# Patient Record
Sex: Male | Born: 1955 | Race: Black or African American | Hispanic: No | Marital: Married | Smoking: Never smoker
Health system: Southern US, Community
[De-identification: ages and names within clinical notes are randomized; demographics above are authoritative.]

## PROBLEM LIST (undated history)

## (undated) DIAGNOSIS — E538 Deficiency of other specified B group vitamins: Secondary | ICD-10-CM

## (undated) DIAGNOSIS — L80 Vitiligo: Secondary | ICD-10-CM

## (undated) DIAGNOSIS — I429 Cardiomyopathy, unspecified: Secondary | ICD-10-CM

## (undated) DIAGNOSIS — S4492XA Injury of unspecified nerve at shoulder and upper arm level, left arm, initial encounter: Secondary | ICD-10-CM

## (undated) DIAGNOSIS — J111 Influenza due to unidentified influenza virus with other respiratory manifestations: Secondary | ICD-10-CM

## (undated) DIAGNOSIS — E349 Endocrine disorder, unspecified: Secondary | ICD-10-CM

## (undated) DIAGNOSIS — I5020 Unspecified systolic (congestive) heart failure: Secondary | ICD-10-CM

## (undated) DIAGNOSIS — R3129 Other microscopic hematuria: Secondary | ICD-10-CM

## (undated) DIAGNOSIS — K4090 Unilateral inguinal hernia, without obstruction or gangrene, not specified as recurrent: Secondary | ICD-10-CM

## (undated) DIAGNOSIS — I639 Cerebral infarction, unspecified: Secondary | ICD-10-CM

## (undated) DIAGNOSIS — I10 Essential (primary) hypertension: Secondary | ICD-10-CM

## (undated) DIAGNOSIS — I5022 Chronic systolic (congestive) heart failure: Secondary | ICD-10-CM

## (undated) DIAGNOSIS — R319 Hematuria, unspecified: Secondary | ICD-10-CM

## (undated) DIAGNOSIS — I2510 Atherosclerotic heart disease of native coronary artery without angina pectoris: Secondary | ICD-10-CM

## (undated) DIAGNOSIS — I8390 Asymptomatic varicose veins of unspecified lower extremity: Secondary | ICD-10-CM

## (undated) DIAGNOSIS — Z7982 Long term (current) use of aspirin: Secondary | ICD-10-CM

## (undated) DIAGNOSIS — R001 Bradycardia, unspecified: Secondary | ICD-10-CM

## (undated) DIAGNOSIS — E785 Hyperlipidemia, unspecified: Secondary | ICD-10-CM

## (undated) DIAGNOSIS — K640 First degree hemorrhoids: Secondary | ICD-10-CM

## (undated) DIAGNOSIS — N529 Male erectile dysfunction, unspecified: Secondary | ICD-10-CM

## (undated) DIAGNOSIS — N200 Calculus of kidney: Secondary | ICD-10-CM

## (undated) DIAGNOSIS — Z87442 Personal history of urinary calculi: Secondary | ICD-10-CM

## (undated) DIAGNOSIS — I83892 Varicose veins of left lower extremities with other complications: Secondary | ICD-10-CM

## (undated) DIAGNOSIS — D72819 Decreased white blood cell count, unspecified: Secondary | ICD-10-CM

## (undated) DIAGNOSIS — I2089 Other forms of angina pectoris: Secondary | ICD-10-CM

## (undated) DIAGNOSIS — G4700 Insomnia, unspecified: Secondary | ICD-10-CM

## (undated) HISTORY — PX: COLONOSCOPY: SHX174

## (undated) HISTORY — PX: INGUINAL HERNIA REPAIR: SUR1180

## (undated) HISTORY — PX: HERNIA REPAIR: SHX51

---

## 2007-05-17 ENCOUNTER — Emergency Department: Payer: Self-pay | Admitting: Internal Medicine

## 2013-11-03 ENCOUNTER — Ambulatory Visit: Payer: Self-pay | Admitting: Surgery

## 2015-02-19 NOTE — Op Note (Signed)
PATIENT NAME:  LAWSON, MAHONE MR#:  [id number] DATE OF BIRTH:  06-06-1956  DATE OF PROCEDURE:  11/03/2013  PREOPERATIVE DIAGNOSIS: Left inguinal hernia.   POSTOPERATIVE DIAGNOSIS: Left inguinal hernia.   PROCEDURE: Left inguinal hernia repair.   SURGEON: Rochel Brome, M.D.   ANESTHESIA: General.   INDICATIONS: This is a 59 year old male who has recently had bulging in the left groin. A left inguinal hernia was demonstrated on physical exam.   The patient was placed on the operating table in the supine position under general anesthesia. The left lower quadrant of the abdomen was clipped and was prepared with ChloraPrep and draped in a sterile manner.   A transversely oriented left lower quadrant suprapubic incision was made, carried down through subcutaneous tissues. One traversing vein was clamped and ligated with 4-0 chromic. Next, further dissection was carried out with electrocautery. Several small bleeding points were cauterized. The Scarpa's fascia was incised. The external ring was identified. The external oblique aponeurosis was incised along the course of its fibers to open the external ring and expose the inguinal cord structures. The ilioinguinal nerve was seen coursing medially and was not in the way of the repair. Cord structures were mobilized. Cremaster fibers were spread to expose an indirect hernia sac which was approximately 7 cm in length and was dissected free from surrounding structures up into the internal ring. The sac was opened. Its continuity with the peritoneal cavity was demonstrated. A high ligation of the sac was done with a 0 Surgilon suture ligature. The sac was excised. The stump was allowed to retract. Next, repair was carried out with a row of 0 Surgilon sutures beginning at the pubic tubercle and carrying this up to the internal ring suturing the conjoined tendon to the shelving edge of the inguinal ligament incorporating transversalis fascia into the repair. The last  stitch led to satisfactory narrowing of the internal ring. Next, an onlay Bard soft mesh was cut to create an oval shape of some 2.5 x 3.5 cm with a notch cut out to straddle the internal ring. This was placed over the repair and sutured on both sides of the internal ring, sutured to the repair and also sutured medially to the fascia. The repair looked good. Hemostasis was intact. The ilioinguinal nerve was again recognized coursing medially. Next, the cut edges of the external oblique aponeurosis were closed with a running 4-0 Vicryl re-creating the external ring. The deep fascia superior and lateral to the repair site was infiltrated with 0.5% Sensorcaine with epinephrine. Subcutaneous tissues were infiltrated as well. There was at this point some bleeding from the vein, which was ligated at the beginning of the case and this was further suture ligated with 4-0 chromic and bleeding resolved. The subcutaneous tissues were also infiltrated with 0.5% Sensorcaine with epinephrine using a total of 15 mL the skin was closed with a running 4-0 Monocryl subcuticular suture and Dermabond. The patient tolerated surgery satisfactorily and was then prepared for transfer to the recovery room.     ____________________________ Lenna Sciara. Rochel Brome, MD [email or web address] D: 11/03/2013 09:00:26 ET T: 11/03/2013 09:28:55 ET JOB#: [id number]  cc: Loreli Dollar, MD, <Dictator> Loreli Dollar MD ELECTRONICALLY SIGNED 11/05/2013 19:51

## 2015-05-24 ENCOUNTER — Encounter: Payer: Self-pay | Admitting: *Deleted

## 2015-05-24 ENCOUNTER — Emergency Department: Payer: BLUE CROSS/BLUE SHIELD

## 2015-05-24 ENCOUNTER — Emergency Department
Admission: EM | Admit: 2015-05-24 | Discharge: 2015-05-24 | Disposition: A | Discharge status: Home / Self Care | Payer: BLUE CROSS/BLUE SHIELD | Attending: Emergency Medicine | Admitting: Emergency Medicine

## 2015-05-24 DIAGNOSIS — Y9389 Activity, other specified: Secondary | ICD-10-CM | POA: Insufficient documentation

## 2015-05-24 DIAGNOSIS — S0990XA Unspecified injury of head, initial encounter: Secondary | ICD-10-CM | POA: Insufficient documentation

## 2015-05-24 DIAGNOSIS — Y998 Other external cause status: Secondary | ICD-10-CM | POA: Diagnosis not present

## 2015-05-24 DIAGNOSIS — Y92410 Unspecified street and highway as the place of occurrence of the external cause: Secondary | ICD-10-CM | POA: Diagnosis not present

## 2015-05-24 DIAGNOSIS — S0093XA Contusion of unspecified part of head, initial encounter: Secondary | ICD-10-CM

## 2015-05-24 HISTORY — DX: Calculus of kidney: N20.0
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
## 2015-05-24 MED ORDER — IBUPROFEN 800 MG PO TABS: 800.0000 mg | ORAL_TABLET | Freq: Three times a day (TID) | ORAL | Status: DC | PRN

## 2015-05-24 MED ORDER — CYCLOBENZAPRINE HCL 10 MG PO TABS: 10.0000 mg | ORAL_TABLET | Freq: Three times a day (TID) | ORAL | Status: DC | PRN
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
## 2015-05-24 NOTE — ED Notes (Signed)
Patient with no complaints at this time. Respirations even and unlabored. Skin warm/dry. Discharge instructions reviewed with patient at this time. Patient given opportunity to voice concerns/ask questions. Patient discharged at this time and left Emergency Department with steady gait.   
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
## 2015-08-24 ENCOUNTER — Other Ambulatory Visit: Payer: Self-pay | Admitting: Internal Medicine

## 2015-08-24 DIAGNOSIS — R1031 Right lower quadrant pain: Secondary | ICD-10-CM

## 2015-08-30 ENCOUNTER — Ambulatory Visit
Admission: RE | Admit: 2015-08-30 | Discharge: 2015-08-30 | Disposition: A | Discharge status: Home / Self Care | Payer: BLUE CROSS/BLUE SHIELD | Source: Ambulatory Visit | Attending: Internal Medicine | Admitting: Internal Medicine

## 2015-08-30 DIAGNOSIS — R1031 Right lower quadrant pain: Secondary | ICD-10-CM

## 2015-08-30 DIAGNOSIS — K4090 Unilateral inguinal hernia, without obstruction or gangrene, not specified as recurrent: Secondary | ICD-10-CM | POA: Diagnosis not present

## 2015-08-30 IMAGING — CT CT ABD-PELV W/ CM
2 of 5 series · 16 of 46 positions shown, 18 images · IV contrast (omnipaque)
Comparison: None.

CLINICAL DATA: Right lower quadrant soreness x6 weeks, abdominal
bulge to right of umbilicus (worse with Valsalva), history of left
inguinal hernia repair

EXAM:
CT ABDOMEN AND PELVIS WITH CONTRAST
TECHNIQUE: Multidetector CT imaging of the abdomen and pelvis was performed
using the standard protocol following bolus administration of
intravenous contrast.
CONTRAST:  100mL OMNIPAQUE IOHEXOL 300 MG/ML  SOLN

[Series 2: routine with · axial · 0.75mm/px · z∈[-873,-478]mm · 13 of 89 slices shown, 15 images]
[im 5/89  soft-tissue]
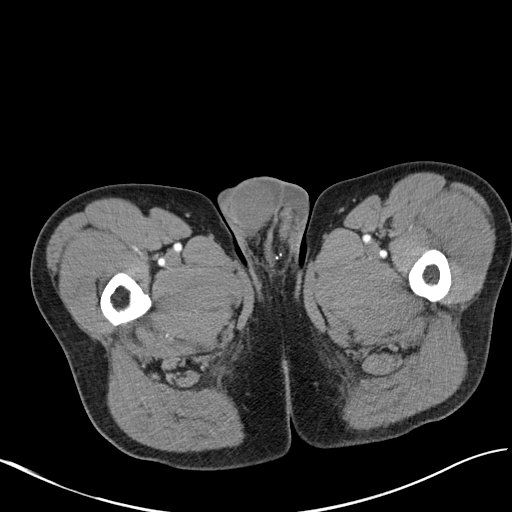
[im 5/89  bone]
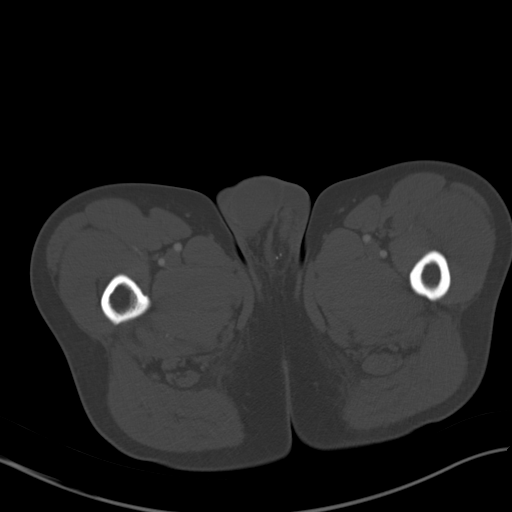
[im 14/89  soft-tissue]
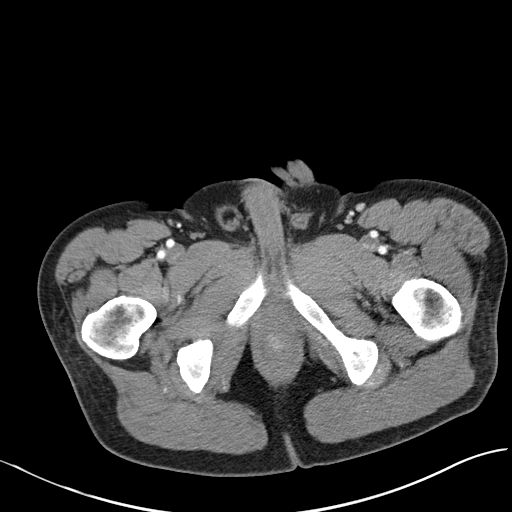
[im 19/89  soft-tissue]
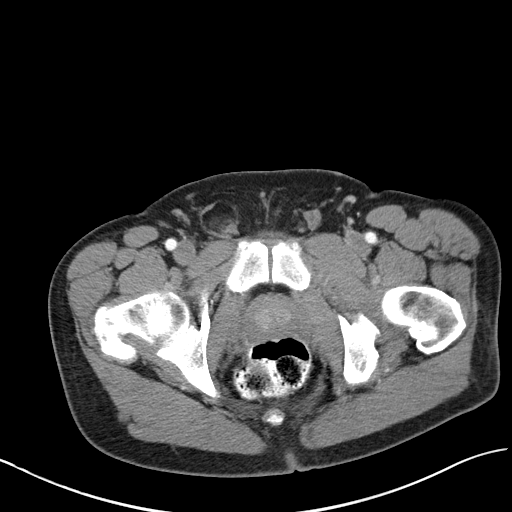
[im 24/89  soft-tissue]
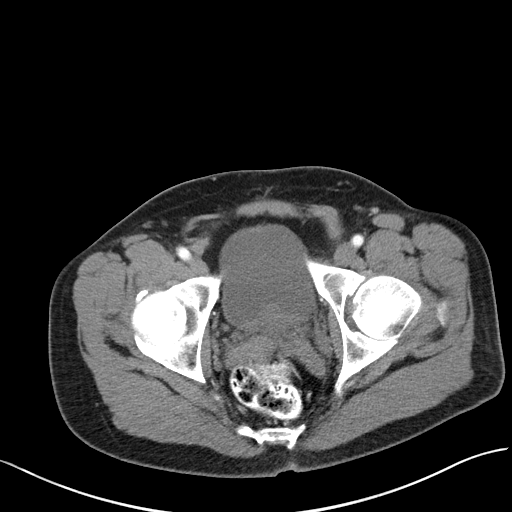
[im 33/89  soft-tissue]
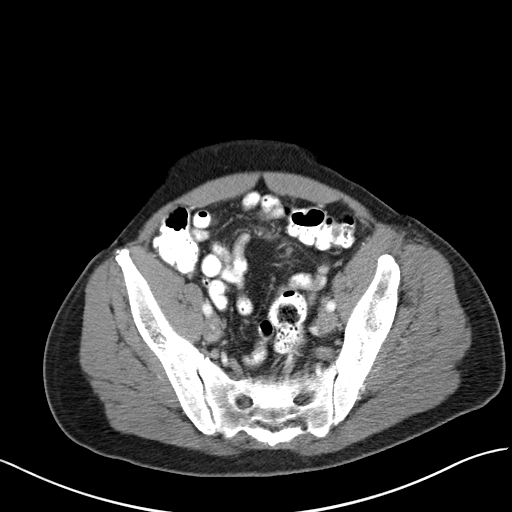
[im 38/89  soft-tissue]
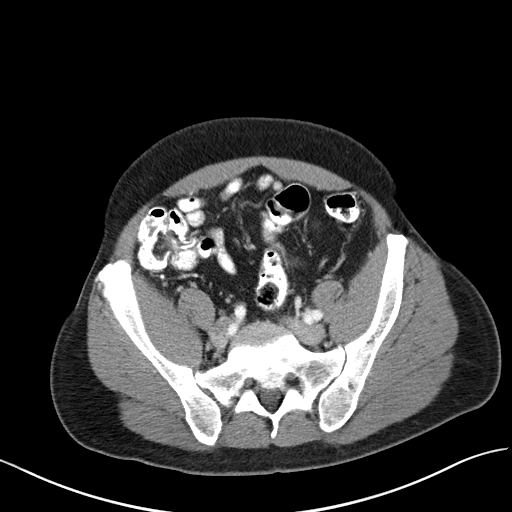
[im 47/89  soft-tissue]
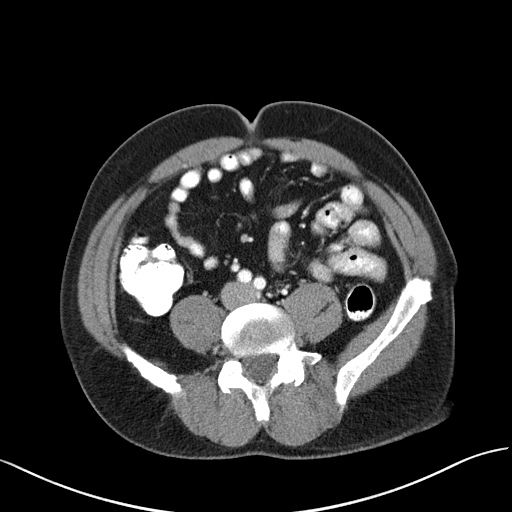
[im 51/89  soft-tissue]
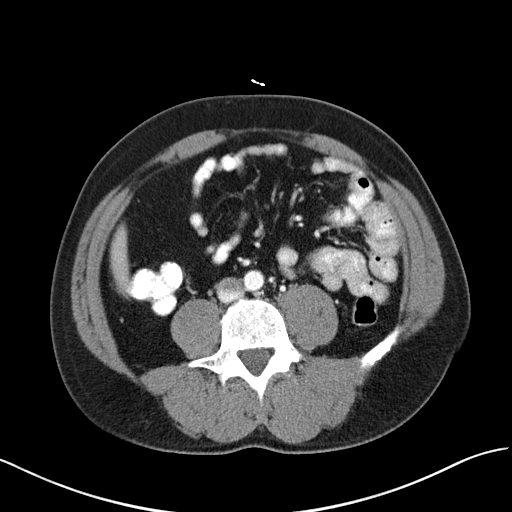
[im 56/89  soft-tissue]
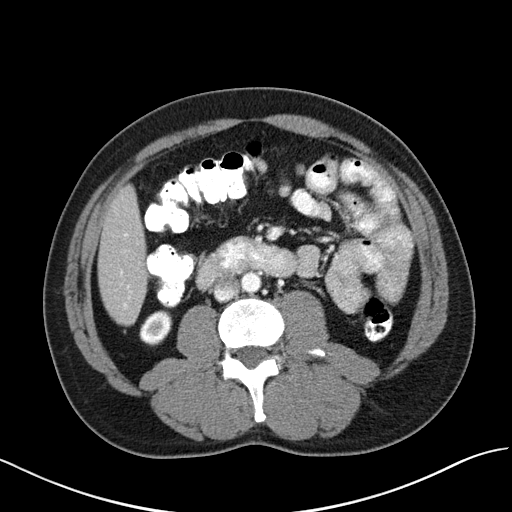
[im 56/89  bone]
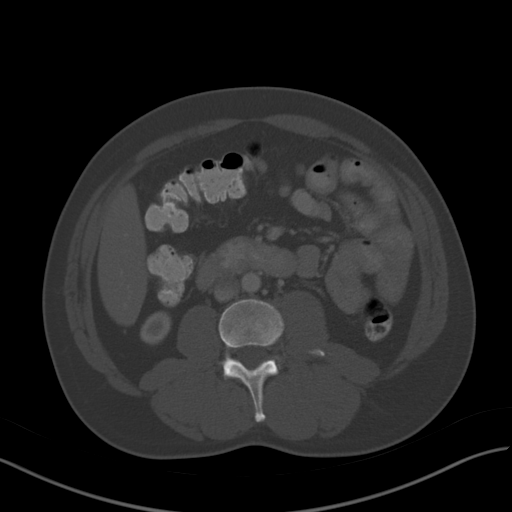
[im 65/89  soft-tissue]
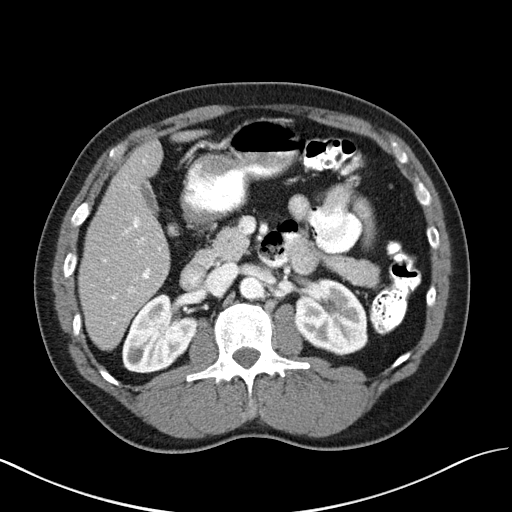
[im 70/89  soft-tissue]
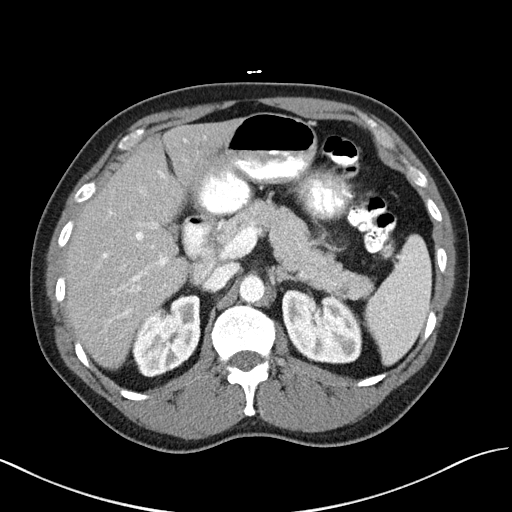
[im 75/89  soft-tissue]
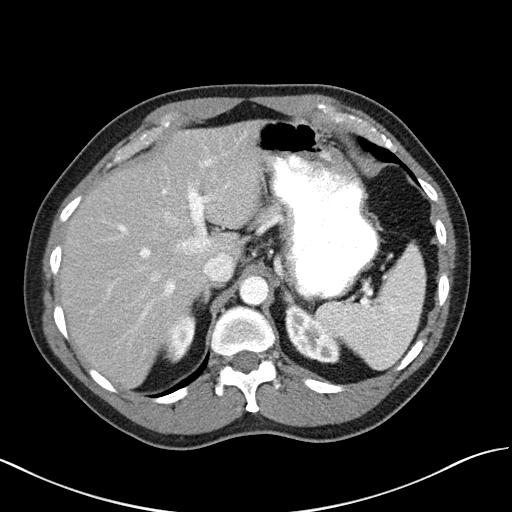
[im 84/89  soft-tissue]
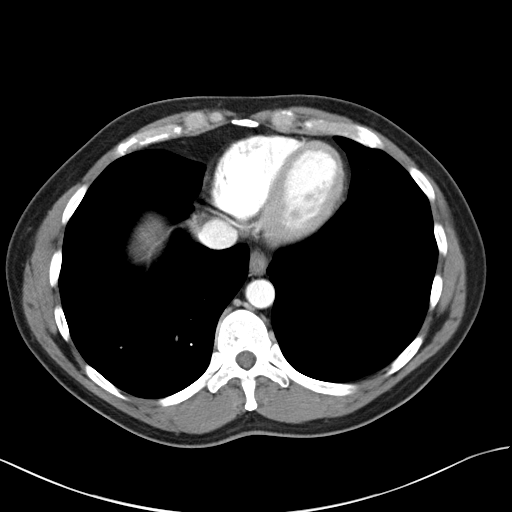

[Series 6: cor routine with · coronal · 0.76mm/px · 3 of 158 slices shown]
[im 53/158  soft-tissue]
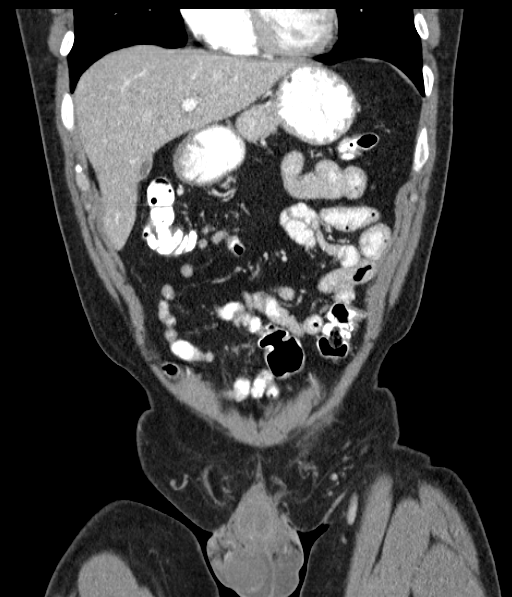
[im 70/158  soft-tissue]
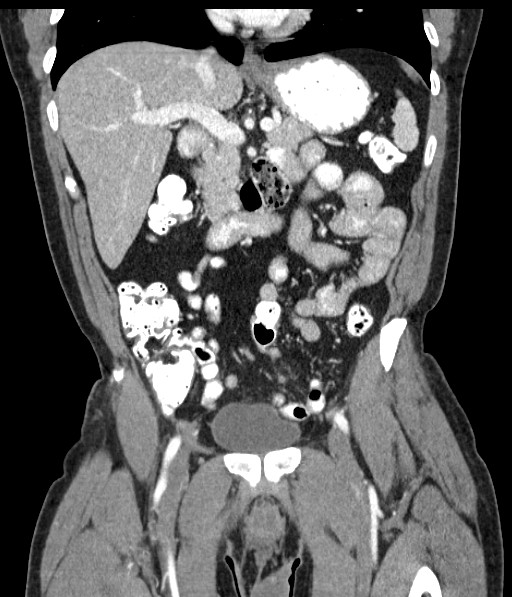
[im 88/158  soft-tissue]
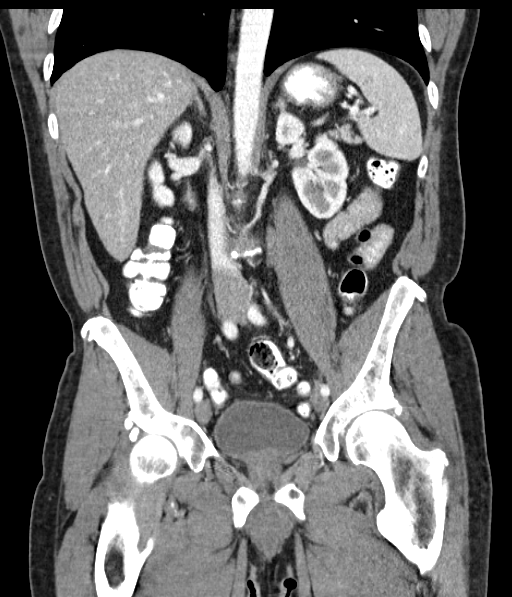

[16 of 46 positions shown; findings below may reference images not displayed]

FINDINGS: Lower chest:  Lung bases are clear.

Hepatobiliary: Liver is within normal limits.

Gallbladder is underdistended. No intrahepatic or extrahepatic
ductal dilatation.

Pancreas: Within normal limits.

Spleen: Within normal limits.

Adrenals/Urinary Tract: Adrenal glands are within normal limits.

12 mm cyst in the posterior interpolar right kidney (series 4/ image
18). Kidneys are otherwise within normal limits. No hydronephrosis.

Bladder is within normal limits.

Stomach/Bowel: Stomach is within normal limits.

No evidence of bowel obstruction.

Normal appendix.

Vascular/Lymphatic: No evidence of abdominal aortic aneurysm.

No suspicious abdominopelvic lymphadenopathy.

Reproductive: Prostate is unremarkable.

Other: No abdominopelvic ascites.

Small fat containing right inguinal hernia (series 2/ image 32).

Postsurgical changes related to prior left inguinal hernia repair.

Musculoskeletal: Visualized osseous structures are within normal
limits.
IMPRESSION: No evidence of bowel obstruction.  Normal appendix.

Small fat containing right inguinal hernia.

## 2015-08-30 MED ORDER — IOHEXOL 300 MG/ML  SOLN
100.0000 mL | Freq: Once | INTRAMUSCULAR | Status: AC | PRN
  Administered 2015-08-30: 100 mL via INTRAVENOUS

## 2015-09-02 DIAGNOSIS — K4090 Unilateral inguinal hernia, without obstruction or gangrene, not specified as recurrent: Secondary | ICD-10-CM

## 2015-09-02 HISTORY — DX: Unilateral inguinal hernia, without obstruction or gangrene, not specified as recurrent: K40.90

## 2015-09-29 DIAGNOSIS — J111 Influenza due to unidentified influenza virus with other respiratory manifestations: Secondary | ICD-10-CM

## 2015-09-29 HISTORY — DX: Influenza due to unidentified influenza virus with other respiratory manifestations: J11.1

## 2015-10-18 ENCOUNTER — Inpatient Hospital Stay: Admission: RE | Admit: 2015-10-18 | Payer: BLUE CROSS/BLUE SHIELD | Source: Ambulatory Visit

## 2015-10-18 NOTE — Patient Instructions (Signed)
  Your procedure is scheduled on: Friday Dec. 30, 2016. Report to Same Day Surgery. To find out your arrival time please call [phone] between 1PM - 3PM on Thursday Dec. 29, 2016 .  Remember: Instructions that are not followed completely may result in serious medical risk, up to and including death, or upon the discretion of your surgeon and anesthesiologist your surgery may need to be rescheduled.    _x___ 1. Do not eat food or drink liquids after midnight. No gum chewing or hard candies.     ____ 2. No Alcohol for 24 hours before or after surgery.   ____ 3. Bring all medications with you on the day of surgery if instructed.    __x__ 4. Notify your doctor if there is any change in your medical condition     (cold, fever, infections).     Do not wear jewelry, make-up, hairpins, clips or nail polish.  Do not wear lotions, powders, or perfumes. You may wear deodorant.  Do not shave 48 hours prior to surgery. Men may shave face and neck.  Do not bring valuables to the hospital.    [facility] is not responsible for any belongings or valuables.               Contacts, dentures or bridgework may not be worn into surgery.  Leave your suitcase in the car. After surgery it may be brought to your room.  For patients admitted to the hospital, discharge time is determined by your treatment team.   Patients discharged the day of surgery will not be allowed to drive home.    Please read over the following fact sheets that you were given:   [facility] [place] Preparing for Surgery  ____ Take these medicines the morning of surgery with A SIP OF WATER: NONE     ____ Fleet Enema (as directed)   ____ Use CHG Soap as directed  ____ Use inhalers on the day of surgery  ____ Stop metformin 2 days prior to surgery    ____ Take 1/2 of usual insulin dose the night before surgery and none on the morning of surgery.   ____ Stop Coumadin/Plavix/aspirin on does not apply.  _x___ Stop  Anti-inflammatories now until after surgery.  Tylenol is OK to take for pain.   ____ Stop supplements until after surgery.    ____ Bring C-Pap to the hospital.

## 2015-10-28 ENCOUNTER — Encounter: Admission: RE | Payer: Self-pay | Source: Ambulatory Visit

## 2015-10-28 ENCOUNTER — Ambulatory Visit: Admission: RE | Admit: 2015-10-28 | Payer: BLUE CROSS/BLUE SHIELD | Source: Ambulatory Visit | Admitting: Surgery

## 2015-10-28 HISTORY — DX: Unilateral inguinal hernia, without obstruction or gangrene, not specified as recurrent: K40.90

## 2015-10-28 HISTORY — DX: Hyperlipidemia, unspecified: E78.5

## 2015-10-28 SURGERY — REPAIR, HERNIA, INGUINAL, ADULT
Anesthesia: Choice | Laterality: Right

## 2015-11-04 ENCOUNTER — Other Ambulatory Visit: Payer: BLUE CROSS/BLUE SHIELD

## 2015-11-16 ENCOUNTER — Encounter: Payer: Self-pay | Admitting: *Deleted

## 2015-11-16 ENCOUNTER — Inpatient Hospital Stay: Admission: RE | Admit: 2015-11-16 | Payer: BLUE CROSS/BLUE SHIELD | Source: Ambulatory Visit

## 2015-12-23 ENCOUNTER — Ambulatory Visit
Admission: RE | Admit: 2015-12-23 | Discharge: 2015-12-23 | Disposition: A | Discharge status: Home / Self Care | Payer: BLUE CROSS/BLUE SHIELD | Source: Ambulatory Visit | Attending: Surgery | Admitting: Surgery

## 2015-12-23 ENCOUNTER — Encounter: Payer: Self-pay | Admitting: *Deleted

## 2015-12-23 ENCOUNTER — Encounter
Admission: RE | Disposition: A | Discharge status: Home / Self Care | Payer: Self-pay | Source: Ambulatory Visit | Attending: Surgery

## 2015-12-23 ENCOUNTER — Ambulatory Visit: Payer: BLUE CROSS/BLUE SHIELD | Admitting: Anesthesiology

## 2015-12-23 DIAGNOSIS — D176 Benign lipomatous neoplasm of spermatic cord: Secondary | ICD-10-CM | POA: Diagnosis not present

## 2015-12-23 DIAGNOSIS — K4090 Unilateral inguinal hernia, without obstruction or gangrene, not specified as recurrent: Secondary | ICD-10-CM | POA: Insufficient documentation

## 2015-12-23 DIAGNOSIS — E785 Hyperlipidemia, unspecified: Secondary | ICD-10-CM | POA: Diagnosis not present

## 2015-12-23 HISTORY — DX: Influenza due to unidentified influenza virus with other respiratory manifestations: J11.1

## 2015-12-23 HISTORY — PX: INGUINAL HERNIA REPAIR: SHX194

## 2015-12-23 SURGERY — REPAIR, HERNIA, INGUINAL, ADULT
Anesthesia: General | Site: Groin | Laterality: Right | Wound class: Clean

## 2015-12-23 MED ORDER — FAMOTIDINE 20 MG PO TABS
20.0000 mg | ORAL_TABLET | Freq: Once | ORAL | Status: AC
  Administered 2015-12-23: 20 mg via ORAL

## 2015-12-23 MED ORDER — BUPIVACAINE-EPINEPHRINE (PF) 0.5% -1:200000 IJ SOLN
INTRAMUSCULAR | Status: DC | PRN
Start: 2015-12-23 — End: 2015-12-23
  Administered 2015-12-23: 15 mL

## 2015-12-23 MED ORDER — HYDROCODONE-ACETAMINOPHEN 5-325 MG PO TABS: 1.0000 | ORAL_TABLET | ORAL | Status: DC | PRN

## 2015-12-23 MED ORDER — ONDANSETRON HCL 4 MG/2ML IJ SOLN
INTRAMUSCULAR | Status: DC | PRN
  Administered 2015-12-23: 4 mg via INTRAVENOUS

## 2015-12-23 MED ORDER — LIDOCAINE HCL (CARDIAC) 20 MG/ML IV SOLN
INTRAVENOUS | Status: DC | PRN
  Administered 2015-12-23: 30 mg via INTRAVENOUS

## 2015-12-23 MED ORDER — PROPOFOL 10 MG/ML IV BOLUS
INTRAVENOUS | Status: DC | PRN
  Administered 2015-12-23: 200 mg via INTRAVENOUS

## 2015-12-23 MED ORDER — ROCURONIUM BROMIDE 100 MG/10ML IV SOLN
INTRAVENOUS | Status: DC | PRN
  Administered 2015-12-23: 40 mg via INTRAVENOUS

## 2015-12-23 MED ORDER — FENTANYL CITRATE (PF) 100 MCG/2ML IJ SOLN
INTRAMUSCULAR | Status: DC | PRN
  Administered 2015-12-23 (×2): 100 ug via INTRAVENOUS

## 2015-12-23 MED ORDER — SUGAMMADEX SODIUM 200 MG/2ML IV SOLN
INTRAVENOUS | Status: DC | PRN
  Administered 2015-12-23: 158.8 mg via INTRAVENOUS

## 2015-12-23 MED ORDER — MIDAZOLAM HCL 2 MG/2ML IJ SOLN
INTRAMUSCULAR | Status: DC | PRN
  Administered 2015-12-23: 2 mg via INTRAVENOUS

## 2015-12-23 MED ORDER — CEFAZOLIN SODIUM-DEXTROSE 2-3 GM-% IV SOLR
2.0000 g | Freq: Once | INTRAVENOUS | Status: AC
  Administered 2015-12-23: 2 g via INTRAVENOUS

## 2015-12-23 MED ORDER — ONDANSETRON HCL 4 MG/2ML IJ SOLN: 4.0000 mg | Freq: Once | INTRAMUSCULAR | Status: DC | PRN

## 2015-12-23 MED ORDER — DEXAMETHASONE SODIUM PHOSPHATE 10 MG/ML IJ SOLN
INTRAMUSCULAR | Status: DC | PRN
  Administered 2015-12-23: 4 mg via INTRAVENOUS

## 2015-12-23 MED ORDER — LACTATED RINGERS IV SOLN
INTRAVENOUS | Status: DC
  Administered 2015-12-23 (×3): via INTRAVENOUS

## 2015-12-23 MED ORDER — FENTANYL CITRATE (PF) 100 MCG/2ML IJ SOLN
25.0000 ug | INTRAMUSCULAR | Status: DC | PRN
  Administered 2015-12-23 (×3): 25 ug via INTRAVENOUS

## 2015-12-23 MED ORDER — FENTANYL CITRATE (PF) 100 MCG/2ML IJ SOLN
INTRAMUSCULAR | Status: AC
  Filled 2015-12-23: qty 2

## 2015-12-23 SURGICAL SUPPLY — 25 items
BLADE SURG 15 STRL LF DISP TIS (BLADE) ×1 IMPLANT
BLADE SURG 15 STRL SS (BLADE) ×2
CANISTER SUCT 1200ML W/VALVE (MISCELLANEOUS) ×3 IMPLANT
CHLORAPREP W/TINT 26ML (MISCELLANEOUS) ×3 IMPLANT
DRAIN PENROSE 5/8X18 LTX STRL (WOUND CARE) ×3 IMPLANT
DRAPE LAPAROTOMY 77X122 PED (DRAPES) ×3 IMPLANT
ELECT REM PT RETURN 9FT ADLT (ELECTROSURGICAL) ×3
ELECTRODE REM PT RTRN 9FT ADLT (ELECTROSURGICAL) ×1 IMPLANT
GLOVE BIO SURGEON STRL SZ7.5 (GLOVE) ×9 IMPLANT
GOWN STRL REUS W/ TWL LRG LVL3 (GOWN DISPOSABLE) ×3 IMPLANT
GOWN STRL REUS W/TWL LRG LVL3 (GOWN DISPOSABLE) ×6
KIT RM TURNOVER STRD PROC AR (KITS) ×3 IMPLANT
LABEL OR SOLS (LABEL) IMPLANT
LIQUID BAND (GAUZE/BANDAGES/DRESSINGS) ×3 IMPLANT
MESH SYNTHETIC 4X6 SOFT BARD (Mesh General) ×1 IMPLANT
MESH SYNTHETIC SOFT BARD 4X6 (Mesh General) ×2 IMPLANT
NEEDLE HYPO 25X1 1.5 SAFETY (NEEDLE) ×3 IMPLANT
NS IRRIG 500ML POUR BTL (IV SOLUTION) ×3 IMPLANT
PACK BASIN MINOR ARMC (MISCELLANEOUS) ×3 IMPLANT
SUT CHROMIC 4 0 RB 1X27 (SUTURE) ×3 IMPLANT
SUT MNCRL AB 4-0 PS2 18 (SUTURE) IMPLANT
SUT SURGILON 0 30 BLK (SUTURE) ×6 IMPLANT
SUT VIC AB 4-0 SH 27 (SUTURE) ×4
SUT VIC AB 4-0 SH 27XANBCTRL (SUTURE) ×2 IMPLANT
SYRINGE 10CC LL (SYRINGE) ×3 IMPLANT

## 2015-12-23 NOTE — Transfer of Care (Signed)
Immediate Anesthesia Transfer of Care Note  Patient: Jimmy Montoya  Procedure(s) Performed: Procedure(s): HERNIA REPAIR INGUINAL ADULT (Right)  Patient Location: PACU  Anesthesia Type:General  Level of Consciousness: sedated  Airway & Oxygen Therapy: Patient Spontanous Breathing and Patient connected to face mask oxygen  Post-op Assessment: Report given to RN and Post -op Vital signs reviewed and stable  Post vital signs: Reviewed and stable  Last Vitals:  Filed Vitals:   12/23/15 1300  BP: 133/90  Pulse: 79  Temp: 36.7 C  Resp: 16    Complications: No apparent anesthesia complications

## 2015-12-23 NOTE — H&P (Signed)
Jimmy Montoya is an 60 y.o. male.   Chief Complaint: Bulging in the right groin HPI: He reports several months history of discomfort in the right groin with bulging. CT scan demonstrated fat-containing right inguinal hernia.  Past Medical History  Diagnosis Date  . Hernia, inguinal, right 09/02/2015  . Hyperlipemia   . Kidney stone   . Flu DEC 2016    Past Surgical History  Procedure Laterality Date  . Hernia repair      History reviewed. No pertinent family history. Social History:  reports that he has never smoked. He does not have any smokeless tobacco history on file. He reports that he does not drink alcohol or use illicit drugs.  Allergies: No Known Allergies  Medications Prior to Admission  Medication Sig Dispense Refill  . DM-Doxylamine-Acetaminophen (NYQUIL COLD & FLU PO) Take by mouth as needed.      No results found for this or any previous visit (from the past 48 hour(s)). No results found.  ROS he had recent flu and cold and his surgery was delayed. He reports his symptoms have resolved.  Blood pressure 133/90, pulse 79, temperature 98.1 F (36.7 C), temperature source Oral, resp. rate 16, height 5\' 7"  (1.702 m), weight 79.379 kg (175 lb), SpO2 99 %. Physical Exam  GENERAL:  Awake alert and oriented and in no acute distress.  HEENT:  Head is normocephalic.  Pupils are equal reactive to light.  Extraocular movements are intact. Sclera is clear.  Pharynx is clear.  LUNGS:  Clear without rales rhonchi or wheezes.  HEART:  Regular rhythm S1-S2, without murmur.  ABDOMEN: The site of the right inguinal hernia was demonstrated in the right side was marked YES   NEUROLOGICAL: Awake alert and oriented and moving all extremities  EXTREMITIES: No dependent edema.  Assessment/Plan Right inguinal hernia  I discussed the plan for right inguinal hernia repair  Rochel Brome, MD 12/23/2015, 2:05 PM   2

## 2015-12-23 NOTE — Op Note (Signed)
OPERATIVE REPORT  PREOPERATIVE DIAGNOSIS: right inguinal hernia  POSTOPERATIVE DIAGNOSIS:right  inguinal hernia  PROCEDURE:  right inguinal hernia repair  ANESTHESIA:  General  SURGEON:  Rochel Brome M.D.  INDICATIONS: He has a history of bulging and moderate discomfort in the right groin. A right inguinal hernia was demonstrated on CT and also demonstrated on physical exam and repair was recommended for definitive treatment.  With the patient on the operating table in the supine position the right lower quadrant was prepared with clippers and with ChloraPrep and draped in a sterile manner. A transversely oriented suprapubic incision was made and carried down through subcutaneous tissues. Electrocautery was used for hemostasis. The Scarpa's fascia was incised. The external oblique aponeurosis was incised along the course of its fibers to open the external ring and expose the inguinal cord structures. The cord structures were mobilized. A Penrose drain was passed around the cord structures for traction. Cremaster fibers were separated to expose an indirect hernia sac which was some 4 cm in length and was dissected free from surrounding structures up to the internal ring. A high ligation of the sac was done with 4-0 Vicryl suture ligature the sac was amputated and the stump allowed to retract. A cord lipoma was also dissected free from surrounding structures up into the internal ring where it was ligated doubly with 4-0 Vicryl and amputated. Tissues were not submitted for pathology.  Bard soft mesh was cut to create an oval shape and was placed over the repair. This was sutured to the repair with interrupted 0 Surgilon sutures and also sutured medially to the deep fascia and on both sides of the internal ring. Next after seeing hemostasis was intact the cord structures were replaced along the floor of the inguinal canal. The cut edges of the external oblique aponeurosis were closed with a running 4-0  Vicryl suture to re-create the external ring. The deep fascia superior and lateral to the repair site was infiltrated with half percent Sensorcaine with epinephrine. Subcutaneous tissues were also infiltrated. The Scarpa's fascia was closed with interrupted 4-0 Vicryl sutures. The skin was closed with running 4-0 Monocryl subcuticular suture and LiquiBand. The testicle remained in the scrotum  The patient appeared to be in satisfactory condition and was prepared for transfer to the recovery room.  Rochel Brome M.D.

## 2015-12-23 NOTE — Progress Notes (Signed)
Pt informed of loss of incisor during intubation.  Informed that tooth was saved and placed into milk solution.  Pt understands and is not surprised.  Tolerated information well

## 2015-12-23 NOTE — Discharge Instructions (Addendum)
Take Tylenol or Norco if needed for pain.  May shower.  Avoid straining and heavy lifting.  General Anesthesia, Adult General anesthesia is a sleep-like state of non-feeling produced by medicines (anesthetics). General anesthesia prevents you from being alert and feeling pain during a medical procedure. Your caregiver may recommend general anesthesia if your procedure:  Is long.  Is painful or uncomfortable.  Would be frightening to see or hear.  Requires you to be still.  Affects your breathing.  Causes significant blood loss. LET YOUR CAREGIVER KNOW ABOUT:  Allergies to food or medicine.  Medicines taken, including vitamins, herbs, eyedrops, over-the-counter medicines, and creams.  Use of steroids (by mouth or creams).  Previous problems with anesthetics or numbing medicines, including problems experienced by relatives.  History of bleeding problems or blood clots.  Previous surgeries and types of anesthetics received.  Possibility of pregnancy, if this applies.  Use of cigarettes, alcohol, or illegal drugs.  Any health condition(s), especially diabetes, sleep apnea, and high blood pressure. RISKS AND COMPLICATIONS General anesthesia rarely causes complications. However, if complications do occur, they can be life threatening. Complications include:  A lung infection.  A stroke.  A heart attack.  Waking up during the procedure. When this occurs, the patient may be unable to move and communicate that he or she is awake. The patient may feel severe pain. Older adults and adults with serious medical problems are more likely to have complications than adults who are young and healthy. Some complications can be prevented by answering all of your caregiver's questions thoroughly and by following all pre-procedure instructions. It is important to tell your caregiver if any of the pre-procedure instructions, especially those related to diet, were not followed. Any food or  liquid in the stomach can cause problems when you are under general anesthesia. BEFORE THE PROCEDURE  Ask your caregiver if you will have to spend the night at the hospital. If you will not have to spend the night, arrange to have an adult drive you and stay with you for 24 hours.  Follow your caregiver's instructions if you are taking dietary supplements or medicines. Your caregiver may tell you to stop taking them or to reduce your dosage.  Do not smoke for as long as possible before your procedure. If possible, stop smoking 3-6 weeks before the procedure.  Do not take new dietary supplements or medicines within 1 week of your procedure unless your caregiver approves them.  Do not eat within 8 hours of your procedure or as directed by your caregiver. Drink only clear liquids, such as water, black coffee (without milk or cream), and fruit juices (without pulp).  Do not drink within 3 hours of your procedure or as directed by your caregiver.  You may brush your teeth on the morning of the procedure, but make sure to spit out the toothpaste and water when finished. PROCEDURE  You will receive anesthetics through a mask, through an intravenous (IV) access tube, or through both. A doctor who specializes in anesthesia (anesthesiologist) or a nurse who specializes in anesthesia (nurse anesthetist) or both will stay with you throughout the procedure to make sure you remain unconscious. He or she will also watch your blood pressure, pulse, and oxygen levels to make sure that the anesthetics do not cause any problems. Once you are asleep, a breathing tube or mask may be used to help you breathe. AFTER THE PROCEDURE You will wake up after the procedure is complete. You may be in  the room where the procedure was performed or in a recovery area. You may have a sore throat if a breathing tube was used. You may also feel:  Dizzy.  Weak.  Drowsy.  Confused.  Nauseous.  Cold. These are all normal  responses and can be expected to last for up to 24 hours after the procedure is complete. A caregiver will tell you when you are ready to go home. This will usually be when you are fully awake and in stable condition.   This information is not intended to replace advice given to you by your health care provider. Make sure you discuss any questions you have with your health care provider.   Document Released: 01/22/2008 Document Revised: 11/05/2014 Document Reviewed: 02/13/2012 Elsevier Interactive Patient Education [employer].

## 2015-12-23 NOTE — Anesthesia Preprocedure Evaluation (Addendum)
Anesthesia Evaluation  Patient identified by MRN, date of birth, ID band  Reviewed: Allergy & Precautions, NPO status , Patient's Chart, lab work & pertinent test results  Airway Mallampati: II  TM Distance: >3 FB Neck ROM: Full    Dental  (+) Chipped, Poor Dentition, Loose, Dental Advisory Given,    Pulmonary  Flu back in December has since resolved   Pulmonary exam normal breath sounds clear to auscultation       Cardiovascular Normal cardiovascular exam     Neuro/Psych negative neurological ROS  negative psych ROS   GI/Hepatic Neg liver ROS, Inguinal hernia   Endo/Other  negative endocrine ROS  Renal/GU Stone Hx     Musculoskeletal negative musculoskeletal ROS (+)   Abdominal Normal abdominal exam  (+)   Peds  Hematology negative hematology ROS (+)   Anesthesia Other Findings Increased lipids  Reproductive/Obstetrics                            Anesthesia Physical Anesthesia Plan  ASA: II  Anesthesia Plan: General   Post-op Pain Management:    Induction: Intravenous  Airway Management Planned: Oral ETT  Additional Equipment:   Intra-op Plan:   Post-operative Plan: Extubation in OR  Informed Consent: I have reviewed the patients History and Physical, chart, labs and discussed the procedure including the risks, benefits and alternatives for the proposed anesthesia with the patient or authorized representative who has indicated his/her understanding and acceptance.   Dental advisory given  Plan Discussed with: CRNA and Surgeon  Anesthesia Plan Comments:         Anesthesia Quick Evaluation

## 2015-12-23 NOTE — Anesthesia Procedure Notes (Signed)
Procedure Name: Intubation Date/Time: 12/23/2015 2:35 PM Performed by: Sinda Du Pre-anesthesia Checklist: Patient identified, Patient being monitored, Timeout performed, Emergency Drugs available and Suction available Patient Re-evaluated:Patient Re-evaluated prior to inductionOxygen Delivery Method: Circle system utilized Preoxygenation: Pre-oxygenation with 100% oxygen Intubation Type: IV induction Ventilation: Mask ventilation without difficulty Laryngoscope Size: Mac, 3 and Glidescope Grade View: Grade III Tube type: Oral Tube size: 7.0 mm Number of attempts: 2 Airway Equipment and Method: Stylet and Video-laryngoscopy Placement Confirmation: ETT inserted through vocal cords under direct vision,  positive ETCO2 and breath sounds checked- equal and bilateral Secured at: 21 cm Tube secured with: Tape Dental Injury: Dental damage  Comments: When scissoring the mouth open.  Left front incisor dislodged and fell into mouth.  Removed with  Forceps from back of pharynx.  Assessed other teeth.  Very poor dentention and front teeth are very loose.  Switched to [place] and intubated avoiding touching any other front teeth with fingers.

## 2015-12-23 NOTE — OR Nursing (Signed)
During intubation, front tooth became dislodged. Tooth was recovered and sent to PACU with patient. Upon inspection,other teeth were visibly loose. Anesthesiologist present and MD aware.

## 2015-12-27 ENCOUNTER — Encounter: Payer: Self-pay | Admitting: Surgery

## 2015-12-27 NOTE — Anesthesia Postprocedure Evaluation (Signed)
Anesthesia Post Note  Patient: Jimmy Montoya  Procedure(s) Performed: Procedure(s) (LRB): HERNIA REPAIR INGUINAL ADULT (Right)  Patient location during evaluation: PACU Anesthesia Type: General Level of consciousness: awake and alert Pain management: pain level controlled Vital Signs Assessment: post-procedure vital signs reviewed and stable Respiratory status: spontaneous breathing, nonlabored ventilation, respiratory function stable and patient connected to nasal cannula oxygen Cardiovascular status: blood pressure returned to baseline and stable Postop Assessment: no signs of nausea or vomiting Anesthetic complications: no    Last Vitals:  Filed Vitals:   12/23/15 1709 12/23/15 1725  BP: 146/84 131/92  Pulse: 69 69  Temp: 36.1 C   Resp: 18 16    Last Pain:  Filed Vitals:   12/26/15 0821  PainSc: 3                  Martha Clan

## 2016-12-20 ENCOUNTER — Encounter: Payer: Self-pay | Admitting: Surgery

## 2020-10-29 DIAGNOSIS — I639 Cerebral infarction, unspecified: Secondary | ICD-10-CM

## 2020-10-29 HISTORY — DX: Cerebral infarction, unspecified: I63.9

## 2021-03-20 ENCOUNTER — Encounter: Payer: Self-pay | Admitting: *Deleted

## 2021-03-20 ENCOUNTER — Emergency Department: Payer: BC Managed Care – PPO

## 2021-03-20 ENCOUNTER — Other Ambulatory Visit: Payer: Self-pay

## 2021-03-20 ENCOUNTER — Encounter: Payer: Self-pay | Admitting: Emergency Medicine

## 2021-03-20 ENCOUNTER — Ambulatory Visit
Admission: EM | Admit: 2021-03-20 | Discharge: 2021-03-20 | Disposition: A | Discharge status: Home / Self Care | Payer: BC Managed Care – PPO | Attending: Physician Assistant | Admitting: Physician Assistant

## 2021-03-20 ENCOUNTER — Observation Stay: Payer: BC Managed Care – PPO

## 2021-03-20 ENCOUNTER — Observation Stay
Admission: EM | Admit: 2021-03-20 | Discharge: 2021-03-21 | Disposition: A | Discharge status: Home / Self Care | Payer: BC Managed Care – PPO | Attending: Internal Medicine | Admitting: Internal Medicine

## 2021-03-20 DIAGNOSIS — I10 Essential (primary) hypertension: Secondary | ICD-10-CM | POA: Diagnosis not present

## 2021-03-20 DIAGNOSIS — Z20822 Contact with and (suspected) exposure to covid-19: Secondary | ICD-10-CM | POA: Diagnosis not present

## 2021-03-20 DIAGNOSIS — I639 Cerebral infarction, unspecified: Principal | ICD-10-CM | POA: Insufficient documentation

## 2021-03-20 DIAGNOSIS — I160 Hypertensive urgency: Secondary | ICD-10-CM | POA: Diagnosis not present

## 2021-03-20 DIAGNOSIS — D649 Anemia, unspecified: Secondary | ICD-10-CM | POA: Diagnosis not present

## 2021-03-20 DIAGNOSIS — Y900 Blood alcohol level of less than 20 mg/100 ml: Secondary | ICD-10-CM | POA: Diagnosis not present

## 2021-03-20 DIAGNOSIS — E785 Hyperlipidemia, unspecified: Secondary | ICD-10-CM

## 2021-03-20 DIAGNOSIS — R200 Anesthesia of skin: Secondary | ICD-10-CM

## 2021-03-20 DIAGNOSIS — R29818 Other symptoms and signs involving the nervous system: Secondary | ICD-10-CM

## 2021-03-20 DIAGNOSIS — R202 Paresthesia of skin: Secondary | ICD-10-CM | POA: Diagnosis not present

## 2021-03-20 DIAGNOSIS — I248 Other forms of acute ischemic heart disease: Secondary | ICD-10-CM | POA: Diagnosis not present

## 2021-03-20 DIAGNOSIS — R9431 Abnormal electrocardiogram [ECG] [EKG]: Secondary | ICD-10-CM

## 2021-03-20 DIAGNOSIS — M795 Residual foreign body in soft tissue: Secondary | ICD-10-CM

## 2021-03-20 DIAGNOSIS — E876 Hypokalemia: Secondary | ICD-10-CM

## 2021-03-20 HISTORY — DX: Anemia, unspecified: D64.9

## 2021-03-20 LAB — ETHANOL: Alcohol, Ethyl (B): <10 mg/dL (ref <10)

## 2021-03-20 LAB — CBC
HCT: 33.6 % — ABNORMAL LOW (ref 39.0–52.0)
Hemoglobin: 11.3 g/dL — ABNORMAL LOW (ref 13.0–17.0)
MCH: 29.5 pg (ref 26.0–34.0)
MCHC: 33.6 g/dL (ref 30.0–36.0)
MCV: 87.7 fL (ref 80.0–100.0)
Platelets: 146 10*3/uL — ABNORMAL LOW (ref 150–400)
RBC: 3.83 MIL/uL — ABNORMAL LOW (ref 4.22–5.81)
RDW: 11.7 % (ref 11.5–15.5)
WBC: 4.2 10*3/uL (ref 4.0–10.5)
nRBC: 0 % (ref 0.0–0.2)

## 2021-03-20 LAB — DIFFERENTIAL
Abs Immature Granulocytes: 0.01 10*3/uL (ref 0.00–0.07)
Basophils Absolute: 0 10*3/uL (ref 0.0–0.1)
Basophils Relative: 1 %
Eosinophils Absolute: 0.1 10*3/uL (ref 0.0–0.5)
Eosinophils Relative: 2 %
Immature Granulocytes: 0 %
Lymphocytes Relative: 35 %
Lymphs Abs: 1.5 10*3/uL (ref 0.7–4.0)
Monocytes Absolute: 0.3 10*3/uL (ref 0.1–1.0)
Monocytes Relative: 6 %
Neutro Abs: 2.4 10*3/uL (ref 1.7–7.7)
Neutrophils Relative %: 56 %

## 2021-03-20 LAB — MAGNESIUM: Magnesium: 1.4 mg/dL — ABNORMAL LOW (ref 1.7–2.4)

## 2021-03-20 LAB — COMPREHENSIVE METABOLIC PANEL
ALT: 18 U/L (ref 0–44)
AST: 24 U/L (ref 15–41)
Albumin: 4.2 g/dL (ref 3.5–5.0)
Alkaline Phosphatase: 89 U/L (ref 38–126)
Anion gap: 8 (ref 5–15)
BUN: 12 mg/dL (ref 8–23)
CO2: 28 mmol/L (ref 22–32)
Calcium: 8.9 mg/dL (ref 8.9–10.3)
Chloride: 103 mmol/L (ref 98–111)
Creatinine, Ser: 1.02 mg/dL (ref 0.61–1.24)
GFR, Estimated: >60 mL/min (ref >60)
Glucose, Bld: 100 mg/dL — ABNORMAL HIGH (ref 70–99)
Potassium: 3.3 mmol/L — ABNORMAL LOW (ref 3.5–5.1)
Sodium: 139 mmol/L (ref 135–145)
Total Bilirubin: 0.7 mg/dL (ref 0.3–1.2)
Total Protein: 7.4 g/dL (ref 6.5–8.1)

## 2021-03-20 LAB — PROTIME-INR
INR: 1 (ref 0.8–1.2)
Prothrombin Time: 13.3 seconds (ref 11.4–15.2)

## 2021-03-20 LAB — RESP PANEL BY RT-PCR (FLU A&B, COVID) ARPGX2
Influenza A by PCR: NEGATIVE
Influenza B by PCR: NEGATIVE
SARS Coronavirus 2 by RT PCR: NEGATIVE

## 2021-03-20 LAB — TROPONIN I (HIGH SENSITIVITY)
Troponin I (High Sensitivity): 22 ng/L — ABNORMAL HIGH (ref <18)
Troponin I (High Sensitivity): 28 ng/L — ABNORMAL HIGH (ref <18)

## 2021-03-20 LAB — APTT: aPTT: 32 seconds (ref 24–36)

## 2021-03-20 IMAGING — CT CT HEAD CODE STROKE
2 series · 15 of 38 positions shown, 18 images · non-contrast
Comparison: [PI]

CLINICAL DATA: Code stroke. Lid tingling, left arm and leg tingling

EXAM:
CT HEAD WITHOUT CONTRAST
TECHNIQUE: Contiguous axial images were obtained from the base of the skull
through the vertex without intravenous contrast.

[Series 5: coronal soft tissue · coronal · 0.33mm/px · 12 of 74 slices shown, 15 images]
[im 5/74  brain]
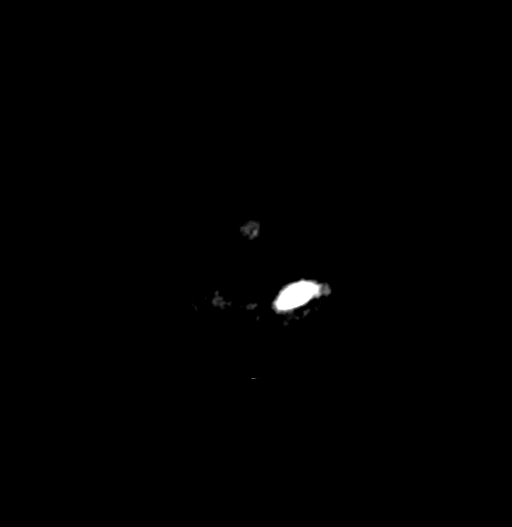
[im 5/74  bone]
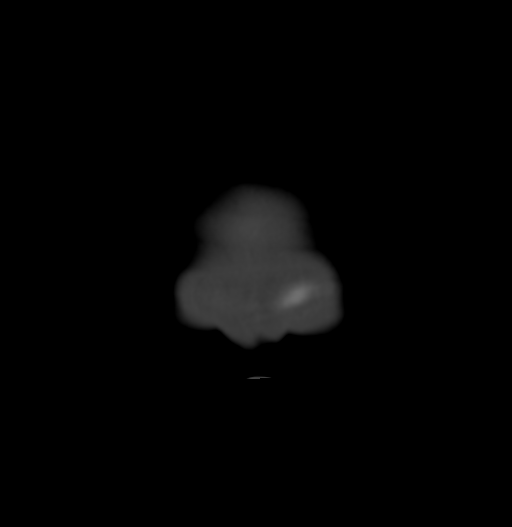
[im 10/74  brain]
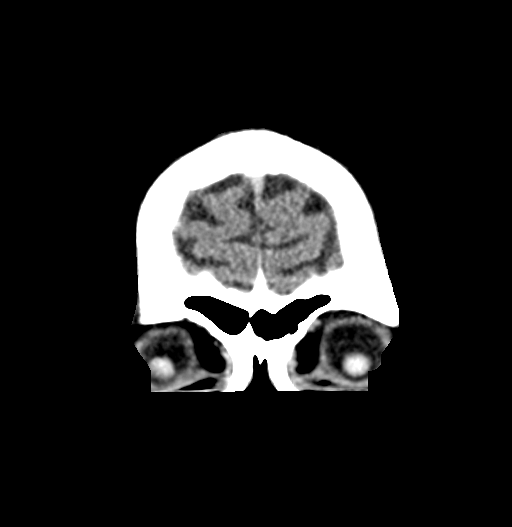
[im 17/74  brain]
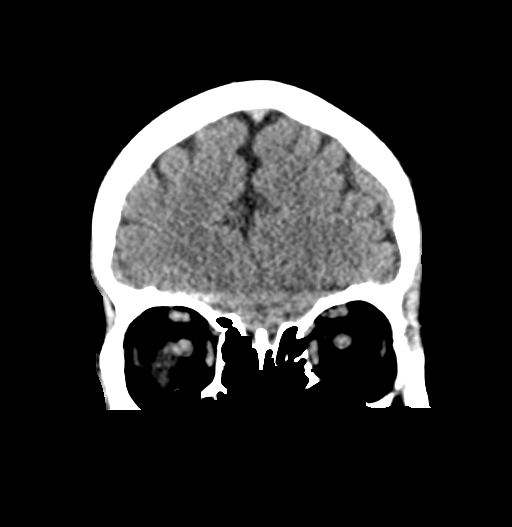
[im 23/74  brain]
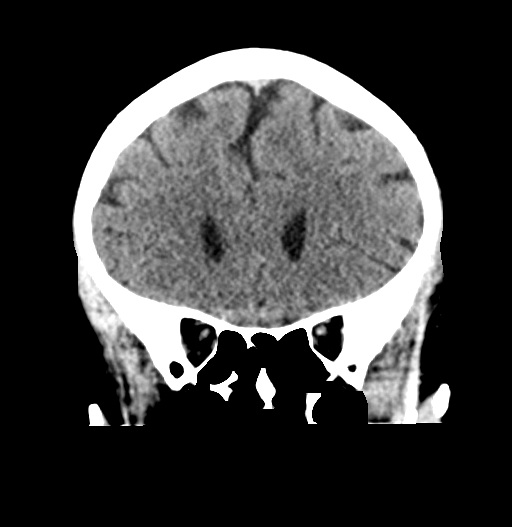
[im 28/74  brain]
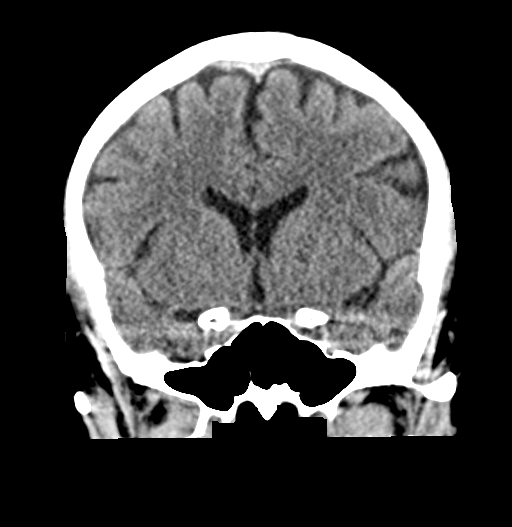
[im 28/74  bone]
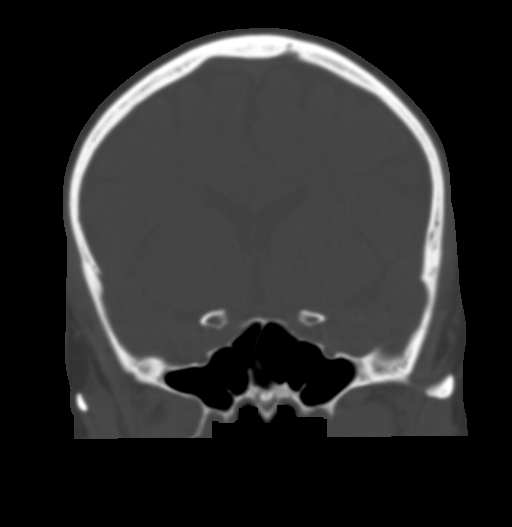
[im 33/74  brain]
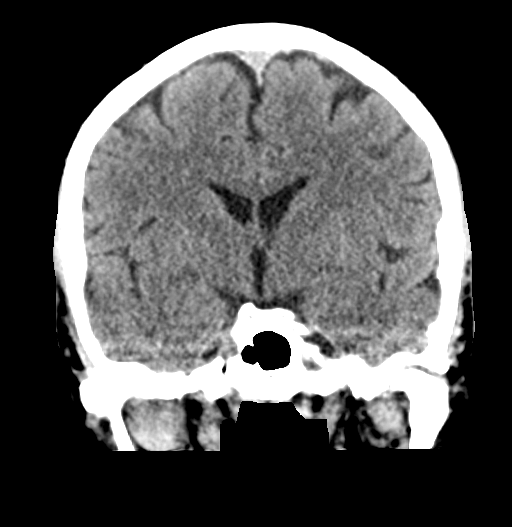
[im 41/74  brain]
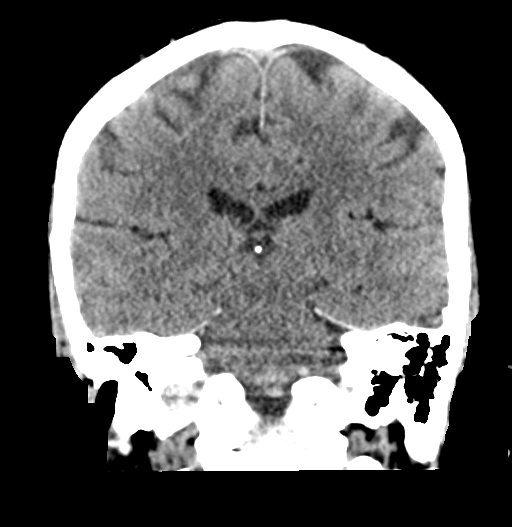
[im 46/74  brain]
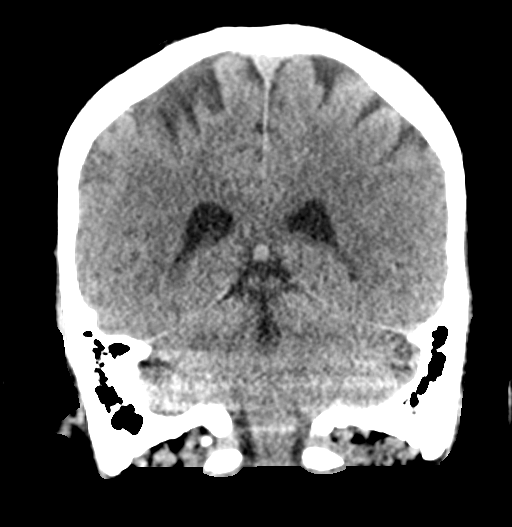
[im 51/74  brain]
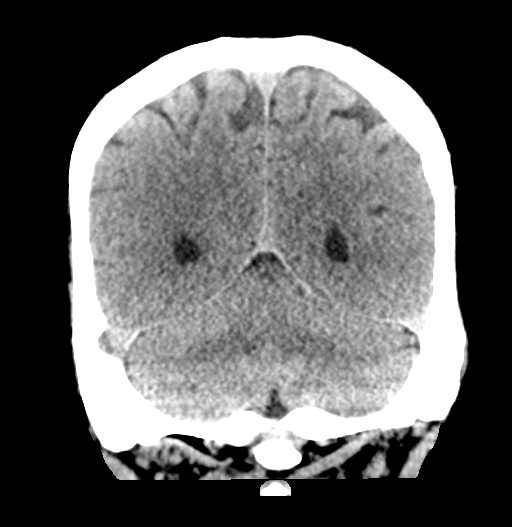
[im 51/74  bone]
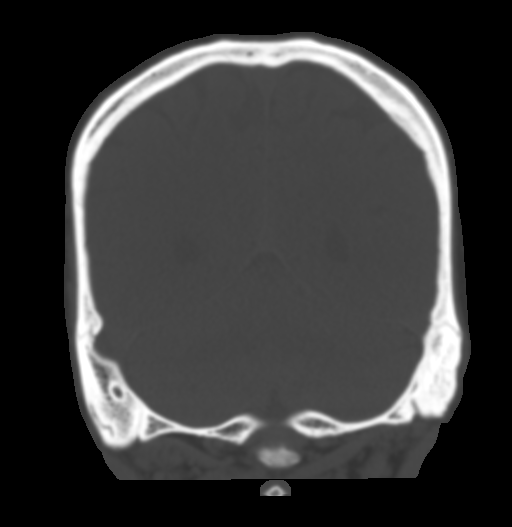
[im 57/74  brain]
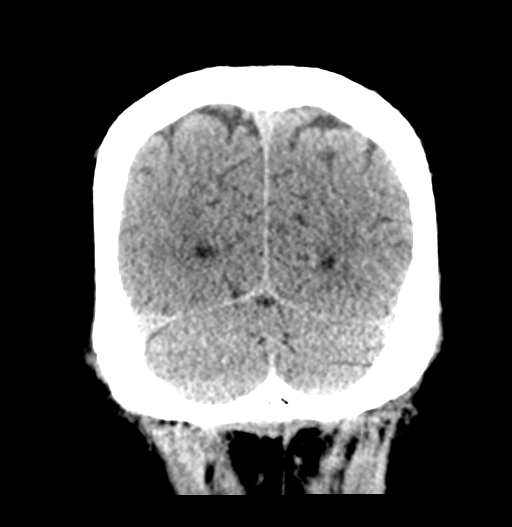
[im 64/74  brain]
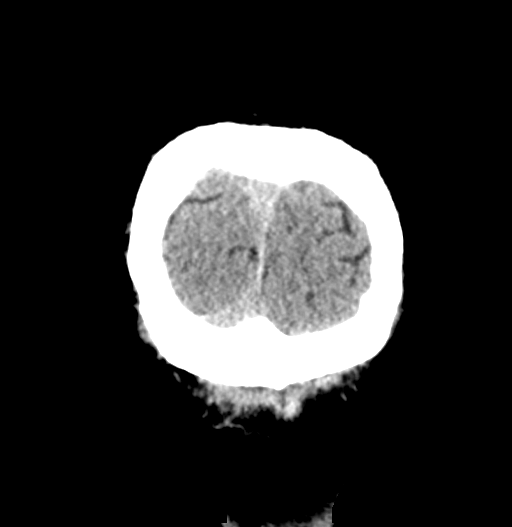
[im 69/74  brain]
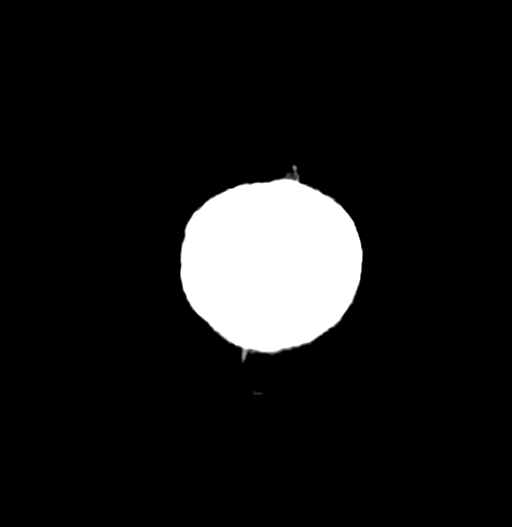

[Series 6: sagittal soft tissue · sagittal · 0.33mm/px · 3 of 57 slices shown]
[im 24/57  brain]
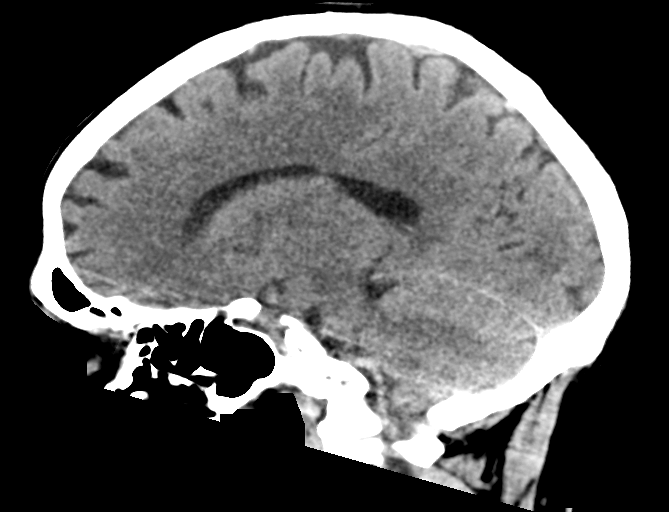
[im 29/57  brain]
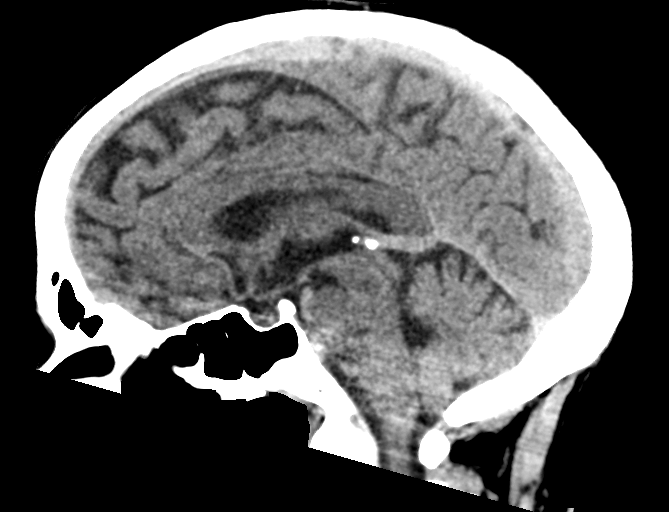
[im 33/57  brain]
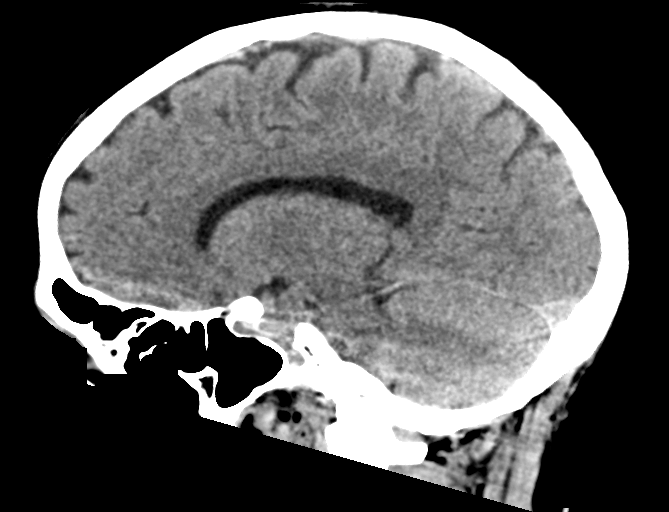

[15 of 38 positions shown; findings below may reference images not displayed]

FINDINGS: Brain: There is no acute intracranial hemorrhage, mass effect, or
edema. Gray-white differentiation is preserved. Ventricles and sulci
are normal in size and configuration.

Vascular: No hyperdense vessel.

Skull: Unremarkable.

Sinuses/Orbits: Aerated.

Other: Mastoid air cells are clear.

ASPECTS (Alberta Stroke Program Early CT Score)

- Ganglionic level infarction (caudate, lentiform nuclei, internal
capsule, insula, M1-M3 cortex): 7

- Supraganglionic infarction (M4-M6 cortex): 3

Total score (0-10 with 10 being normal): 10
IMPRESSION: There is no acute intracranial hemorrhage or evidence of acute
infarction. ASPECT score is 10.

These results were called by telephone at the time of interpretation
on [DATE] at [DATE] to provider KAVIR , who verbally
acknowledged these results.

## 2021-03-20 IMAGING — CR DG ORBITS FOR FOREIGN BODY
3 series · 3 of 3 positions shown · non-contrast
Comparison: None.

CLINICAL DATA: Metal working/exposure; clearance prior to MRI

EXAM:
ORBITS FOR FOREIGN BODY - 2 VIEW

[orbits waters (1 of 3)]
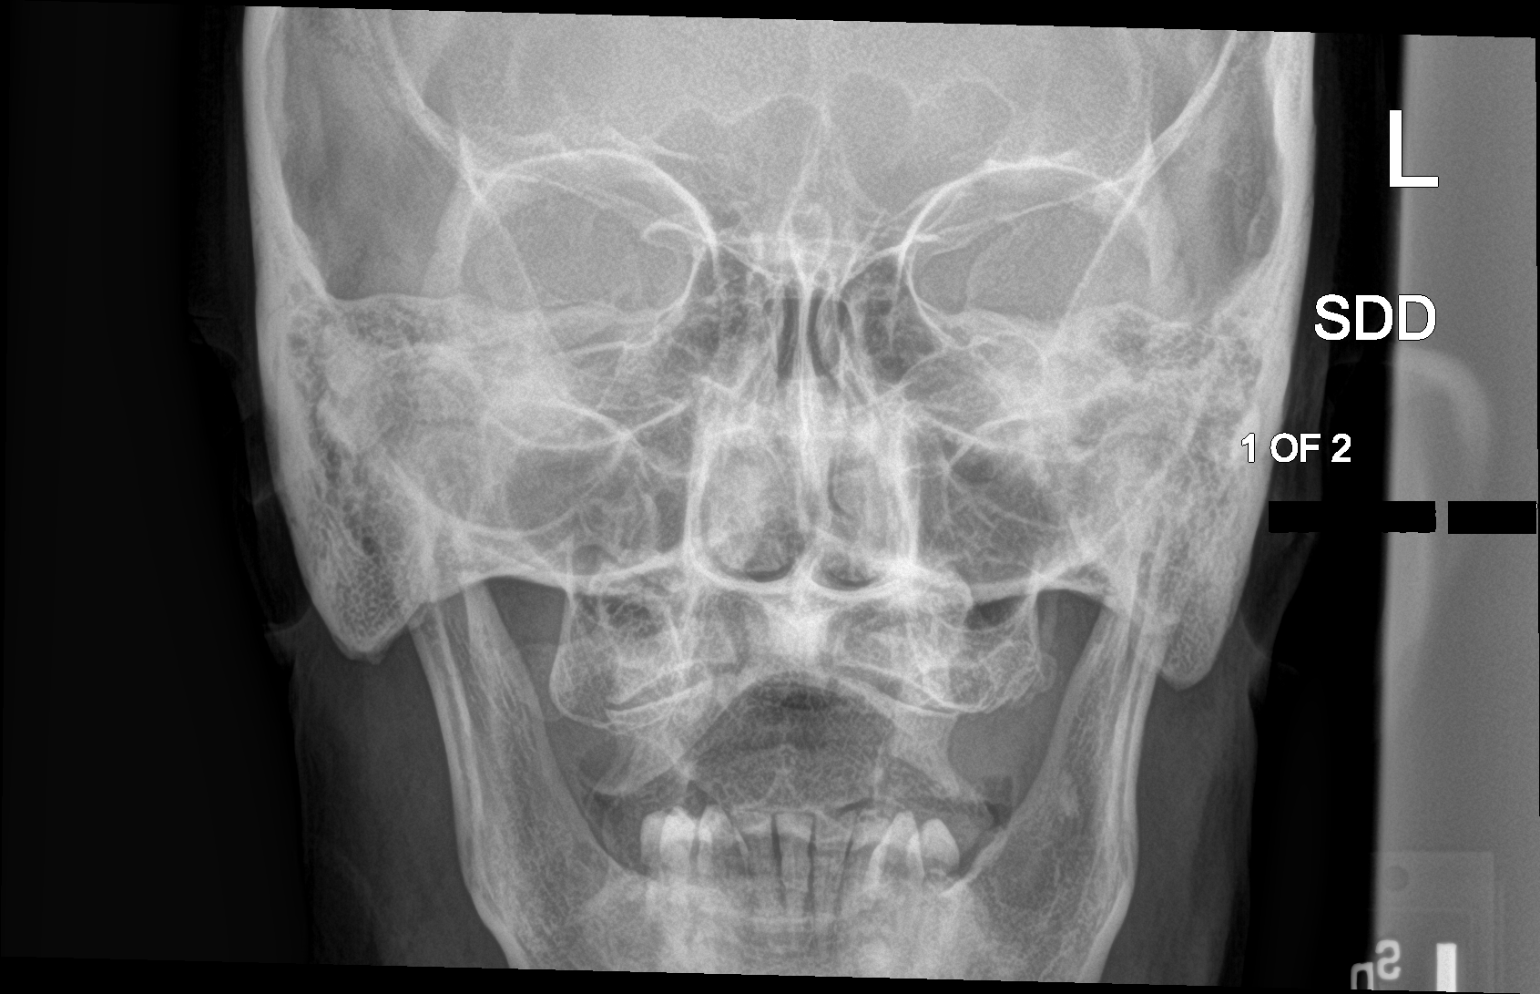

[orbits waters (2 of 3)]
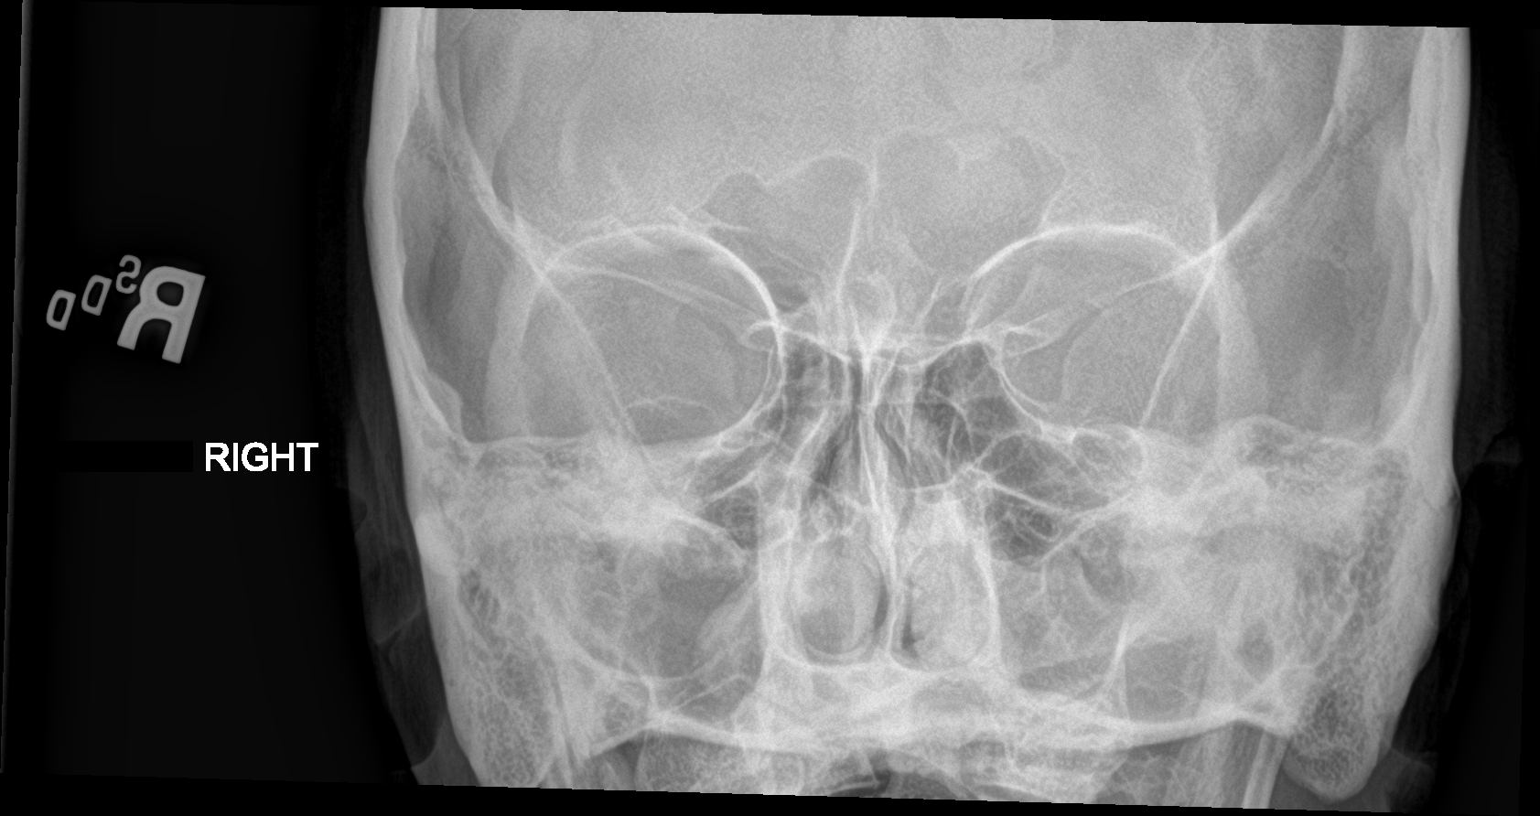

[orbits waters (3 of 3)]
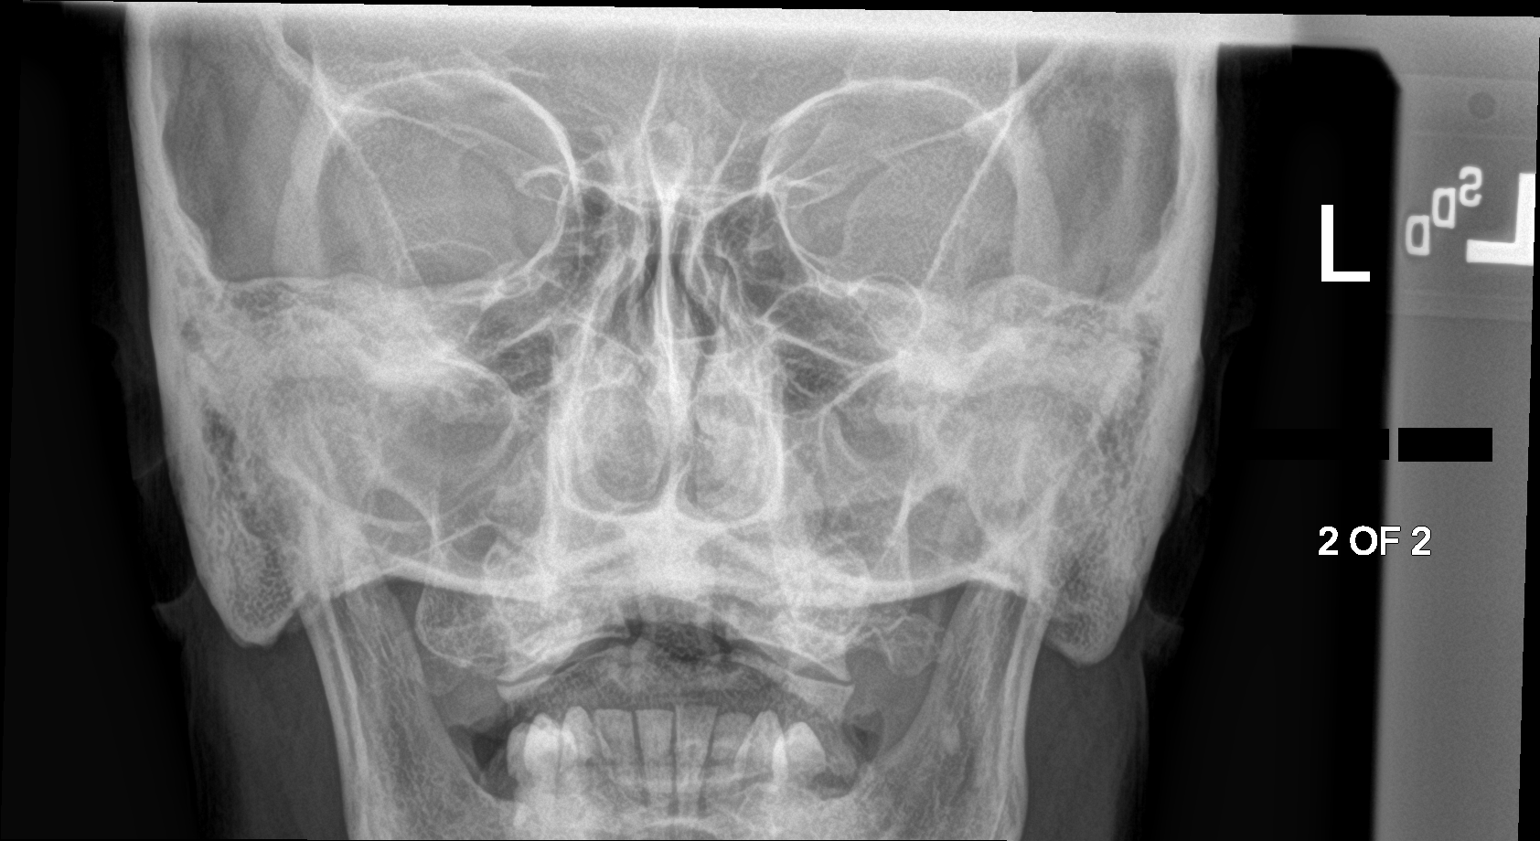

[3 of 3 positions shown; findings below may reference images not displayed]

FINDINGS: There is no evidence of metallic foreign body within the orbits. No
significant bone abnormality identified.
IMPRESSION: No evidence of metallic foreign body within the orbits.

## 2021-03-20 MED ORDER — SENNOSIDES-DOCUSATE SODIUM 8.6-50 MG PO TABS: 1.0000 | ORAL_TABLET | Freq: Every evening | ORAL | Status: DC | PRN

## 2021-03-20 MED ORDER — POTASSIUM CHLORIDE CRYS ER 20 MEQ PO TBCR
40.0000 meq | EXTENDED_RELEASE_TABLET | Freq: Once | ORAL | Status: AC
  Administered 2021-03-21: 02:00:00 40 meq via ORAL
  Filled 2021-03-20: qty 2

## 2021-03-20 MED ORDER — ENOXAPARIN SODIUM 40 MG/0.4ML IJ SOSY: 40.0000 mg | PREFILLED_SYRINGE | INTRAMUSCULAR | Status: DC

## 2021-03-20 MED ORDER — ASPIRIN 81 MG PO CHEW
324.0000 mg | CHEWABLE_TABLET | Freq: Once | ORAL | Status: AC
  Administered 2021-03-20: 324 mg via ORAL

## 2021-03-20 MED ORDER — ACETAMINOPHEN 325 MG PO TABS
650.0000 mg | ORAL_TABLET | ORAL | Status: DC | PRN
  Administered 2021-03-21: 02:00:00 650 mg via ORAL
  Filled 2021-03-20: qty 2

## 2021-03-20 MED ORDER — STROKE: EARLY STAGES OF RECOVERY BOOK: Freq: Once | Status: AC

## 2021-03-20 MED ORDER — ACETAMINOPHEN 650 MG RE SUPP: 650.0000 mg | RECTAL | Status: DC | PRN

## 2021-03-20 MED ORDER — ASPIRIN 300 MG RE SUPP: 300.0000 mg | Freq: Every day | RECTAL | Status: DC

## 2021-03-20 MED ORDER — ACETAMINOPHEN 160 MG/5ML PO SOLN
650.0000 mg | ORAL | Status: DC | PRN
  Filled 2021-03-20: qty 20.3

## 2021-03-20 MED ORDER — ASPIRIN 325 MG PO TABS
325.0000 mg | ORAL_TABLET | Freq: Every day | ORAL | Status: DC
  Administered 2021-03-21: 13:00:00 325 mg via ORAL
  Filled 2021-03-20 (×2): qty 1

## 2021-03-20 NOTE — ED Notes (Signed)
Pt alert, speech clear, pt has tingling in left arm/hand. No headache, no sob, no chest pain.  No n/v/d.  No diff swallowing, eating or drinking fluids.

## 2021-03-20 NOTE — ED Provider Notes (Signed)
[facility]    CSN: [id number] Arrival date & time: 03/20/21  1907      History   Chief Complaint Chief Complaint  Patient presents with  . Numbness    HPI Deion Swift is a 65 y.o. male presenting for acute onset of tingling of the left lip, left side of face, left jaw, entire left arm and left calf over the last 20 minutes.  Patient drove himself to the urgent care today.  He denies any chest pain, palpitations or breathing difficulty.  Patient says that he has felt dizzy at times.  No diaphoresis.  No confusion, weakness.  Patient does state that he feels like he is "walking funny."  He denies any falls or stumbles.  He denies any injuries.  No head injury specifically.  Denies any headaches or vision changes.  No nausea/vomiting.  Patient denies any new medications.  He says he does not have any rashes or swelling anywhere on his body.  Denies similar problem in the past.  Patient has no history of heart attack or stroke.  He does have elevated blood pressure here today at 181/101.  Patient does not take anything for his blood pressure.  He says he had a checkup a few weeks ago and everything looked good. No other symptoms or complaints.  HPI  Past Medical History:  Diagnosis Date  . Anemia 03/20/2021  . Flu DEC 2016  . Hernia, inguinal, right 09/02/2015  . Hyperlipemia   . Kidney stone     Patient Active Problem List   Diagnosis Date Noted  . Acute CVA (cerebrovascular accident) ([place]) 03/20/2021  . HTN (hypertension) 03/20/2021  . HLD (hyperlipidemia) 03/20/2021  . Anemia 03/20/2021  . Hypokalemia 03/20/2021    Past Surgical History:  Procedure Laterality Date  . HERNIA REPAIR    . INGUINAL HERNIA REPAIR Right 12/23/2015   Procedure: HERNIA REPAIR INGUINAL ADULT;  Surgeon: Leonie Green, MD;  Location: [facility] ORS;  Service: General;  Laterality: Right;       Home Medications    Prior to Admission medications   Medication Sig Start Date End Date  Taking? Authorizing Provider  meloxicam (MOBIC) 15 MG tablet Take 1 tablet by mouth daily as needed. 11/30/20   [provider]  sildenafil (VIAGRA) 50 MG tablet Take 50 mg by mouth as needed. 03/15/21   [provider]  traZODone (DESYREL) 50 MG tablet Take 50 mg by mouth at bedtime. 03/03/21   [provider]    Family History Family History  Problem Relation Age of Onset  . Breast cancer Mother   . CAD Brother     Social History Social History   Tobacco Use  . Smoking status: Never Smoker  . Smokeless tobacco: Never Used  Vaping Use  . Vaping Use: Never used  Substance Use Topics  . Alcohol use: Yes    Comment: 1-2 per month  . Drug use: No     Allergies   Patient has no known allergies.   Review of Systems Review of Systems  Constitutional: Negative for fatigue and fever.  HENT: Negative for congestion.   Eyes: Negative for photophobia and visual disturbance.  Respiratory: Negative for cough, shortness of breath and wheezing.   Cardiovascular: Negative for chest pain and palpitations.  Gastrointestinal: Negative for abdominal pain, nausea and vomiting.  Musculoskeletal: Positive for gait problem (" walking a little funny").  Neurological: Positive for dizziness and numbness. Negative for syncope, speech difficulty, weakness and headaches.  Psychiatric/Behavioral: Negative for confusion.     Physical Exam Triage Vital Signs ED Triage Vitals  Enc Vitals Group     BP 03/20/21 1913 (!) 181/101     Pulse Rate 03/20/21 1913 75     Resp 03/20/21 1913 18     Temp 03/20/21 1913 98 F (36.7 C)     Temp Source 03/20/21 1913 Oral     SpO2 03/20/21 1913 100 %     Weight 03/20/21 1909 175 lb 0.7 oz (79.4 kg)     Height 03/20/21 1909 5\' 7"  (1.702 m)     Head Circumference --      Peak Flow --      Pain Score 03/20/21 1909 7     Pain Loc --      Pain Edu? --      Excl. in [place]? --    No data found.  Updated Vital Signs BP (!) 181/101 (BP  Location: Left Arm)   Pulse 75   Temp 98 F (36.7 C) (Oral)   Resp 18   Ht 5\' 7"  (1.702 m)   Wt 175 lb 0.7 oz (79.4 kg)   SpO2 100%   BMI 27.42 kg/m       Physical Exam Vitals and nursing note reviewed.  Constitutional:      General: He is not in acute distress.    Appearance: Normal appearance. He is well-developed and normal weight. He is not ill-appearing.  HENT:     Head: Normocephalic and atraumatic.     Nose: Nose normal.     Mouth/Throat:     Mouth: Mucous membranes are moist.     Pharynx: Oropharynx is clear.  Eyes:     General: No scleral icterus.    Extraocular Movements: Extraocular movements intact.     Conjunctiva/sclera: Conjunctivae normal.     Pupils: Pupils are equal, round, and reactive to light.  Cardiovascular:     Rate and Rhythm: Normal rate and regular rhythm.     Heart sounds: Normal heart sounds.  Pulmonary:     Effort: Pulmonary effort is normal. No respiratory distress.     Breath sounds: Normal breath sounds.  Abdominal:     Palpations: Abdomen is soft.     Tenderness: There is no abdominal tenderness.  Musculoskeletal:     Cervical back: Neck supple.  Skin:    General: Skin is warm and dry.  Neurological:     General: No focal deficit present.     Mental Status: He is alert and oriented to person, place, and time. Mental status is at baseline.     Cranial Nerves: No cranial nerve deficit.     Motor: No weakness.     Gait: Gait normal.     Comments: 5/5 strength bilat UEs and LEs. Negative pronator drift  Psychiatric:        Mood and Affect: Mood normal.        Behavior: Behavior normal.        Thought Content: Thought content normal.      UC Treatments / Results  Labs (all labs ordered are listed, but only abnormal results are displayed) Labs Reviewed - No data to display  EKG   Radiology DG Eye Foreign Body  Result Date: 03/20/2021 CLINICAL DATA:  Metal working/exposure; clearance prior to MRI EXAM: ORBITS FOR FOREIGN  BODY - 2 VIEW COMPARISON:  None. FINDINGS: There is no evidence of metallic foreign body within the orbits. No significant bone abnormality identified.  IMPRESSION: No evidence of metallic foreign body within the orbits. Electronically Signed   By: Anner Crete M.D.   On: 03/20/2021 22:45   MR BRAIN WO CONTRAST  Result Date: 03/21/2021 CLINICAL DATA:  Initial evaluation for acute neuro deficit, stroke suspected EXAM: MRI HEAD WITHOUT CONTRAST TECHNIQUE: Multiplanar, multiecho pulse sequences of the brain and surrounding structures were obtained without intravenous contrast. COMPARISON:  Prior CT from 03/20/2021. FINDINGS: Brain: Cerebral volume within normal limits. No significant cerebral white matter disease for age. 1 cm focus of restricted diffusion seen involving the posterior right thalamocapsular region, consistent with an acute ischemic infarct. No associated hemorrhage or mass effect. No other evidence for acute or subacute ischemia. Gray-white matter differentiation otherwise maintained. No other encephalomalacia to suggest chronic cortical infarction. No other evidence for acute or chronic intracranial hemorrhage. No mass lesion, midline shift or mass effect. No hydrocephalus or extra-axial fluid collection. Pituitary gland suprasellar region within normal limits. Midline structures intact. Vascular: Major intracranial vascular flow voids are maintained. Skull and upper cervical spine: Craniocervical junction within normal limits. Bone marrow signal intensity normal. No scalp soft tissue abnormality. Sinuses/Orbits: Globes and orbital soft tissues within normal limits. Mild scattered mucosal thickening noted within the ethmoidal air cells and maxillary sinuses. Paranasal sinuses are otherwise clear. No mastoid effusion. Inner ear structures grossly normal. Other: None. IMPRESSION: 1. 1 cm acute ischemic nonhemorrhagic infarct involving the right thalamocapsular region. 2. Otherwise normal brain MRI  for age. Electronically Signed   By: Jeannine Boga M.D.   On: 03/21/2021 02:47   US Carotid Bilateral (at [facility] and [facility] only)  Result Date: 03/21/2021 CLINICAL DATA:  Acute focal neurological deficit. History of hypertension and hyperlipidemia. EXAM: BILATERAL CAROTID DUPLEX ULTRASOUND TECHNIQUE: Pearline Cables scale imaging, color Doppler and duplex ultrasound were performed of bilateral carotid and vertebral arteries in the neck. COMPARISON:  None. FINDINGS: Criteria: Quantification of carotid stenosis is based on velocity parameters that correlate the residual internal carotid diameter with NASCET-based stenosis levels, using the diameter of the distal internal carotid lumen as the denominator for stenosis measurement. The following velocity measurements were obtained: RIGHT ICA: 89/17 cm/sec CCA: 93/23 cm/sec SYSTOLIC ICA/CCA RATIO:  1.4 ECA: 108 cm/sec LEFT ICA: 95/35 cm/sec CCA: 557/32 cm/sec SYSTOLIC ICA/CCA RATIO:  1.6 ECA: 106 cm/sec RIGHT CAROTID ARTERY: There is a very minimal amount of intimal thickening/atherosclerotic plaque within the right carotid bulb (image 15), extending to involve the origin and proximal aspects the right internal carotid artery (image 23), not resulting in elevated peak systolic velocities within the interrogated course of the left internal carotid artery to suggest a hemodynamically significant stenosis. RIGHT VERTEBRAL ARTERY:  Antegrade flow. LEFT CAROTID ARTERY: There is a minimal amount of intimal thickening/atherosclerotic plaque within the left carotid bulb (image 47), extending to involve the origin and proximal aspects of the left internal carotid artery (image 54), not resulting in elevated peak systolic velocities within the interrogated course of the left internal carotid artery to suggest a hemodynamically significant stenosis. LEFT VERTEBRAL ARTERY:  Antegrade flow IMPRESSION: Minimal amount of bilateral intimal thickening/atherosclerotic plaque, right subjectively  greater than left, not resulting in a hemodynamically significant stenosis within either internal carotid artery. Electronically Signed   By: Sandi Mariscal M.D.   On: 03/21/2021 07:39   CT HEAD CODE STROKE WO CONTRAST  Result Date: 03/20/2021 CLINICAL DATA:  Code stroke. Lid tingling, left arm and leg tingling EXAM: CT HEAD WITHOUT CONTRAST TECHNIQUE: Contiguous axial images were obtained from the base of  the skull through the vertex without intravenous contrast. COMPARISON:  2016 FINDINGS: Brain: There is no acute intracranial hemorrhage, mass effect, or edema. Gray-white differentiation is preserved. Ventricles and sulci are normal in size and configuration. Vascular: No hyperdense vessel. Skull: Unremarkable. Sinuses/Orbits: Aerated. Other: Mastoid air cells are clear. ASPECTS ([place] Stroke Program Early CT Score) - Ganglionic level infarction (caudate, lentiform nuclei, internal capsule, insula, M1-M3 cortex): 7 - Supraganglionic infarction (M4-M6 cortex): 3 Total score (0-10 with 10 being normal): 10 IMPRESSION: There is no acute intracranial hemorrhage or evidence of acute infarction. ASPECT score is 10. These results were called by telephone at the time of interpretation on 03/20/2021 at 8:37 pm to provider [facility] , who verbally acknowledged these results. Electronically Signed   By: Macy Mis M.D.   On: 03/20/2021 20:47    Procedures ED EKG  Date/Time: 03/20/2021 7:25 PM Performed by: Danton Clap, PA-C Authorized by: Danton Clap, PA-C   Previous ECG:    Previous ECG:  Unavailable Interpretation:    Interpretation: abnormal   Rate:    ECG rate assessment: normal   Rhythm:    Rhythm: sinus rhythm   Ectopy:    Ectopy: none   QRS:    QRS axis:  Normal   QRS intervals:  Normal   QRS conduction: normal   ST segments:    ST segments:  Normal T waves:    T waves: non-specific     Details:  II, III, V1, V2, V3 Comments:     Normal sinus rhythm. T wave changes II,  III, V1, V2 and V3   (including critical care time)  Medications Ordered in UC Medications  aspirin chewable tablet 324 mg (324 mg Oral Given 03/20/21 1926)    Initial Impression / Assessment and Plan / UC Course  I have reviewed the triage vital signs and the nursing notes.  Pertinent labs & imaging results that were available during my care of the patient were reviewed by me and considered in my medical decision making (see chart for details).   65 year old male presenting for acute onset of left facial numbness and numbness of the left arm and left calf.  Also admits a little bit of dizziness and feels like his gait is "off."  Blood pressure is 181/101 in clinic.  He has no history of hypertension and I did review previous progress notes.  Exam is benign today.  Normal neurological exam including cranial nerves and negative pronator drift.  Patient denying any chest pain or shortness of breath.  EKG performed today shows normal sinus rhythm with nonspecific T wave changes in leads II, III, V1, V2 and V3.  No previous EKG to compare to.  Patient given 324 mg aspirin for concern about possible ACS given EKG changes and left arm numbness.  Explained to patient that I am concerned for possible ACS or CVA/TIA.  Patient advised that he needs immediate evaluation in the emergency department.  Patient is agreeable.  He did drive himself to the clinic today.  Advised him that he should go via EMS and he agrees to this.  EMS given report and patient leaves in stable condition from [facility] urgent care.   Final Clinical Impressions(s) / UC Diagnoses   Final diagnoses:  Left sided numbness  Paresthesia  Hypertensive urgency  Nonspecific abnormal electrocardiogram (ECG) (EKG)     Discharge Instructions     You have been advised to follow up immediately in the emergency department for concerning  signs.symptoms. If you declined EMS transport, please have a family member take you directly to  the ED at this time. Do not delay. Based on concerns about condition, if you do not follow up in th e ED, you may risk poor outcomes including worsening of condition, delayed treatment and potentially life threatening issues. If you have declined to go to the ED at this time, you should call your PCP immediately to set up a follow up appointment.  Go to ED for red flag symptoms, including; fevers you cannot reduce with Tylenol/Motrin, severe headaches, vision changes, numbness/weakness in part of the body, lethargy, confusion, intractable vomiting, severe dehydration, chest pain, breathing difficulty, severe persistent abdominal or pelvic pain, signs of severe infection (increased redness, swelling of an area), feeling faint or passing out, dizziness, etc. You should especially go to the ED for sudden acute worsening of condition if you do not elect to go at this time.     ED Prescriptions    None     PDMP not reviewed this encounter.   Danton Clap, PA-C 03/21/21 [phone]

## 2021-03-20 NOTE — ED Notes (Signed)
Report messaged to [employer]

## 2021-03-20 NOTE — ED Provider Notes (Signed)
[facility] Emergency Department Provider Note   ____________________________________________   Event Date/Time   First MD Initiated Contact with Patient 03/20/21 2007     (approximate)  I have reviewed the triage vital signs and the nursing notes.   HISTORY  Chief Complaint Numbness    HPI Archer Moist is a 65 y.o. male with past medical history of hyperlipidemia who presents to the ED complaining of numbness and weakness.  Patient reports that he was driving home from work this evening around 530 when he suddenly started noticing numbness and tingling along his left face, left arm, and left leg.  He has started to feel weak in his left leg, which feels heavier than his right.  He denies any weakness in his left arm and he has not noticed any facial droop.  He denies any vision changes or speech changes.  Symptoms initially felt like they could be improving but have since worsened since he arrived to the ED.  He initially sought care at urgent care and was referred to the ED for further evaluation.  Patient does not take any blood thinners or any other medications on a regular basis.        Past Medical History:  Diagnosis Date  . Flu DEC 2016  . Hernia, inguinal, right 09/02/2015  . Hyperlipemia   . Kidney stone     There are no problems to display for this patient.   Past Surgical History:  Procedure Laterality Date  . HERNIA REPAIR    . INGUINAL HERNIA REPAIR Right 12/23/2015   Procedure: HERNIA REPAIR INGUINAL ADULT;  Surgeon: Leonie Green, MD;  Location: [facility] ORS;  Service: General;  Laterality: Right;    Prior to Admission medications   Medication Sig Start Date End Date Taking? Authorizing Provider  DM-Doxylamine-Acetaminophen (NYQUIL COLD & FLU PO) Take by mouth as needed.    [provider]  HYDROcodone-acetaminophen (NORCO) 5-325 MG tablet Take 1-2 tablets by mouth every 4 (four) hours as needed for moderate pain. 12/23/15    Leonie Green, MD    Allergies Patient has no known allergies.  History reviewed. No pertinent family history.  Social History Social History   Tobacco Use  . Smoking status: Never Smoker  . Smokeless tobacco: Never Used  Vaping Use  . Vaping Use: Never used  Substance Use Topics  . Alcohol use: No  . Drug use: No    Review of Systems  Constitutional: No fever/chills Eyes: No visual changes. ENT: No sore throat. Cardiovascular: Denies chest pain. Respiratory: Denies shortness of breath. Gastrointestinal: No abdominal pain.  No nausea, no vomiting.  No diarrhea.  No constipation. Genitourinary: Negative for dysuria. Musculoskeletal: Negative for back pain. Skin: Negative for rash. Neurological: Negative for headaches, positive for numbness and weakness.  ____________________________________________   PHYSICAL EXAM:  VITAL SIGNS: ED Triage Vitals  Enc Vitals Group     BP      Pulse      Resp      Temp      Temp src      SpO2      Weight      Height      Head Circumference      Peak Flow      Pain Score      Pain Loc      Pain Edu?      Excl. in [place]?     Constitutional: Alert and oriented. Eyes: Conjunctivae are normal.  Head: Atraumatic. Nose: No congestion/rhinnorhea. Mouth/Throat: Mucous membranes are moist. Neck: Normal ROM Cardiovascular: Normal rate, regular rhythm. Grossly normal heart sounds. Respiratory: Normal respiratory effort.  No retractions. Lungs CTAB. Gastrointestinal: Soft and nontender. No distention. Genitourinary: deferred Musculoskeletal: No lower extremity tenderness nor edema. Neurologic:  Normal speech and language.  4+ out of 5 strength in left upper extremity and left lower extremity, 5 out of 5 strength in right upper and lower extremities. Skin:  Skin is warm, dry and intact. No rash noted. Psychiatric: Mood and affect are normal. Speech and behavior are normal.  ____________________________________________    LABS (all labs ordered are listed, but only abnormal results are displayed)  Labs Reviewed  CBC - Abnormal; Notable for the following components:      Result Value   RBC 3.83 (*)    Hemoglobin 11.3 (*)    HCT 33.6 (*)    Platelets 146 (*)    All other components within normal limits  COMPREHENSIVE METABOLIC PANEL - Abnormal; Notable for the following components:   Potassium 3.3 (*)    Glucose, Bld 100 (*)    All other components within normal limits  TROPONIN I (HIGH SENSITIVITY) - Abnormal; Notable for the following components:   Troponin I (High Sensitivity) 22 (*)    All other components within normal limits  RESP PANEL BY RT-PCR (FLU A&B, COVID) ARPGX2  ETHANOL  PROTIME-INR  APTT  DIFFERENTIAL  URINE DRUG SCREEN, QUALITATIVE ([facility] ONLY)  URINALYSIS, ROUTINE W REFLEX MICROSCOPIC   ____________________________________________  EKG  ED ECG REPORT I, Blake Divine, the attending physician, personally viewed and interpreted this ECG.   Date: 03/20/2021  EKG Time: 19:11  Rate: 72  Rhythm: normal sinus rhythm  Axis: LAD  Intervals:none  ST&T Change: Anterolateral T wave inversions   PROCEDURES  Procedure(s) performed (including Critical Care):  .Critical Care Performed by: Blake Divine, MD Authorized by: Blake Divine, MD   Critical care provider statement:    Critical care time (minutes):  45   Critical care time was exclusive of:  Separately billable procedures and treating other patients and teaching time   Critical care was necessary to treat or prevent imminent or life-threatening deterioration of the following conditions:  CNS failure or compromise   Critical care was time spent personally by me on the following activities:  Discussions with consultants, evaluation of patient's response to treatment, examination of patient, ordering and performing treatments and interventions, ordering and review of laboratory studies, ordering and review of  radiographic studies, pulse oximetry, re-evaluation of patient's condition, obtaining history from patient or surrogate and review of old charts   I assumed direction of critical care for this patient from another provider in my specialty: no     Care discussed with: admitting provider       ____________________________________________   INITIAL IMPRESSION / [place] / ED COURSE       65 year old male with past medical history of hyperlipidemia who presents to the ED with acute onset left-sided numbness and tingling associated with some weakness in his left leg starting around 5:30 PM this evening.  Patient appears to have subtle weakness in his left upper and lower extremity concerning for stroke, code stroke called.  CT head reviewed by me and shows no obvious hemorrhage, negative for acute process per radiology.  EKG shows nonspecific T wave changes, troponin mildly elevated but I have a low suspicion for cardiac process.  Patient pending further evaluation by tele-neurologist.  Patient evaluated  by telemetry neurologist and symptoms not currently impressive enough to warrant tPA, low suspicion for large vessel occlusion.  Neurology recommends proceeding with MRI and case discussed with hospitalist for admission for further stroke rule out.      ____________________________________________   FINAL CLINICAL IMPRESSION(S) / ED DIAGNOSES  Final diagnoses:  Numbness     ED Discharge Orders    None       Note:  This document was prepared using Dragon voice recognition software and may include unintentional dictation errors.   Blake Divine, MD 03/20/21 2144

## 2021-03-20 NOTE — ED Notes (Signed)
Teleneuro on computer with pt now

## 2021-03-20 NOTE — ED Notes (Signed)
Family with pt now.  

## 2021-03-20 NOTE — ED Notes (Signed)
Patient is being discharged from the Urgent Care and sent to the Emergency Department via EMS . Per Christene Slates, PA, patient is in need of higher level of care due to arm tingling, EKG changes. Patient is aware and verbalizes understanding of plan of care.  Vitals:   03/20/21 1913  BP: (!) 181/101  Pulse: 75  Resp: 18  Temp: 98 F (36.7 C)  SpO2: 100%

## 2021-03-20 NOTE — ED Triage Notes (Signed)
Patient c/o lip tingling, left arm tinglinf and left leg tingling that started 20 minutes ago. Denies headache, denies chest pain, denies nausea.

## 2021-03-20 NOTE — Consult Note (Signed)
[facility] TeleNeurology Consult Services   Date of Service:   03/20/2021 20:57:31  Diagnosis:     .  R20.2 - Paresthesia of skin  Impression:     . 65 yo M with acute onset L sided face arm and leg paresthesias. NIHSS is 0 because he has no loss of sensation. However, given the distribution of ongoing paresthesias, recommend MRI of the brain wwo contrast, admit for stroke workup. I cannot rule out stroke but given the nondisabling symptoms, he is not a candidate for TPA. LVO is unlikely.  Metrics: Last Known Well: 03/20/2021 17:30:00 [facility] Notification Time: 03/20/2021 20:57:31 Arrival Time: 03/20/2021 20:06:00 Stamp Time: 03/20/2021 20:57:31 Initial Response Time: 03/20/2021 21:02:31 Symptoms: paresthesias. NIHSS Start Assessment Time: 03/20/2021 21:11:36 Patient is not a candidate for Thrombolytic. Thrombolytic Medical Decision: 03/20/2021 21:15:04 Patient was not deemed candidate for Thrombolytic because of following reasons: No disabling symptoms.  CT head showed no acute hemorrhage or acute core infarct.  ED Physician notified of diagnostic impression and management plan on 03/20/2021 21:30:13  Advanced Imaging: Advanced Imaging Not Recommended because:  Clinical presentation is not suggestion of LVO or Low clinical suspicion of LVO based on presentation   Our recommendations are outlined below.  Recommendations:      .  Stroke/Telemetry Floor     .  Neuro Checks     .  Bedside Swallow Eval     .  DVT Prophylaxis     .  IV Fluids, Normal Saline     .  Head of Bed 30 Degrees     .  Euglycemia and Avoid Hyperthermia (PRN Acetaminophen)     .  Initiate Aspirin 325 MG Daily     .  MRI brain wwo contrast  Routine Consultation with [facility] for Follow up Care  Sign Out:     .  Discussed with Emergency Department Provider    ------------------------------------------------------------------------------  History of  Present Illness: Patient is a 65 year old Male.  Patient was brought by private transportation with symptoms of paresthesias.  He was working this afternoon, then driving home, he had L side facial tingling including his lip and then down to his arm. It is still present. He works on cars. He denies nausea, headache, light sensitivity. He denies any dysarthria or dysphagia.   Past Medical History:     . Hyperlipidemia     . kidney stones         Anticoagulant use:  No  Antiplatelet use: No  Allergies:  NKDA    Examination: BP(160/100), Pulse(70), Blood Glucose(100) 1A: Level of Consciousness - Alert; keenly responsive + 0 1B: Ask Month and Age - Both Questions Right + 0 1C: Blink Eyes & Squeeze Hands - Performs Both Tasks + 0 2: Test Horizontal Extraocular Movements - Normal + 0 3: Test Visual Fields - No Visual Loss + 0 4: Test Facial Palsy (Use Grimace if Obtunded) - Normal symmetry + 0 5A: Test Left Arm Motor Drift - No Drift for 10 Seconds + 0 5B: Test Right Arm Motor Drift - No Drift for 10 Seconds + 0 6A: Test Left Leg Motor Drift - No Drift for 5 Seconds + 0 6B: Test Right Leg Motor Drift - No Drift for 5 Seconds + 0 7: Test Limb Ataxia (FNF/Heel-Shin) - No Ataxia + 0 8: Test Sensation - Normal; No sensory loss + 0 9: Test Language/Aphasia - Normal; No aphasia + 0 10: Test Dysarthria - Normal + 0 11:  Test Extinction/Inattention - No abnormality + 0  NIHSS Score: 0  NIHSS Free Text : still feels tingling in L face and arm but NO loss of sensation.  Pre-Morbid Modified Rankin Scale: 0 Points = No symptoms at all   Patient/Family was informed the Neurology Consult would occur via TeleHealth consult by way of interactive audio and video telecommunications and consented to receiving care in this manner.   Patient is being evaluated for possible acute neurologic impairment and high probability of imminent or life-threatening deterioration. I spent total of 25 minutes  providing care to this patient, including time for face to face visit via telemedicine, review of medical records, imaging studies and discussion of findings with providers, the patient and/or family.   Dr Alinda Dooms   [facility] [phone]  Case [id number]

## 2021-03-20 NOTE — H&P (Addendum)
History and Physical   Jimmy Montoya GMW:[id number] DOB: Mar 13, 1956 DOA: 03/20/2021  PCP: Adin Hector, MD   Patient coming from: Home  Chief Complaint: Numbness  HPI: Jimmy Montoya is a 65 y.o. male with medical history significant of anemia, hyperlipidemia, inguinal hernia, leukopenia, vitiligo, testosterone deficiency who presents with sudden onset of left-sided numbness and tingling.  Patient states that he was driving home from work around 5:30 PM when he noticed sudden onset of left arm, leg, face numbness and tingling.  He went to be evaluated at urgent care who referred him to the ED for further evaluation.  In the ED he initially felt like his symptoms were improving however then he had some onset of heaviness/weakness of the left side as well.  Denies fever, chills, chest pain shortness of breath, abdominal pain, constipation, diarrhea, nausea, vomiting.  Denies dark or bloody stools.  ED Course: Vital signs in the ED significant for blood pressure in 440H to 474Q systolic.  Vital signs otherwise stable.  Lab work-up showed CMP with potassium 3.3.  CBC with hemoglobin 11.3 down from previous of 13.8.  Platelets of 146.  PT, PTT, INR within normal notes.  Troponin initially elevated at 22 with repeat pending.  Respiratory panel flu COVID-negative.  Ethanol level of negative.  UDS and UA pending.  CT head showed no acute normality. Tele-neuro consulted in the ED recommend stroke work-up with formal in-house consult in the morning.  Review of Systems: As per HPI otherwise all other systems reviewed and are negative.  Past Medical History:  Diagnosis Date  . Anemia 03/20/2021  . Flu DEC 2016  . Hernia, inguinal, right 09/02/2015  . Hyperlipemia   . Kidney stone     Past Surgical History:  Procedure Laterality Date  . HERNIA REPAIR    . INGUINAL HERNIA REPAIR Right 12/23/2015   Procedure: HERNIA REPAIR INGUINAL ADULT;  Surgeon: Leonie Green, MD;  Location: [facility] ORS;  Service:  General;  Laterality: Right;    Social History  reports that he has never smoked. He has never used smokeless tobacco. He reports current alcohol use. He reports that he does not use drugs.  No Known Allergies  Family History  Problem Relation Age of Onset  . Breast cancer Mother   . CAD Brother   Reviewed on admission  Prior to Admission medications   Not on File  Meloxicam Sildenafil Trazodone  Physical Exam: Vitals:   03/20/21 2050 03/20/21 2055 03/20/21 2100 03/20/21 2115  BP:   (!) 161/101 (!) 164/100  Pulse: 81 74 67 72  Resp: 19 (!) 28 (!) 26 11  Temp:      TempSrc:      SpO2: 96% 100% 100% 100%  Weight:      Height:       Physical Exam Constitutional:      General: He is not in acute distress.    Appearance: Normal appearance.  HENT:     Head: Normocephalic and atraumatic.     Mouth/Throat:     Mouth: Mucous membranes are moist.     Pharynx: Oropharynx is clear.  Eyes:     Extraocular Movements: Extraocular movements intact.     Pupils: Pupils are equal, round, and reactive to light.  Cardiovascular:     Rate and Rhythm: Normal rate and regular rhythm.     Pulses: Normal pulses.     Heart sounds: Normal heart sounds.  Pulmonary:     Effort: Pulmonary effort is  normal. No respiratory distress.     Breath sounds: Normal breath sounds.  Abdominal:     General: Bowel sounds are normal. There is no distension.     Palpations: Abdomen is soft.     Tenderness: There is no abdominal tenderness.  Musculoskeletal:        General: No swelling or deformity.  Skin:    General: Skin is warm and dry.  Neurological:     Comments: Mental Status: Patient is awake, alert, oriented x3 No signs of aphasia or neglect Cranial Nerves: II: Pupils equal, round, and reactive to light.  III,IV, VI: EOMI without ptosis or diploplia.  V: Facial sensation is symmetric tolight touch andTemperature. VII: Facial movement is symmetric.  VIII: hearing is intact to  voice X: Uvula elevates symmetrically XI: Shoulder shrug is symmetric. XII: tongue is midline without atrophy or fasciculations.  Motor: Good effort thorughout, at Least 5/5 bilateral UE, 5/5 bilateral lower extremitiy Sensory: Sensation is grossly intact bilateral UEs & LEs, possibly mildly diminished on the left. Cerebellar: Finger-Nose intact bilalat, slower to complete on the left.     Labs on Admission: I have personally reviewed following labs and imaging studies  CBC: Recent Labs  Lab 03/20/21 2015  WBC 4.2  NEUTROABS 2.4  HGB 11.3*  HCT 33.6*  MCV 87.7  PLT 146*    Basic Metabolic Panel: Recent Labs  Lab 03/20/21 2015 03/20/21 2016  NA 139  --   K 3.3*  --   CL 103  --   CO2 28  --   GLUCOSE 100*  --   BUN 12  --   CREATININE 1.02  --   CALCIUM 8.9  --   MG  --  1.4*    GFR: Estimated Creatinine Clearance: 73.9 mL/min (by C-G formula based on SCr of 1.02 mg/dL).  Liver Function Tests: Recent Labs  Lab 03/20/21 2015  AST 24  ALT 18  ALKPHOS 89  BILITOT 0.7  PROT 7.4  ALBUMIN 4.2    Urine analysis: No results found for: COLORURINE, APPEARANCEUR, LABSPEC, PHURINE, GLUCOSEU, HGBUR, BILIRUBINUR, KETONESUR, PROTEINUR, UROBILINOGEN, NITRITE, LEUKOCYTESUR  Radiological Exams on Admission: CT HEAD CODE STROKE WO CONTRAST  Result Date: 03/20/2021 CLINICAL DATA:  Code stroke. Lid tingling, left arm and leg tingling EXAM: CT HEAD WITHOUT CONTRAST TECHNIQUE: Contiguous axial images were obtained from the base of the skull through the vertex without intravenous contrast. COMPARISON:  2016 FINDINGS: Brain: There is no acute intracranial hemorrhage, mass effect, or edema. Gray-white differentiation is preserved. Ventricles and sulci are normal in size and configuration. Vascular: No hyperdense vessel. Skull: Unremarkable. Sinuses/Orbits: Aerated. Other: Mastoid air cells are clear. ASPECTS ([place] Stroke Program Early CT Score) - Ganglionic level infarction  (caudate, lentiform nuclei, internal capsule, insula, M1-M3 cortex): 7 - Supraganglionic infarction (M4-M6 cortex): 3 Total score (0-10 with 10 being normal): 10 IMPRESSION: There is no acute intracranial hemorrhage or evidence of acute infarction. ASPECT score is 10. These results were called by telephone at the time of interpretation on 03/20/2021 at 8:37 pm to provider [facility] , who verbally acknowledged these results. Electronically Signed   By: Macy Mis M.D.   On: 03/20/2021 20:47   EKG: Independently reviewed.  Sinus rhythm at 72 bpm.  Nonspecific T wave changes in leads II, V3, V4, V6.  Assessment/Plan Principal Problem:   Acute CVA (cerebrovascular accident) ([place]) Active Problems:   HTN (hypertension)   HLD (hyperlipidemia)   Anemia   Hypokalemia  Acute focal  neurologic deficit > Patient presenting after sudden onset left arm, leg, face numbness and tingling. > Some heaviness/weakness developing in the ED. > CT head without acute abnormality. > Tele-neurology seen in the ED recommended stroke work-up and will need formal neurology consult in the morning. 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 - Plan to start statin pending MRI results - Echocardiogram  - Carotid doppler  - A1C  - Lipid panel  - Tele monitoring  - SLP eval - PT/OT  Hypertension > BP elevated in the [id number] to [id number] systolic in the ED. > Not currently on any antihypertensives -We will continue to monitor for now in the setting of permissive hypertension as above.  Demand ischemia > Troponin elevated to 22 in the ED with some nonspecific T wave changes on EKG. > This is suspected be due to demand ischemia in the setting of above.  But we will continue to  monitor troponin trend or for any signs of chest pain. - Trend troponin  Anemia > History of this, but hemoglobin 11.3 down from 13.8 on 5/9 per chart review. > No hemorrhage on CT head and no other evidence of bleeding. - We will continue to trend CBC  Hypokalemia > Potassium 3.3 in the ED will plan to check magnesium and replete. - 40 mEq p.o. potassium once passes swallow screen - Check mag  DVT prophylaxis: Lovenox  Code Status:   Full  Family Communication:  Attempted to contact significant other by phone but it went straight to voicemail. Disposition Plan:   Patient is from:  Home  Anticipated DC to:  Home  Anticipated DC date:  1 to 2 days  Anticipated DC barriers: None  Consults called:  Tele-neuro consulted in ED, will need formal in-house neuro consult in the morning.   Admission status:  Observation, telemetry  Severity of Illness: The appropriate patient status for this patient is OBSERVATION. Observation status is judged to be reasonable and necessary in order to provide the required intensity of service to ensure the patient's safety. The patient's presenting symptoms, physical exam findings, and initial radiographic and laboratory data in the context of their medical condition is felt to place them at decreased risk for further clinical deterioration. Furthermore, it is anticipated that the patient will be medically stable for discharge from the hospital within 2 midnights of admission. The following factors support the patient status of observation.   " The patient's presenting symptoms include left-sided facial leg and arm numbness and tingling. " The physical exam findings include slower to complete finger-nose on left.  Some mild numbness and tingling on the left side but sensation is grossly intact. " The initial radiographic and laboratory data are potassium 3.3, hemoglobin 11.3, platelets 146, troponin 22.   Marcelyn Bruins MD [facility]  How to  contact the [facility] Attending or Consulting provider [place] or covering provider during after hours [place], for this patient?   1. Check the care team in [facility] and look for a) attending/consulting [facility] provider listed and b) the [facility] team listed 2. Log into [email or web address] and use [place]'s universal password to access. If you do not have the password, please contact the hospital  operator. 3. Locate the [facility] provider you are looking for under [facility] and page to a number that you can be directly reached. 4. If you still have difficulty reaching the provider, please page the [facility] (Director on Call) for the Hospitalists listed on amion for assistance.  03/20/2021, 10:20 PM

## 2021-03-20 NOTE — ED Triage Notes (Signed)
Pt brought in via ems from [facility] urgent care.  Pt reports tingling in left arm and left side of face.  Pt alert  Speech clear.  md at bedside.

## 2021-03-20 NOTE — ED Notes (Signed)
Pt alert, speech clear.  Pt to floor with er tech.

## 2021-03-20 NOTE — Plan of Care (Signed)
NIHSS #1 due to 1-2 words slurred.  Problem: Education: Goal: Knowledge of secondary prevention will improve Outcome: Progressing Goal: Knowledge of patient specific risk factors addressed and post discharge goals established will improve Outcome: Progressing   Problem: Coping: Goal: Will verbalize positive feelings about self Outcome: Progressing   Problem: Health Behavior/Discharge Planning: Goal: Ability to manage health-related needs will improve Outcome: Progressing   Problem: Nutrition: Goal: Risk of aspiration will decrease Outcome: Progressing   Problem: Ischemic Stroke/TIA Tissue Perfusion: Goal: Complications of ischemic stroke/TIA will be minimized Outcome: Progressing

## 2021-03-20 NOTE — Discharge Instructions (Signed)

## 2021-03-21 ENCOUNTER — Observation Stay: Payer: BC Managed Care – PPO

## 2021-03-21 ENCOUNTER — Observation Stay
Admit: 2021-03-21 | Discharge: 2021-03-21 | Disposition: A | Discharge status: Home / Self Care | Payer: BC Managed Care – PPO | Attending: Internal Medicine | Admitting: Internal Medicine

## 2021-03-21 DIAGNOSIS — I10 Essential (primary) hypertension: Secondary | ICD-10-CM | POA: Diagnosis not present

## 2021-03-21 DIAGNOSIS — I639 Cerebral infarction, unspecified: Secondary | ICD-10-CM

## 2021-03-21 HISTORY — DX: Cerebral infarction, unspecified: I63.9

## 2021-03-21 LAB — LIPID PANEL
Cholesterol: 176 mg/dL (ref 0–200)
HDL: 28 mg/dL — ABNORMAL LOW (ref >40)
LDL Cholesterol: 99 mg/dL (ref 0–99)
Total CHOL/HDL Ratio: 6.3 RATIO
Triglycerides: 244 mg/dL — ABNORMAL HIGH (ref <150)
VLDL: 49 mg/dL — ABNORMAL HIGH (ref 0–40)

## 2021-03-21 LAB — URINALYSIS, ROUTINE W REFLEX MICROSCOPIC
Bacteria, UA: NONE SEEN
Bilirubin Urine: NEGATIVE
Glucose, UA: NEGATIVE mg/dL
Ketones, ur: NEGATIVE mg/dL
Leukocytes,Ua: NEGATIVE
Nitrite: NEGATIVE
Protein, ur: NEGATIVE mg/dL
Specific Gravity, Urine: 1.011 (ref 1.005–1.030)
pH: 5 (ref 5.0–8.0)

## 2021-03-21 LAB — HIV ANTIBODY (ROUTINE TESTING W REFLEX): HIV Screen 4th Generation wRfx: NONREACTIVE

## 2021-03-21 LAB — HEMOGLOBIN A1C
Hgb A1c MFr Bld: 5.5 % (ref 4.8–5.6)
Mean Plasma Glucose: 111 mg/dL

## 2021-03-21 LAB — ECHOCARDIOGRAM COMPLETE
AR max vel: 2.41 cm2
AV Area VTI: 2.34 cm2
AV Area mean vel: 2.23 cm2
AV Mean grad: 2 mmHg
AV Peak grad: 3.7 mmHg
Ao pk vel: 0.96 m/s
Area-P 1/2: 2.66 cm2
Height: 67 in
S' Lateral: 2.76 cm
Weight: 2800 oz

## 2021-03-21 LAB — URINE DRUG SCREEN, QUALITATIVE (ARMC ONLY)
Amphetamines, Ur Screen: NOT DETECTED
Barbiturates, Ur Screen: NOT DETECTED
Benzodiazepine, Ur Scrn: NOT DETECTED
Cannabinoid 50 Ng, Ur ~~LOC~~: POSITIVE — AB
Cocaine Metabolite,Ur ~~LOC~~: NOT DETECTED
MDMA (Ecstasy)Ur Screen: NOT DETECTED
Methadone Scn, Ur: NOT DETECTED
Opiate, Ur Screen: NOT DETECTED
Phencyclidine (PCP) Ur S: NOT DETECTED
Tricyclic, Ur Screen: POSITIVE — AB

## 2021-03-21 IMAGING — MR MR MRA HEAD W/O CM
1 series · 18 of 48 positions shown · IV contrast (gadavist)
Comparison: MRI head earlier same day

CLINICAL DATA: 1 cm acute stroke right thalamus capsular region.
Question vascular dissection.

EXAM:
MRA NECK WITHOUT AND WITH CONTRAST
MRI HEAD WITHOUT CONTRAST
TECHNIQUE: Multiplanar and multiecho pulse sequences of the neck were obtained
without and with intravenous contrast. Angiographic images of the
neck were obtained using MRA technique without and with intravenous
contrast; Angiographic images of the Circle of Willis were obtained
using MRA technique without intravenous contrast.
CONTRAST:  7mL GADAVIST GADOBUTROL 1 MMOL/ML IV SOLN

[Series 5: TOF · axial · 0.5mm · 0.41mm/px · z∈[-61,+36]mm · 18 of 205 slices shown]
[im 1/205]
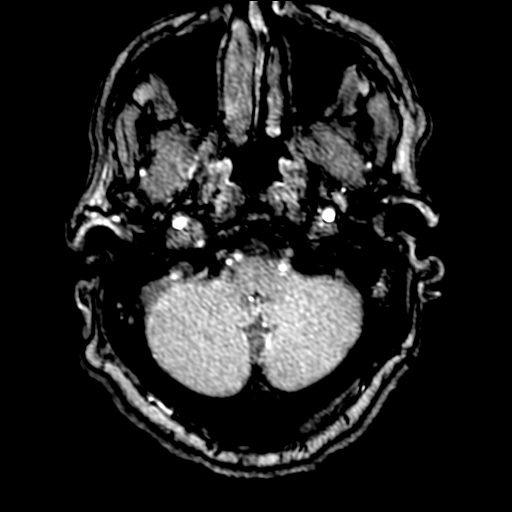
[im 5/205]
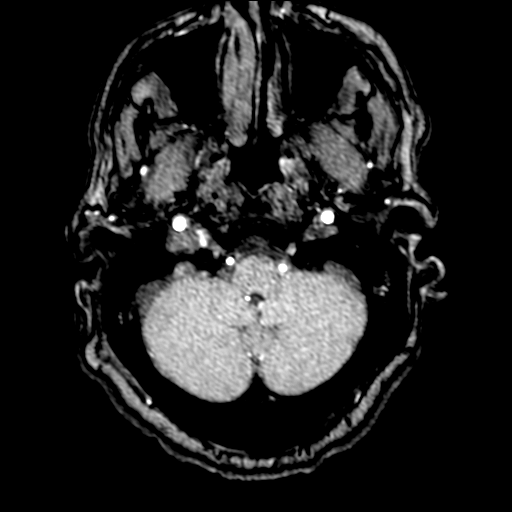
[im 9/205]
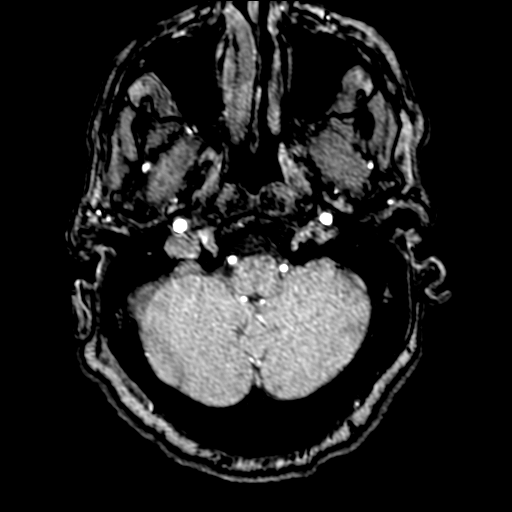
[im 14/205]
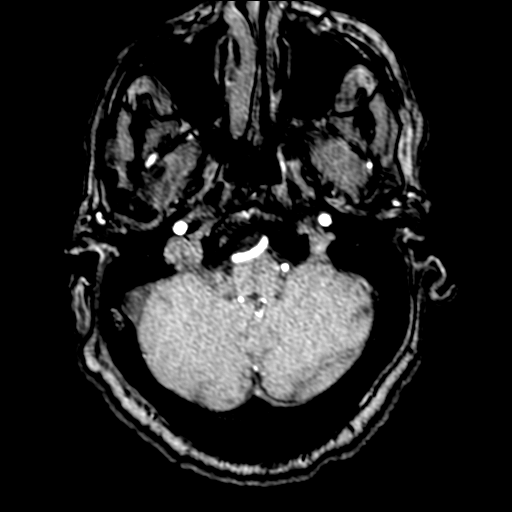
[im 18/205]
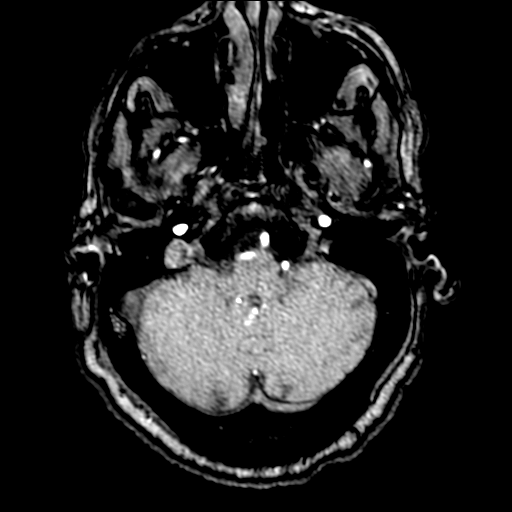
[im 22/205]
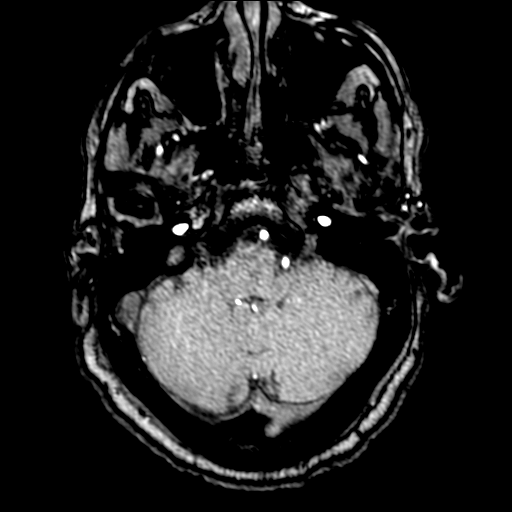
[im 27/205]
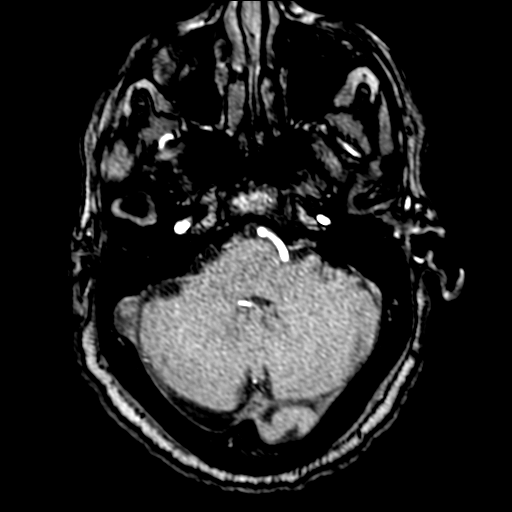
[im 31/205]
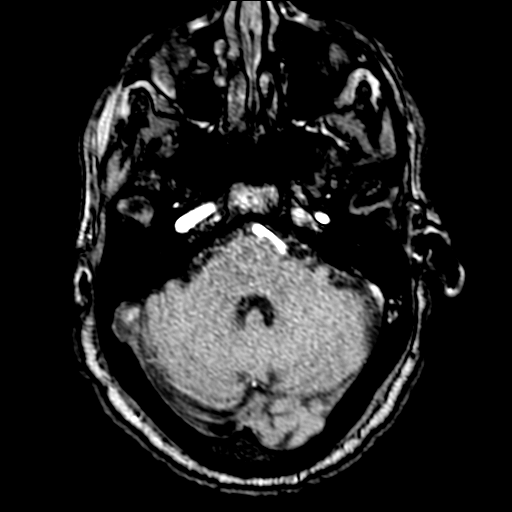
[im 35/205]
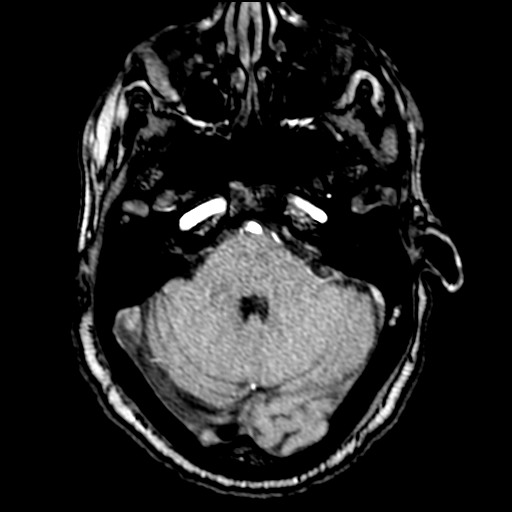
[im 40/205]
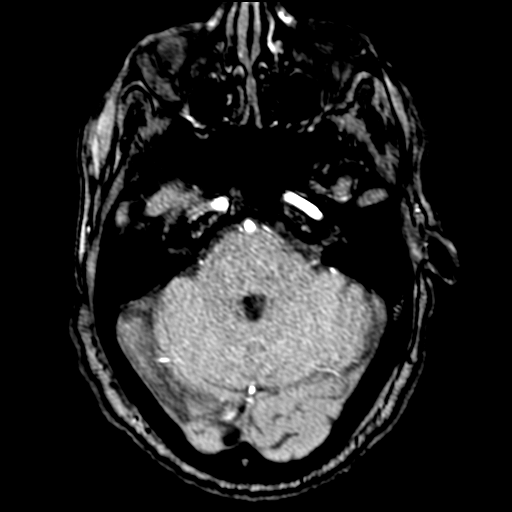
[im 66/205]
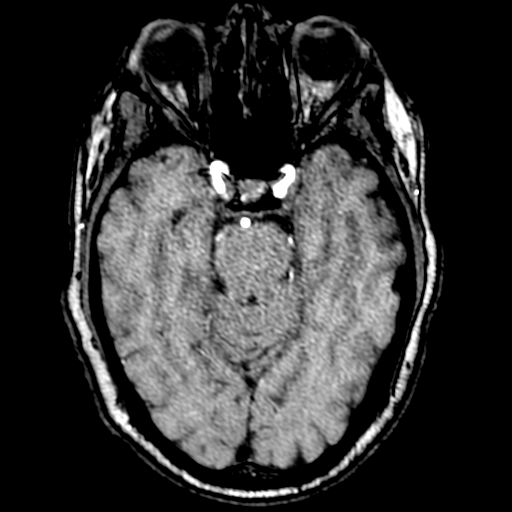
[im 92/205]
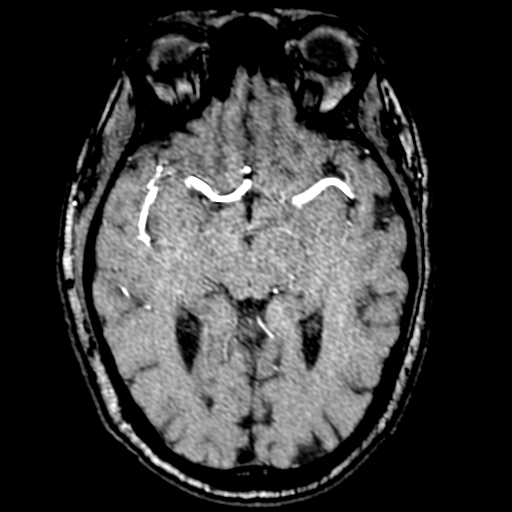
[im 105/205]
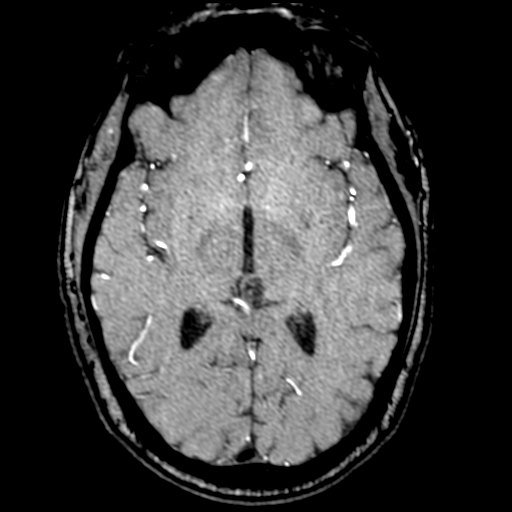
[im 118/205]
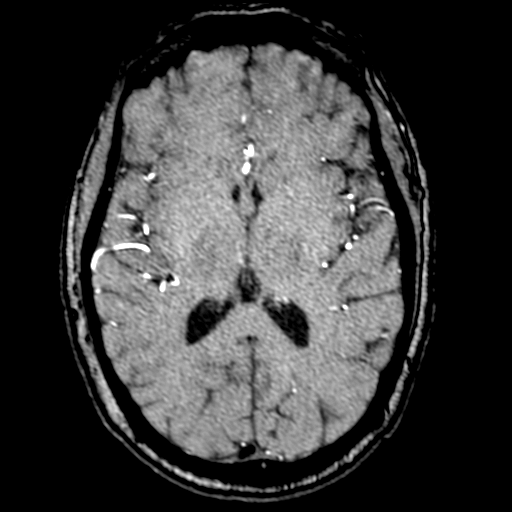
[im 144/205]
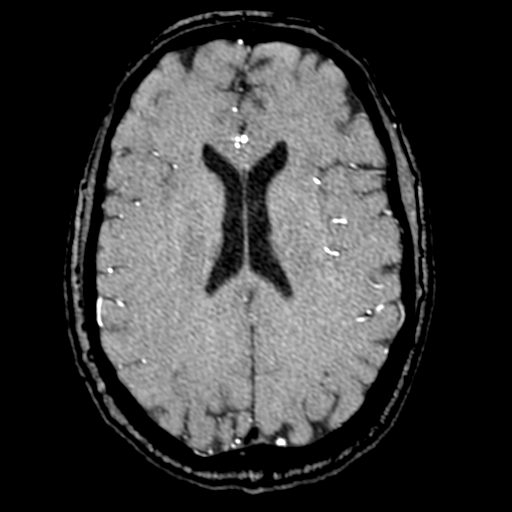
[im 170/205]
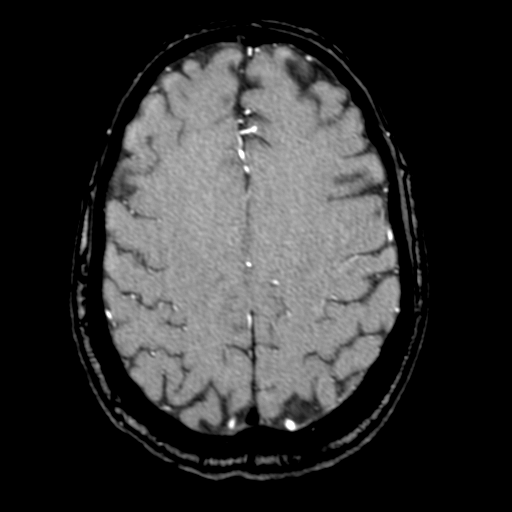
[im 174/205]
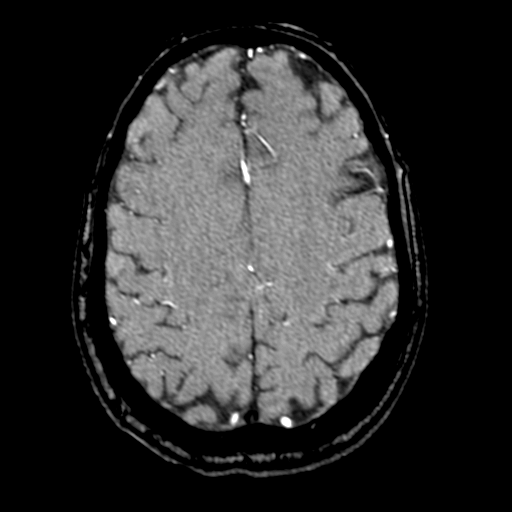
[im 196/205]
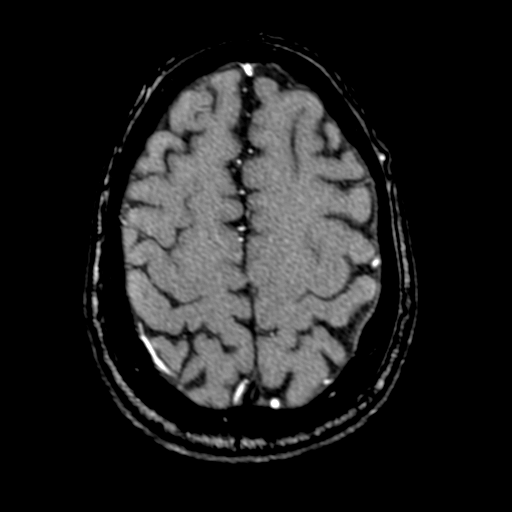

[18 of 48 positions shown; findings below may reference images not displayed]

FINDINGS: MRA NECK FINDINGS

Branching pattern from the arch is normal. Both common carotid
arteries appear widely patent to the bifurcation and normal. Left
bifurcation is low. No stenosis or irregularity at either carotid
bifurcation. Both cervical internal carotid arteries appear normal.
No sign of dissection.

Both vertebral artery origins are widely patent. Both vertebral
arteries appear normal through the cervical region to the foramen
magnum and basilar artery.

MRA HEAD FINDINGS

Both internal carotid arteries widely patent through the skull base
and siphon regions. The right internal carotid artery supplies both
anterior cerebral artery territories, the left A1 segment being
aplastic. Anterior and middle cerebral vessels show flow. Appears to
be mild irregularity of the distal right M1 segment and MCA
bifurcation region, without stenosis greater than 30-50% suspected.
More distal branch vessels are patent. There are a few scattered
stenoses within MCA branches on both sides.

Both vertebral arteries widely patent to the basilar. No basilar
stenosis. Posterior circulation branch vessels are patent. There is
considerable atherosclerotic narrowing and irregularity of the PCA
branches, right much worse than left.
IMPRESSION: No pathologic finding in the neck vessels. Incidental note of a low
carotid bifurcation on the left.

Considerable atherosclerotic narrowing and irregularity within the
posterior cerebral artery branches, worse on the right than the
left.

Atherosclerotic narrowing and irregularity of the distal right M1
segment and right MCA bifurcation region. No stenosis greater than
30-50% suspected. More distal MCA branch vessels on both sides show
some scattered stenoses.

## 2021-03-21 IMAGING — MR MR MRA NECK WO/W CM
1 of 2 series · 20 of 48 positions shown · IV contrast (7ml Gadavist)
Comparison: MRI head earlier same day

CLINICAL DATA: 1 cm acute stroke right thalamus capsular region.
Question vascular dissection.

EXAM:
MRA NECK WITHOUT AND WITH CONTRAST
MRI HEAD WITHOUT CONTRAST
TECHNIQUE: Multiplanar and multiecho pulse sequences of the neck were obtained
without and with intravenous contrast. Angiographic images of the
neck were obtained using MRA technique without and with intravenous
contrast; Angiographic images of the Circle of Willis were obtained
using MRA technique without intravenous contrast.
CONTRAST:  7mL GADAVIST GADOBUTROL 1 MMOL/ML IV SOLN

[Series 14: angio_fl3d_cor_post_ttc=2.0s_moco-adv_sub · coronal · 0.9mm · 0.85mm/px · 20 of 96 slices shown]
[im 1/96]
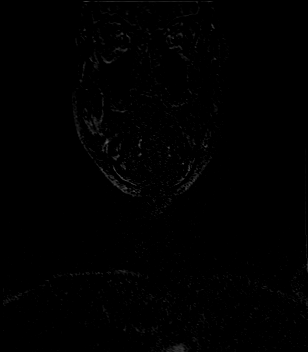
[im 6/96]
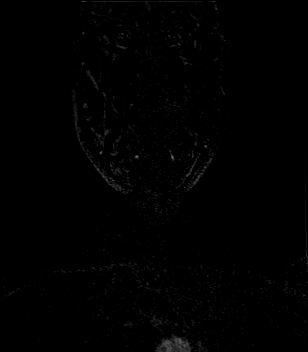
[im 11/96]
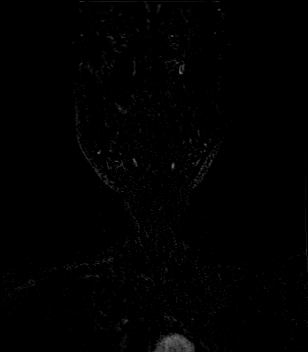
[im 16/96]
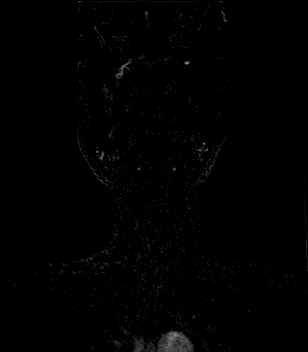
[im 21/96]
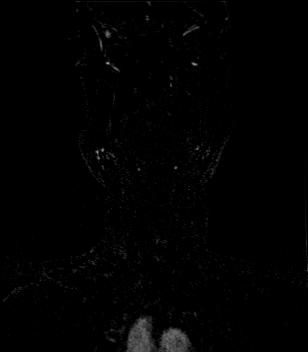
[im 26/96]
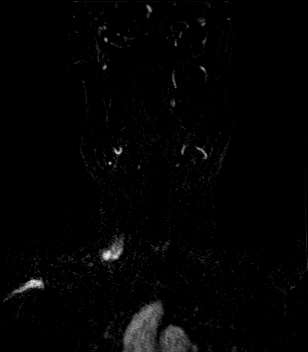
[im 31/96]
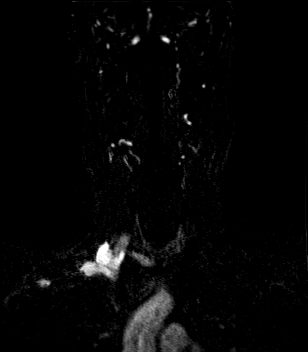
[im 36/96]
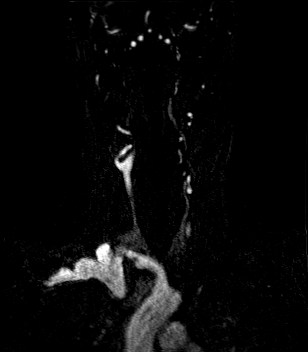
[im 41/96]
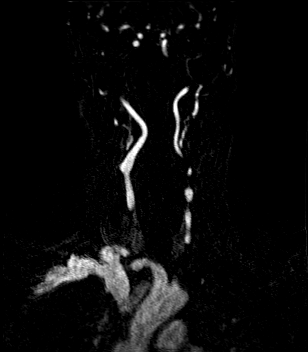
[im 46/96]
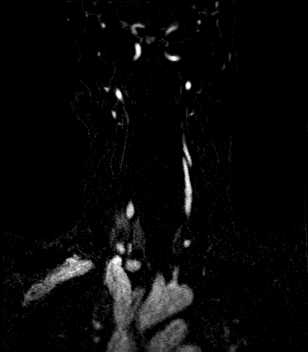
[im 51/96]
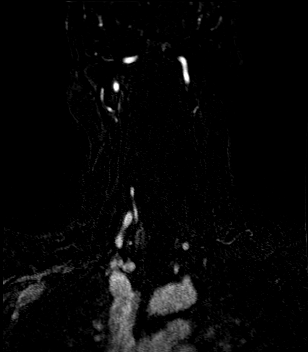
[im 56/96]
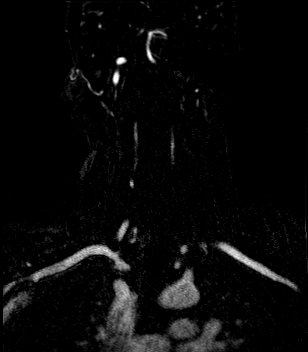
[im 61/96]
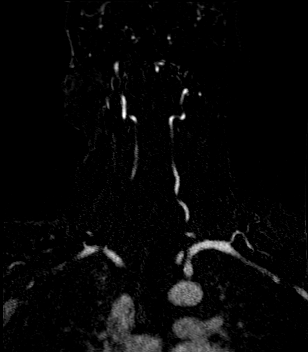
[im 66/96]
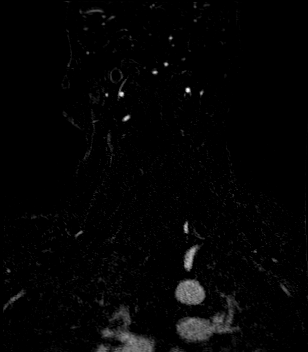
[im 71/96]
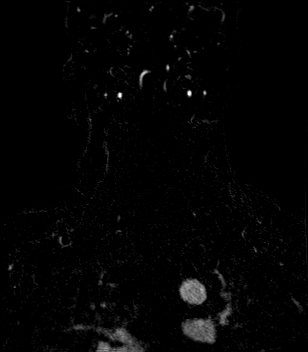
[im 76/96]
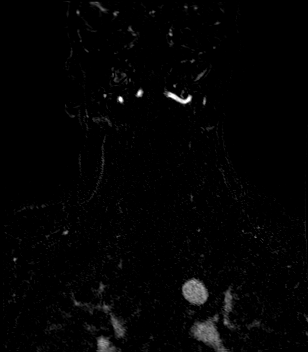
[im 81/96]
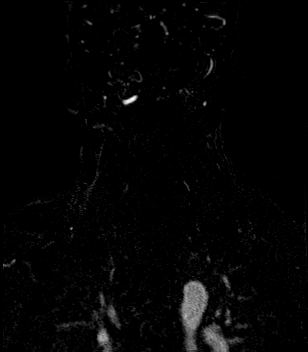
[im 86/96]
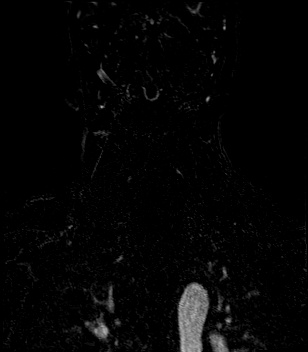
[im 91/96]
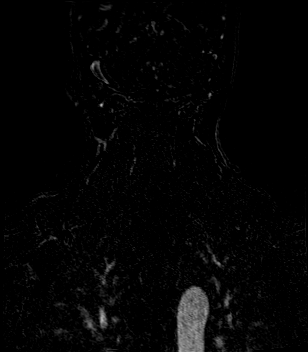
[im 96/96]
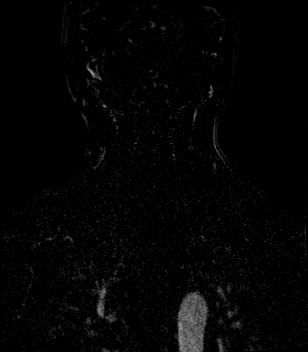

[20 of 48 positions shown; findings below may reference images not displayed]

FINDINGS: MRA NECK FINDINGS

Branching pattern from the arch is normal. Both common carotid
arteries appear widely patent to the bifurcation and normal. Left
bifurcation is low. No stenosis or irregularity at either carotid
bifurcation. Both cervical internal carotid arteries appear normal.
No sign of dissection.

Both vertebral artery origins are widely patent. Both vertebral
arteries appear normal through the cervical region to the foramen
magnum and basilar artery.

MRA HEAD FINDINGS

Both internal carotid arteries widely patent through the skull base
and siphon regions. The right internal carotid artery supplies both
anterior cerebral artery territories, the left A1 segment being
aplastic. Anterior and middle cerebral vessels show flow. Appears to
be mild irregularity of the distal right M1 segment and MCA
bifurcation region, without stenosis greater than 30-50% suspected.
More distal branch vessels are patent. There are a few scattered
stenoses within MCA branches on both sides.

Both vertebral arteries widely patent to the basilar. No basilar
stenosis. Posterior circulation branch vessels are patent. There is
considerable atherosclerotic narrowing and irregularity of the PCA
branches, right much worse than left.
IMPRESSION: No pathologic finding in the neck vessels. Incidental note of a low
carotid bifurcation on the left.

Considerable atherosclerotic narrowing and irregularity within the
posterior cerebral artery branches, worse on the right than the
left.

Atherosclerotic narrowing and irregularity of the distal right M1
segment and right MCA bifurcation region. No stenosis greater than
30-50% suspected. More distal MCA branch vessels on both sides show
some scattered stenoses.

## 2021-03-21 IMAGING — US US CAROTID DUPLEX BILAT
1 series · 13 of 24 positions shown · non-contrast
Comparison: None.

CLINICAL DATA: Acute focal neurological deficit. History of
hypertension and hyperlipidemia.

EXAM:
BILATERAL CAROTID DUPLEX ULTRASOUND
TECHNIQUE: Gray scale imaging, color Doppler and duplex ultrasound were
performed of bilateral carotid and vertebral arteries in the neck.

[Series 1: us carotid bilateral · 13 of 63 slices shown]
[im 1/63]
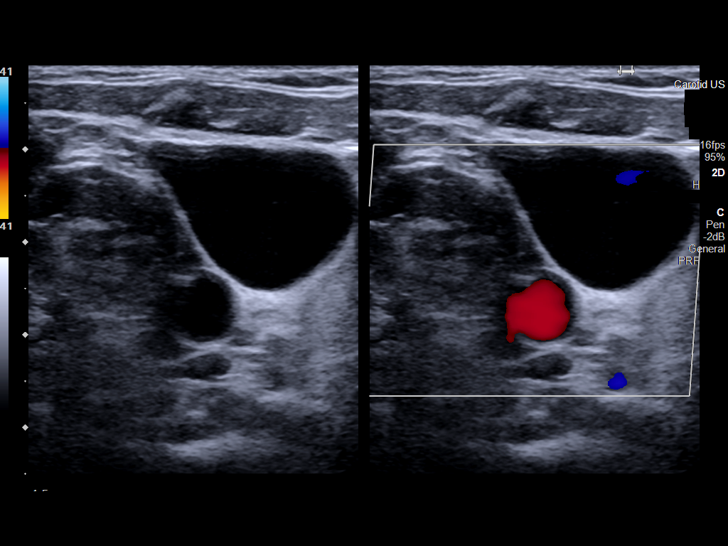
[im 6/63]
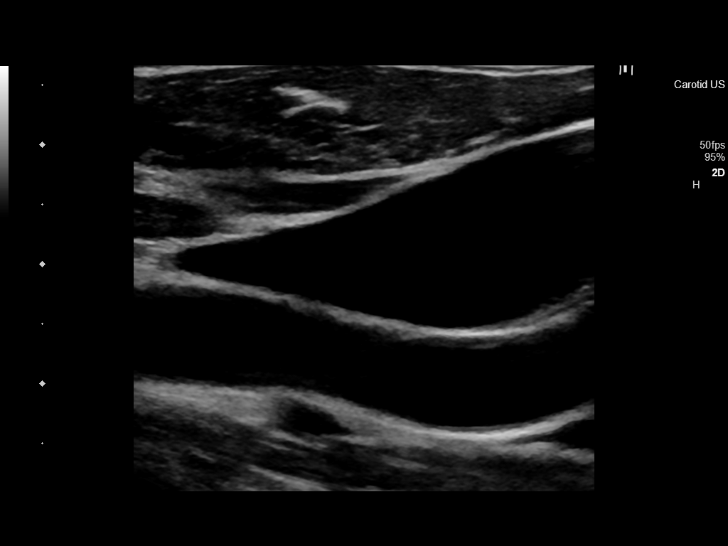
[im 11/63]
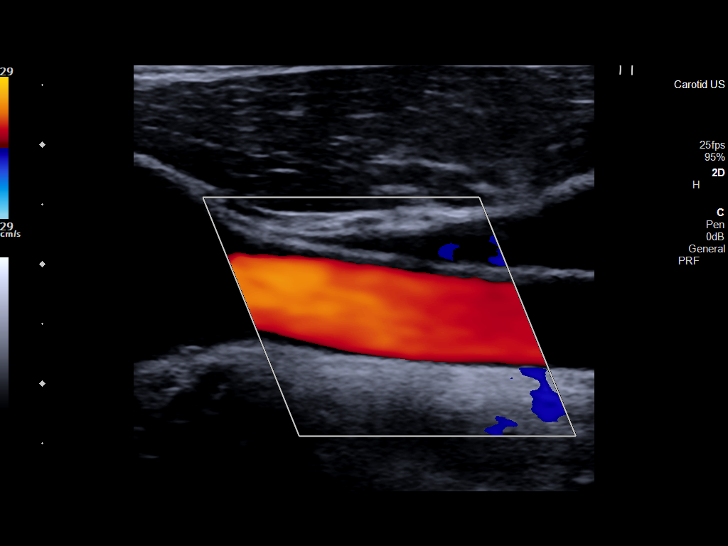
[im 17/63]
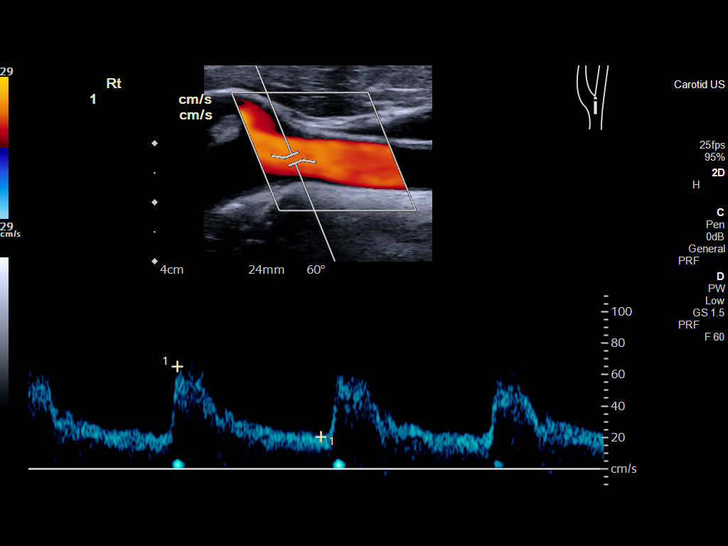
[im 22/63]
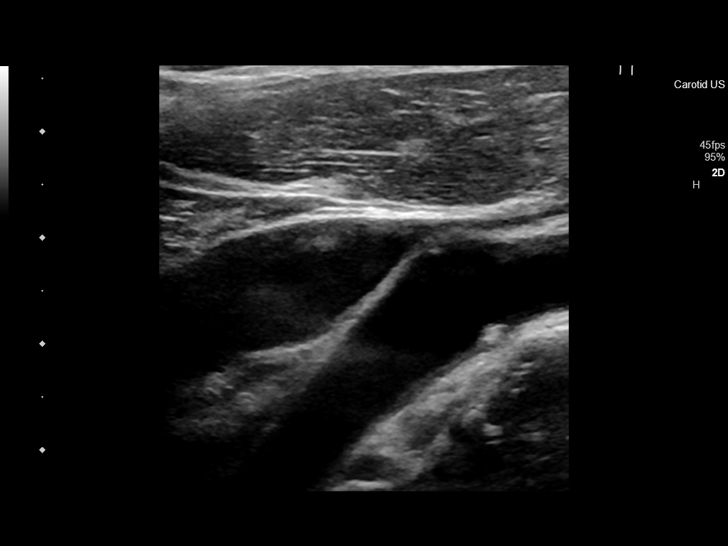
[im 27/63]
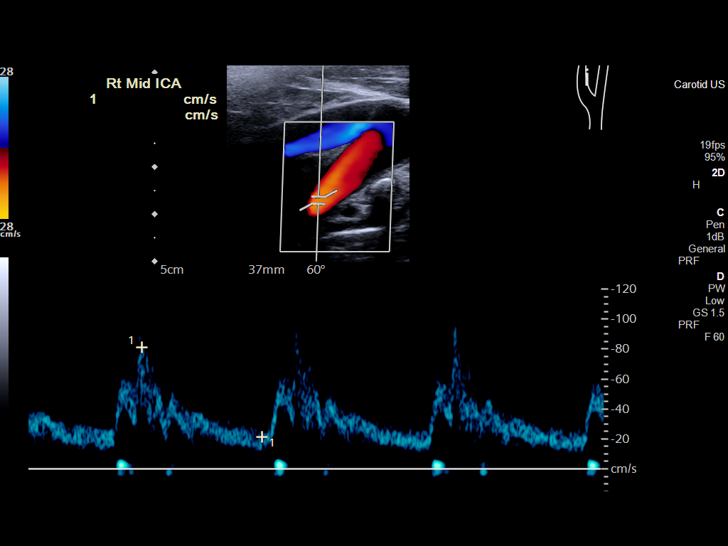
[im 33/63]
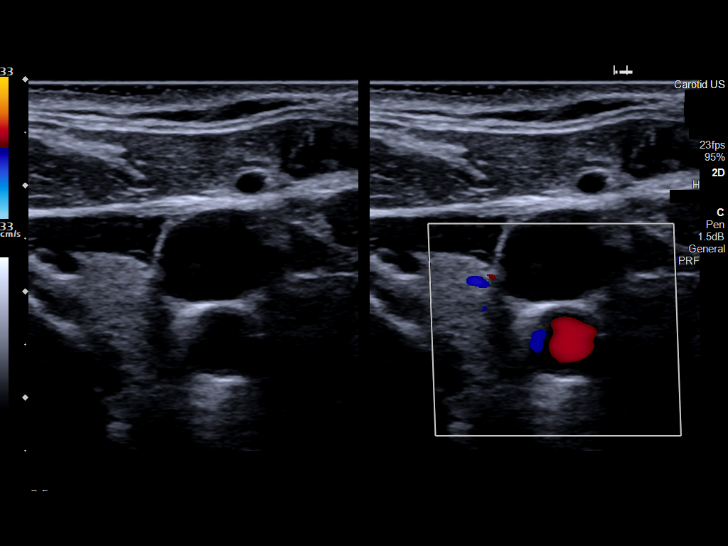
[im 36/63]
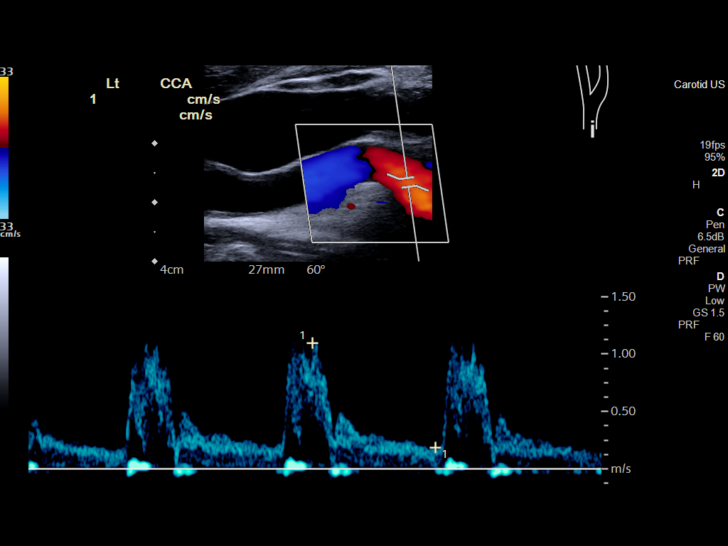
[im 41/63]
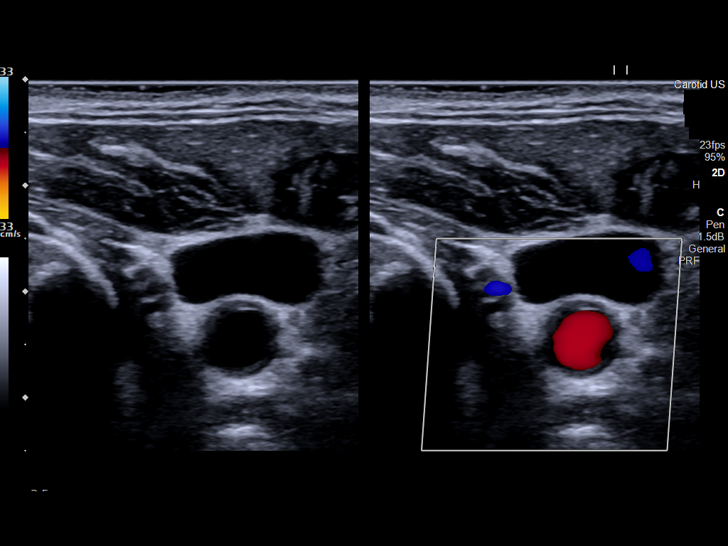
[im 46/63]
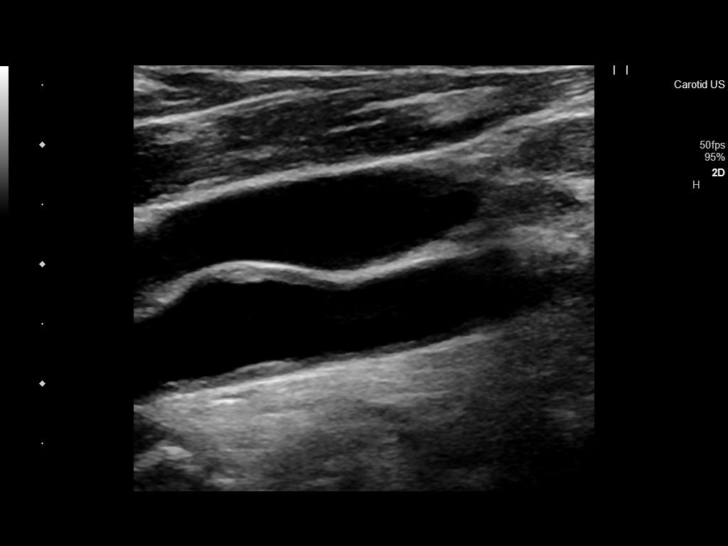
[im 52/63]
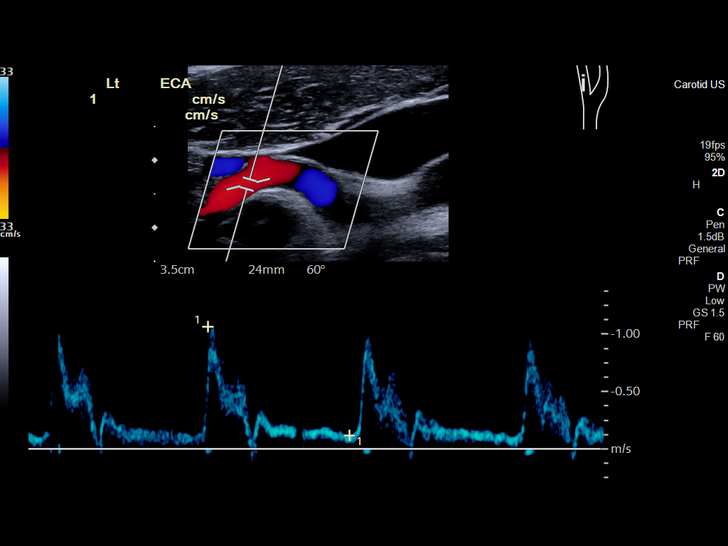
[im 57/63]
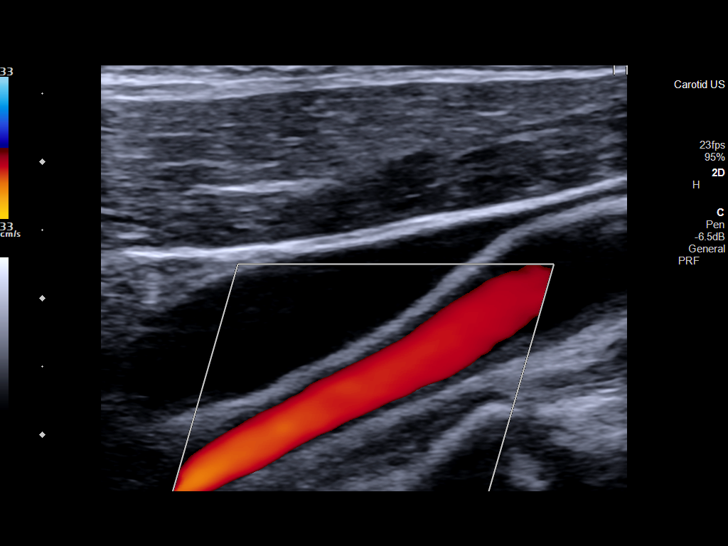
[im 63/63]
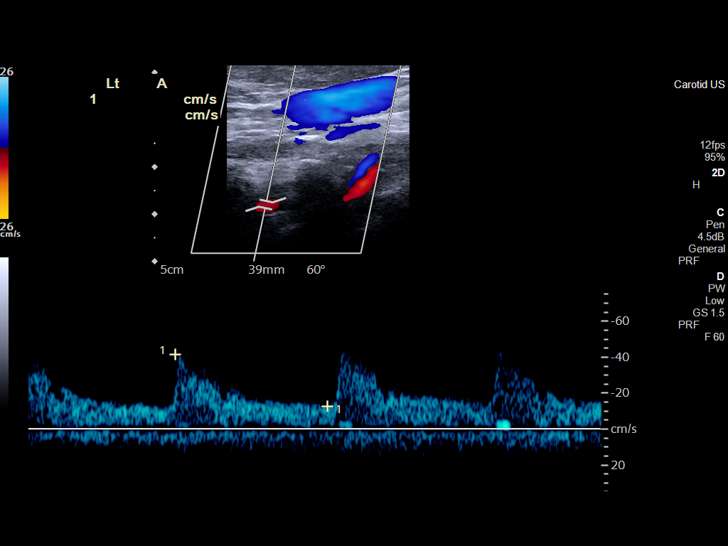

[13 of 24 positions shown; findings below may reference images not displayed]

FINDINGS: Criteria: Quantification of carotid stenosis is based on velocity
parameters that correlate the residual internal carotid diameter
with NASCET-based stenosis levels, using the diameter of the distal
internal carotid lumen as the denominator for stenosis measurement.

The following velocity measurements were obtained:

RIGHT

ICA: 89/17 cm/sec

CCA: 95/20 cm/sec

SYSTOLIC ICA/CCA RATIO:

ECA: 108 cm/sec

LEFT

ICA: 95/35 cm/sec

CCA: 110/19 cm/sec

SYSTOLIC ICA/CCA RATIO:

ECA: 106 cm/sec

RIGHT CAROTID ARTERY: There is a very minimal amount of intimal
thickening/atherosclerotic plaque within the right carotid bulb
(image 15), extending to involve the origin and proximal aspects the
right internal carotid artery (image 23), not resulting in elevated
peak systolic velocities within the interrogated course of the left
internal carotid artery to suggest a hemodynamically significant
stenosis.

RIGHT VERTEBRAL ARTERY:  Antegrade flow.

LEFT CAROTID ARTERY: There is a minimal amount of intimal
thickening/atherosclerotic plaque within the left carotid bulb
(image 47), extending to involve the origin and proximal aspects of
the left internal carotid artery (image 54), not resulting in
elevated peak systolic velocities within the interrogated course of
the left internal carotid artery to suggest a hemodynamically
significant stenosis.

LEFT VERTEBRAL ARTERY:  Antegrade flow
IMPRESSION: Minimal amount of bilateral intimal thickening/atherosclerotic
plaque, right subjectively greater than left, not resulting in a
hemodynamically significant stenosis within either internal carotid
artery.

## 2021-03-21 MED ORDER — MAGNESIUM SULFATE 4 GM/100ML IV SOLN
4.0000 g | Freq: Once | INTRAVENOUS | Status: AC
  Administered 2021-03-21: 12:00:00 4 g via INTRAVENOUS
  Filled 2021-03-21: qty 100

## 2021-03-21 MED ORDER — ASPIRIN 81 MG PO TBEC: 81.0000 mg | DELAYED_RELEASE_TABLET | Freq: Every day | ORAL | 0 refills | Status: AC

## 2021-03-21 MED ORDER — ATORVASTATIN CALCIUM 20 MG PO TABS: 40.0000 mg | ORAL_TABLET | Freq: Every day | ORAL | 2 refills | Status: DC

## 2021-03-21 MED ORDER — AMLODIPINE BESYLATE 2.5 MG PO TABS: 2.5000 mg | ORAL_TABLET | Freq: Every day | ORAL | 0 refills | Status: DC

## 2021-03-21 MED ORDER — GADOBUTROL 1 MMOL/ML IV SOLN
7.0000 mL | Freq: Once | INTRAVENOUS | Status: AC | PRN
  Administered 2021-03-21: 14:00:00 7 mL via INTRAVENOUS

## 2021-03-21 MED ORDER — ENOXAPARIN SODIUM 40 MG/0.4ML IJ SOSY: 40.0000 mg | PREFILLED_SYRINGE | INTRAMUSCULAR | Status: DC

## 2021-03-21 MED ORDER — ASPIRIN EC 81 MG PO TBEC: 81.0000 mg | DELAYED_RELEASE_TABLET | Freq: Every day | ORAL | Status: DC

## 2021-03-21 MED ORDER — CLOPIDOGREL BISULFATE 75 MG PO TABS: 75.0000 mg | ORAL_TABLET | Freq: Every day | ORAL | 0 refills | Status: AC

## 2021-03-21 MED ORDER — ATORVASTATIN CALCIUM 20 MG PO TABS: 40.0000 mg | ORAL_TABLET | Freq: Every day | ORAL | Status: DC

## 2021-03-21 NOTE — Progress Notes (Signed)
   03/21/21 0143  Vitals  Temp 98 F (36.7 C)  BP (!) 152/89  MAP (mmHg) 105  BP Location Left Leg  BP Method Automatic  Patient Position (if appropriate) Lying  Pulse Rate 70  Pulse Rate Source Monitor  Resp 16  MEWS COLOR  MEWS Score Color Green  Oxygen Therapy  SpO2 99 %  O2 Device Room Air  MEWS Score  MEWS Temp 0  MEWS Systolic 0  MEWS Pulse 0  MEWS RR 0  MEWS LOC 0  MEWS Score 0  Pt had a 12 beat run of wide qrs complex (vtach) at 0027. tele Notified at approx 0220. Rachael Fee made aware. Pt was asymptomatic. Vitals stable. No new orders received.

## 2021-03-21 NOTE — Progress Notes (Signed)
SLP Cancellation Note  Patient Details Name: Jimmy Montoya MRN: [id number] DOB: [birth year]   Cancelled treatment:       Reason Eval/Treat Not Completed: SLP screened, no needs identified, will sign off (chart reviewed; consulted NSG then met w/ pt during meal)  Pt denied any difficulty swallowing and is currently on a regular diet; tolerates swallowing pills w/ water per NSG. Pt eating his meal during screening w/out overt deficits noted. Pt conversed at conversational level w/ SLP w/out deficits noted; pt denied any current speech-language deficits. Speech clear, intelligible. No overt lingual/labial weakness noted during speech or when taking bites/sips. Pt described the events leading up to him coming to the Hospital appropriately.  No further skilled ST services indicated as pt appears at his baseline. Pt agreed. NSG to reconsult if any change in status while admitted.      Orinda Kenner, MS, CCC-SLP Speech Language Pathologist Rehab Services [phone] [facility] 03/21/2021, 9:17 AM

## 2021-03-21 NOTE — Progress Notes (Signed)
*  PRELIMINARY RESULTS* Echocardiogram 2D Echocardiogram has been performed.  [name] 03/21/2021, 12:48 PM

## 2021-03-21 NOTE — Evaluation (Signed)
Physical Therapy Evaluation Patient Details Name: Jimmy Montoya MRN: [id number] DOB: 04/05/1956 Today's Date: 03/21/2021   History of Present Illness  65 y.o. male with medical history significant of anemia, hyperlipidemia, inguinal hernia, leukopenia, vitiligo, testosterone deficiency who presents with sudden onset of left-sided numbness and tingling. 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  Clinical Impression  Pt did well with PT exam, he did show some minimal L sided weakness (as compared to R) but had functional strength and coordination.  His biggest complaint was the numbness in L face and UE.  Pt was able to circumambulate the nurses' station X 2 and negotiate up/down 7 steps, however he did have relatively consistent low grade buckling in the L that he was able to improve with focus and that did not cause overt safety issues.  He generally did well and showed ability to safely return home (wife retired and will be home as needed).  Pt is functional but not at his baseline, discussed benefit of outpt PT at discharge.      Follow Up Recommendations Outpatient PT    Equipment Recommendations  None recommended by PT    Recommendations for Other Services       Precautions / Restrictions Precautions Precautions: Fall Restrictions Weight Bearing Restrictions: No      Mobility  Bed Mobility Overal bed mobility: Modified Independent;Independent             General bed mobility comments: easily up to EOB, no assist    Transfers Overall transfer level: Needs assistance Equipment used: None Transfers: Sit to/from Stand Sit to Stand: Modified independent (Device/Increase time) Stand pivot transfers: Supervision          Ambulation/Gait Ambulation/Gait assistance: Modified independent (Device/Increase time) Gait Distance (Feet): 350 Feet Assistive device: None       General Gait Details: Pt able to maintain consistent cadence/speed  but did have regular L knee buckling that he easily self arrests, but that did cause change in gait and confidence.  No near LOBs or overt safety issues, but not at his baseline.  Cues and education regarding being more deliberate and aware with L LE.  Stairs Stairs: Yes Stairs assistance: Supervision Stair Management: One rail Left Number of Stairs: 7 General stair comments: Pt was able to ascend/descend steps with step-to strategy and minimal UE/rail use  Wheelchair Mobility    Modified Rankin (Stroke Patients Only)       Balance Overall balance assessment: Needs assistance Sitting-balance support: Feet supported Sitting balance-Leahy Scale: Normal     Standing balance support: During functional activity Standing balance-Leahy Scale: Fair Standing balance comment: Pt able to maintain static standing, eyes closed with moderate perturbations w/o issue.  b/l heel raises, needed light HHA assist, unable to maintain >2 seconds w/o UEs.  No LOBs during ambulation, but consistent mild L knee buckling at weight acceptance                             Pertinent Vitals/Pain Pain Assessment: No/denies pain    Home Living Family/patient expects to be discharged to:: Private residence Living Arrangements: Spouse/significant other Available Help at Discharge: Family;Available PRN/intermittently Type of Home: House Home Access: Stairs to enter Entrance Stairs-Rails: Right Entrance Stairs-Number of Steps: 4 Home Layout: One level Home Equipment: None      Prior Function Level of Independence: Independent         Comments: Pt works full time in  a body shop and is independent in all aspects of care and mobility PTA.     Hand Dominance   Dominant Hand: Right    Extremity/Trunk Assessment   Upper Extremity Assessment Upper Extremity Assessment: LUE deficits/detail LUE Deficits / Details: reports numbness/tingling from elbow to all 5 digits. Still functional but  with decreased sensation, speed and dexterity.  Pt is R handed, but clearly L weaker than R LUE Sensation: decreased light touch;decreased proprioception LUE Coordination: decreased fine motor    Lower Extremity Assessment Lower Extremity Assessment: Overall WFL for tasks assessed (L LE functional but weaker than R) LLE Sensation: decreased light touch;decreased proprioception       Communication   Communication: No difficulties  Cognition Arousal/Alertness: Awake/alert Behavior During Therapy: WFL for tasks assessed/performed Overall Cognitive Status: Within Functional Limits for tasks assessed                                 General Comments: A & O x4. Pt very pleasant and cooperative      General Comments General comments (skin integrity, edema, etc.): primary complains are L facial and UE numbness    Exercises     Assessment/Plan    PT Assessment Patient needs continued PT services  PT Problem List Decreased activity tolerance;Decreased coordination;Decreased safety awareness;Impaired sensation       PT Treatment Interventions Gait training;Stair training;Functional mobility training;Therapeutic activities;Balance training;Therapeutic exercise;Neuromuscular re-education;Patient/family education    PT Goals (Current goals can be found in the Care Plan section)  Acute Rehab PT Goals Patient Stated Goal: to go home PT Goal Formulation: With patient Time For Goal Achievement: 04/04/21 Potential to Achieve Goals: Good    Frequency Min 2X/week   Barriers to discharge        Co-evaluation               AM-PAC PT "6 Clicks" Mobility  Outcome Measure Help needed turning from your back to your side while in a flat bed without using bedrails?: None Help needed moving from lying on your back to sitting on the side of a flat bed without using bedrails?: None Help needed moving to and from a bed to a chair (including a wheelchair)?: None Help needed  standing up from a chair using your arms (e.g., wheelchair or bedside chair)?: None Help needed to walk in hospital room?: None Help needed climbing 3-5 steps with a railing? : None 6 Click Score: 24    End of Session Equipment Utilized During Treatment: Gait belt Activity Tolerance: Patient tolerated treatment well Patient left: with bed alarm set;with call bell/phone within reach Nurse Communication: Mobility status PT Visit Diagnosis: Muscle weakness (generalized) (M62.81);Difficulty in walking, not elsewhere classified (R26.2);Other symptoms and signs involving the nervous system (R29.898);Hemiplegia and hemiparesis Hemiplegia - Right/Left: Left Hemiplegia - dominant/non-dominant: Non-dominant Hemiplegia - caused by: Cerebral infarction    Time: 1111-1131 PT Time Calculation (min) (ACUTE ONLY): 20 min   Charges:   PT Evaluation $PT Eval Low Complexity: 1 Low PT Treatments $Gait Training: 8-22 mins        Kreg Shropshire, DPT 03/21/2021, 1:31 PM

## 2021-03-21 NOTE — Evaluation (Signed)
Occupational Therapy Evaluation Patient Details Name: Jimmy Montoya MRN: [id number] DOB: Apr 22, 1956 Today's Date: 03/21/2021    History of Present Illness 65 y.o. male with medical history significant of anemia, hyperlipidemia, inguinal hernia, leukopenia, vitiligo, testosterone deficiency who presents with sudden onset of left-sided numbness and tingling. 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   Clinical Impression   Patient presenting with decreased I in self care, balance, functional mobility/transfers, endurance, and safety awareness.  Patient reports being independent with all aspects of care and working full time PTA. Pt lives with wife at home. 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 Patient currently functioning at supervision level with multiple LOB when first getting up but able to self correct as session continues. Pt does exhibit and reports decreased sensation and coordination in L UE and LE. OT encouraged continued use of L UE and recommend outpt OT. Patient will benefit from acute OT to increase overall independence in the areas of ADLs, functional mobility, and safety awareness in order to safely discharge home.    Follow Up Recommendations  Outpatient OT    Equipment Recommendations  None recommended by OT       Precautions / Restrictions Precautions Precautions: Fall      Mobility Bed Mobility Overal bed mobility: Modified Independent                  Transfers Overall transfer level: Needs assistance Equipment used: None Transfers: Sit to/from Stand;Stand Pivot Transfers Sit to Stand: Supervision Stand pivot transfers: Supervision            Balance Overall balance assessment: Needs assistance Sitting-balance support: Feet supported Sitting balance-Leahy Scale: Normal     Standing balance support: During functional activity Standing balance-Leahy Scale: Fair Standing balance comment: several LOB  to the L but able to self correct- close supervision for safety                           ADL either performed or assessed with clinical judgement   ADL Overall ADL's : Needs assistance/impaired                   General ADL Comments: Pt able to functionally engage in tasks with increased time secondary to decreased coordination and sensation. Pt did have several LOB to the L initially but able to self correct without intervention. Close supervision for safety without use of AD.     Vision Patient Visual Report: No change from baseline Vision Assessment?: No apparent visual deficits            Pertinent Vitals/Pain Pain Assessment: No/denies pain     Hand Dominance Right   Extremity/Trunk Assessment Upper Extremity Assessment Upper Extremity Assessment: LUE deficits/detail LUE Deficits / Details: pt reports numbness/tingling from elbow to all 5 digits. Still functional but with decreased sensation, speed and dexterity. LUE Sensation: decreased light touch;decreased proprioception LUE Coordination: decreased fine motor   Lower Extremity Assessment Lower Extremity Assessment: Defer to PT evaluation;LLE deficits/detail LLE Sensation: decreased light touch;decreased proprioception       Communication Communication Communication: No difficulties   Cognition Arousal/Alertness: Awake/alert Behavior During Therapy: WFL for tasks assessed/performed Overall Cognitive Status: Within Functional Limits for tasks assessed                                 General  Comments: A & O x4. Pt very pleasant and cooperative              Home Living Family/patient expects to be discharged to:: Private residence Living Arrangements: Spouse/significant other Available Help at Discharge: Family;Available PRN/intermittently Type of Home: House Home Access: Stairs to enter [employer] of Steps: 4 Entrance Stairs-Rails: Right Home Layout: One  level     Bathroom Shower/Tub: Teacher, early years/pre: Standard     Home Equipment: None          Prior Functioning/Environment Level of Independence: Independent        Comments: Pt works full time in a body shop and is independent in all aspects of care and mobility PTA.        OT Problem List: Decreased strength;Impaired balance (sitting and/or standing);Decreased safety awareness;Decreased activity tolerance;Decreased coordination;Decreased knowledge of use of DME or AE;Impaired sensation;Impaired UE functional use      OT Treatment/Interventions: Self-care/ADL training;DME and/or AE instruction;Therapeutic activities;Balance training;Therapeutic exercise;Neuromuscular education;Energy conservation;Patient/family education;Modalities;Manual therapy    OT Goals(Current goals can be found in the care plan section) Acute Rehab OT Goals Patient Stated Goal: to go home OT Goal Formulation: With patient Time For Goal Achievement: 04/04/21 Potential to Achieve Goals: Good ADL Goals Pt Will Perform Grooming: Independently;standing Pt Will Perform Lower Body Dressing: Independently;sit to/from stand Pt Will Transfer to Toilet: Independently Pt Will Perform Toileting - Clothing Manipulation and hygiene: Independently Pt/caregiver will Perform Home Exercise Program: With theraputty;Increased strength;Left upper extremity;With written HEP provided;Independently  OT Frequency: Min 2X/week   Barriers to D/C:    none known at this time          AM-PAC OT "6 Clicks" Daily Activity     Outcome Measure Help from another person eating meals?: None Help from another person taking care of personal grooming?: None Help from another person toileting, which includes using toliet, bedpan, or urinal?: A Little Help from another person bathing (including washing, rinsing, drying)?: A Little Help from another person to put on and taking off regular upper body clothing?:  None Help from another person to put on and taking off regular lower body clothing?: A Little 6 Click Score: 21   End of Session Nurse Communication: Mobility status  Activity Tolerance: Patient tolerated treatment well Patient left: in bed;with call bell/phone within reach  OT Visit Diagnosis: Unsteadiness on feet (R26.81);Muscle weakness (generalized) (M62.81)                Time: 1610-9604 OT Time Calculation (min): 24 min Charges:  OT General Charges $OT Visit: 1 Visit OT Evaluation $OT Eval Low Complexity: 1 Low OT Treatments $Self Care/Home Management : 8-22 mins  Darleen Crocker, MS, OTR/L , CBIS ascom [phone]  03/21/21, 12:47 PM

## 2021-03-21 NOTE — TOC Progression Note (Signed)
Transition of Care [facility]) - Progression Note    Patient Details  Name: Jimmy Montoya MRN: [id number] Date of Birth: 1956/01/06  [facility] CM/SW [place], RN Phone Number: 03/21/2021, 3:07 PM  Clinical Narrative:   TOC in to see patient.  Patient lives at home with his wife, no current home health services.  Patient has no concerns about getting to appointments or getting medications.  Recommendation is for outpatient PT, referral sent to [facility].  No further questions, patient given TOC contact information, TOC will follow for needs.         Expected Discharge Plan and Services                                                 Social Determinants of Health (SDOH) Interventions    Readmission Risk Interventions No flowsheet data found.

## 2021-03-21 NOTE — Progress Notes (Signed)
Sit with patient today, interrupted by a phone call. Patient shared his scary experience that brought him to the hospital. He states he is feeling better and is looking forward to his life ahead. 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

## 2021-03-21 NOTE — Discharge Summary (Addendum)
PROGRESS NOTE  Jimmy Montoya  DOB: [birth year]  PCP: Adin Hector, MD XBJ:[id number]  DOA: 03/20/2021  LOS: 0 days  Hospital Day: 2   Chief Complaint  Patient presents with  . Numbness   Brief narrative: Jimmy Montoya is a 65 y.o. male with PMH significant for anemia, hyperlipidemia, inguinal hernia, leukopenia, vitiligo, testosterone deficiency. Patient presented to the ED with sudden onset of numbness and tingling on the left arm, leg, face while driving back home from work.  He went to the urgent care and was subsequently sent to ED for further evaluation and management. In the ED, patient was afebrile, blood pressure elevated 160/100 Blood work with potassium low at 3.3, magnesium low at 1.4, troponin elevated to 22. Initial CT scan of head was negative but MRI brain showed 1 cm acute ischemic nonhemorrhagic infarct involving the right thalamocapsular region. Patient was admitted to hospitalist service for further stroke work-up.  Subjective: Patient was seen and examined this morning.  Pleasant middle-aged African-American male.  Propped up in bed.  Not in distress.  Continues to have numbness and tingling on the left side.  Assessment/Plan: Acute ischemic stroke -Patient presented with sudden onset numbness and tingling of the left side with some heaviness and weakness developing in the ED. -Initial CT scan of head was negative but MRI brain showed 1 cm acute ischemic nonhemorrhagic infarct involving the right thalamocapsular region. -Admitted for stroke work-up -Carotid duplex bilateral without any significant stenosis. -Pending echocardiogram report -HDL low at 28, LDL at 99 -Neurology, PT/OT eval obtained. -Per neurology recommendation, we'll discharge on plavix 75mg  daily + ASA 81mg  daily x30 days f/b just ASA 81mg  daily after that. To f/u with neurology as an outpatient.  Hypertension -No history of hypertension, blood pressure elevated to 160s in the ED. -Permissive  hypertension allowed for first 48 to 72 hours.  -Based on the BP readings, I will discharge him on amlodipine 2.5 mg daily starting tomorrow.   Demand ischemia -Troponin mildly elevated.  No anginal symptoms. -Pending echocardiogram Recent Labs    03/20/21 2015 03/20/21 2257  TROPONINIHS 22* 28*   Hypokalemia/hypomagnesemia -Potassium low at 3.3 on admission.  Replacement given.  Magnesium low at 1.4.  Replacement given. Recent Labs  Lab 03/20/21 2015 03/20/21 2016  K 3.3*  --   MG  --  1.4*     Wound care: Incision (Closed) 12/23/15 Groin Right (Active)  Date First Assessed/Time First Assessed: 12/23/15 1451   Location: Groin  Location Orientation: Right    Assessments 12/23/2015  3:14 PM 12/23/2015  5:13 PM  Dressing Type Liquid skin adhesive Liquid skin adhesive  Site / Wound Assessment -- Clean;Dry  Drainage Amount -- None     No Linked orders to display    Discharge Exam:   Vitals:   03/21/21 0739 03/21/21 1156 03/21/21 1625 03/21/21 1650  BP: (!) 153/94 (!) 146/93 (!) 154/100 (!) 154/94  Pulse: 70 65 66 66  Resp: 17 17 17    Temp: (!) 97.5 F (36.4 C) 97.9 F (36.6 C) 97.8 F (36.6 C)   TempSrc: Oral Oral    SpO2: 99% 99% 99%   Weight:      Height:        Body mass index is 27.41 kg/m.  General exam: pleasant, not in distress.  Skin: No rashes, lesions or ulcers. HEENT: Atraumatic, normocephalic, no obvious bleeding Lungs: CTAB CVS: regular rate and rhytm GI/Abd soft, nontender, nondistended, BS+ CNS: AAOX3, still has mild tingling.  Psychiatry: mood appropriate Extremities: no pedal edema, no calf tenderness.  Follow ups:   Discharge Instructions    Diet - low sodium heart healthy   Complete by: As directed    Increase activity slowly   Complete by: As directed       Follow-up Information    Adin Hector, MD Follow up.   Specialty: Internal Medicine Contact information: [place]  [id number] [phone]        [facility] Follow up.   Contact information: [facility] [phone]              Recommendations for Outpatient Follow-Up:   1. F/u with PCP/neurology as an outpatient.  Discharge Instructions:  Follow with Primary MD Adin Hector, MD in 7 days   Get CBC/BMP checked in next visit within 1 week by PCP or SNF MD ( we routinely change or add medications that can affect your baseline labs and fluid status, therefore we recommend that you get the mentioned basic workup next visit with your PCP, your PCP may decide not to get them or add new tests based on their clinical decision)  On your next visit with your PCP, please Get Medicines reviewed and adjusted.  Please request your PCP  to go over all Hospital Tests and Procedure/Radiological results at the follow up, please get all Hospital records sent to your Prim MD by signing hospital release before you go home.  Activity: As tolerated with Full fall precautions use walker/cane & assistance as needed  For Heart failure patients - Check your Weight same time everyday, if you gain over 2 pounds, or you develop in leg swelling, experience more shortness of breath or chest pain, call your Primary MD immediately. Follow Cardiac Low Salt Diet and 1.5 lit/day fluid restriction.  If you have smoked or chewed Tobacco in the last 2 yrs please stop smoking, stop any regular Alcohol  and or any Recreational drug use.  If you experience worsening of your admission symptoms, develop shortness of breath, life threatening emergency, suicidal or homicidal thoughts you must seek medical attention immediately by calling 911 or calling your MD immediately  if symptoms less severe.  You Must read complete instructions/literature along with all the possible adverse reactions/side effects for all the Medicines you take and that have been prescribed  to you. Take any new Medicines after you have completely understood and accpet all the possible adverse reactions/side effects.   Do not drive, operate heavy machinery, perform activities at heights, swimming or participation in water activities or provide baby sitting services if your were admitted for syncope or siezures until you have seen by Primary MD or a Neurologist and advised to do so again.  Do not drive when taking Pain medications.  Do not take more than prescribed Pain, Sleep and Anxiety Medications  Wear Seat belts while driving.   Please note You were cared for by a hospitalist during your hospital stay. If you have any questions about your discharge medications or the care you received while you were in the hospital after you are discharged, you can call the unit and asked to speak with the hospitalist on call if the hospitalist that took care of you is not available. Once you are discharged, your primary care physician will handle any further medical issues. Please note that NO REFILLS for any discharge medications will be authorized  once you are discharged, as it is imperative that you return to your primary care physician (or establish a relationship with a primary care physician if you do not have one) for your aftercare needs so that they can reassess your need for medications and monitor your lab values.    Allergies as of 03/21/2021   No Known Allergies     Medication List    STOP taking these medications   meloxicam 15 MG tablet Commonly known as: MOBIC     TAKE these medications   amLODipine 2.5 MG tablet Commonly known as: NORVASC Take 1 tablet (2.5 mg total) by mouth daily.   aspirin 81 MG EC tablet Take 1 tablet (81 mg total) by mouth daily. Swallow whole. Start taking on: Mar 22, 2021   atorvastatin 20 MG tablet Commonly known as: LIPITOR Take 2 tablets (40 mg total) by mouth at bedtime. Start taking on: Mar 22, 2021   clopidogrel 75 MG  tablet Commonly known as: Plavix Take 1 tablet (75 mg total) by mouth daily.   sildenafil 50 MG tablet Commonly known as: VIAGRA Take 50 mg by mouth as needed.   traZODone 50 MG tablet Commonly known as: DESYREL Take 50 mg by mouth at bedtime.       Time coordinating discharge: 35 minutes  The results of significant diagnostics from this hospitalization (including imaging, microbiology, ancillary and laboratory) are listed below for reference.    Procedures and Diagnostic Studies:   MR ANGIO HEAD WO CONTRAST  Result Date: 03/21/2021 CLINICAL DATA:  1 cm acute stroke right thalamus capsular region. Question vascular dissection. EXAM: MRA NECK WITHOUT AND WITH CONTRAST MRI HEAD WITHOUT CONTRAST TECHNIQUE: Multiplanar and multiecho pulse sequences of the neck were obtained without and with intravenous contrast. Angiographic images of the neck were obtained using MRA technique without and with intravenous contrast; Angiographic images of the Circle of Willis were obtained using MRA technique without intravenous contrast. CONTRAST:  4mL GADAVIST GADOBUTROL 1 MMOL/ML IV SOLN COMPARISON:  MRI head earlier same day FINDINGS: MRA NECK FINDINGS Branching pattern from the arch is normal. Both common carotid arteries appear widely patent to the bifurcation and normal. Left bifurcation is low. No stenosis or irregularity at either carotid bifurcation. Both cervical internal carotid arteries appear normal. No sign of dissection. Both vertebral artery origins are widely patent. Both vertebral arteries appear normal through the cervical region to the foramen magnum and basilar artery. MRA HEAD FINDINGS Both internal carotid arteries widely patent through the skull base and siphon regions. The right internal carotid artery supplies both anterior cerebral artery territories, the left A1 segment being aplastic. Anterior and middle cerebral vessels show flow. Appears to be mild irregularity of the distal right M1  segment and MCA bifurcation region, without stenosis greater than 30-50% suspected. More distal branch vessels are patent. There are a few scattered stenoses within MCA branches on both sides. Both vertebral arteries widely patent to the basilar. No basilar stenosis. Posterior circulation branch vessels are patent. There is considerable atherosclerotic narrowing and irregularity of the PCA branches, right much worse than left. IMPRESSION: No pathologic finding in the neck vessels. Incidental note of a low carotid bifurcation on the left. Considerable atherosclerotic narrowing and irregularity within the posterior cerebral artery branches, worse on the right than the left. Atherosclerotic narrowing and irregularity of the distal right M1 segment and right MCA bifurcation region. No stenosis greater than 30-50% suspected. More distal MCA branch vessels on both sides show some scattered  stenoses. Electronically Signed   By: Nelson Chimes M.D.   On: 03/21/2021 14:56   MR ANGIO NECK W WO CONTRAST  Result Date: 03/21/2021 CLINICAL DATA:  1 cm acute stroke right thalamus capsular region. Question vascular dissection. EXAM: MRA NECK WITHOUT AND WITH CONTRAST MRI HEAD WITHOUT CONTRAST TECHNIQUE: Multiplanar and multiecho pulse sequences of the neck were obtained without and with intravenous contrast. Angiographic images of the neck were obtained using MRA technique without and with intravenous contrast; Angiographic images of the Circle of Willis were obtained using MRA technique without intravenous contrast. CONTRAST:  52mL GADAVIST GADOBUTROL 1 MMOL/ML IV SOLN COMPARISON:  MRI head earlier same day FINDINGS: MRA NECK FINDINGS Branching pattern from the arch is normal. Both common carotid arteries appear widely patent to the bifurcation and normal. Left bifurcation is low. No stenosis or irregularity at either carotid bifurcation. Both cervical internal carotid arteries appear normal. No sign of dissection. Both vertebral  artery origins are widely patent. Both vertebral arteries appear normal through the cervical region to the foramen magnum and basilar artery. MRA HEAD FINDINGS Both internal carotid arteries widely patent through the skull base and siphon regions. The right internal carotid artery supplies both anterior cerebral artery territories, the left A1 segment being aplastic. Anterior and middle cerebral vessels show flow. Appears to be mild irregularity of the distal right M1 segment and MCA bifurcation region, without stenosis greater than 30-50% suspected. More distal branch vessels are patent. There are a few scattered stenoses within MCA branches on both sides. Both vertebral arteries widely patent to the basilar. No basilar stenosis. Posterior circulation branch vessels are patent. There is considerable atherosclerotic narrowing and irregularity of the PCA branches, right much worse than left. IMPRESSION: No pathologic finding in the neck vessels. Incidental note of a low carotid bifurcation on the left. Considerable atherosclerotic narrowing and irregularity within the posterior cerebral artery branches, worse on the right than the left. Atherosclerotic narrowing and irregularity of the distal right M1 segment and right MCA bifurcation region. No stenosis greater than 30-50% suspected. More distal MCA branch vessels on both sides show some scattered stenoses. Electronically Signed   By: Nelson Chimes M.D.   On: 03/21/2021 14:56   MR BRAIN WO CONTRAST  Result Date: 03/21/2021 CLINICAL DATA:  Initial evaluation for acute neuro deficit, stroke suspected EXAM: MRI HEAD WITHOUT CONTRAST TECHNIQUE: Multiplanar, multiecho pulse sequences of the brain and surrounding structures were obtained without intravenous contrast. COMPARISON:  Prior CT from 03/20/2021. FINDINGS: Brain: Cerebral volume within normal limits. No significant cerebral white matter disease for age. 1 cm focus of restricted diffusion seen involving the  posterior right thalamocapsular region, consistent with an acute ischemic infarct. No associated hemorrhage or mass effect. No other evidence for acute or subacute ischemia. Gray-white matter differentiation otherwise maintained. No other encephalomalacia to suggest chronic cortical infarction. No other evidence for acute or chronic intracranial hemorrhage. No mass lesion, midline shift or mass effect. No hydrocephalus or extra-axial fluid collection. Pituitary gland suprasellar region within normal limits. Midline structures intact. Vascular: Major intracranial vascular flow voids are maintained. Skull and upper cervical spine: Craniocervical junction within normal limits. Bone marrow signal intensity normal. No scalp soft tissue abnormality. Sinuses/Orbits: Globes and orbital soft tissues within normal limits. Mild scattered mucosal thickening noted within the ethmoidal air cells and maxillary sinuses. Paranasal sinuses are otherwise clear. No mastoid effusion. Inner ear structures grossly normal. Other: None. IMPRESSION: 1. 1 cm acute ischemic nonhemorrhagic infarct involving the right thalamocapsular region.  2. Otherwise normal brain MRI for age. Electronically Signed   By: Jeannine Boga M.D.   On: 03/21/2021 02:47   US Carotid Bilateral (at [facility] and [facility] only)  Result Date: 03/21/2021 CLINICAL DATA:  Acute focal neurological deficit. History of hypertension and hyperlipidemia. EXAM: BILATERAL CAROTID DUPLEX ULTRASOUND TECHNIQUE: Pearline Cables scale imaging, color Doppler and duplex ultrasound were performed of bilateral carotid and vertebral arteries in the neck. COMPARISON:  None. FINDINGS: Criteria: Quantification of carotid stenosis is based on velocity parameters that correlate the residual internal carotid diameter with NASCET-based stenosis levels, using the diameter of the distal internal carotid lumen as the denominator for stenosis measurement. The following velocity measurements were obtained: RIGHT  ICA: 89/17 cm/sec CCA: 17/51 cm/sec SYSTOLIC ICA/CCA RATIO:  1.4 ECA: 108 cm/sec LEFT ICA: 95/35 cm/sec CCA: 025/85 cm/sec SYSTOLIC ICA/CCA RATIO:  1.6 ECA: 106 cm/sec RIGHT CAROTID ARTERY: There is a very minimal amount of intimal thickening/atherosclerotic plaque within the right carotid bulb (image 15), extending to involve the origin and proximal aspects the right internal carotid artery (image 23), not resulting in elevated peak systolic velocities within the interrogated course of the left internal carotid artery to suggest a hemodynamically significant stenosis. RIGHT VERTEBRAL ARTERY:  Antegrade flow. LEFT CAROTID ARTERY: There is a minimal amount of intimal thickening/atherosclerotic plaque within the left carotid bulb (image 47), extending to involve the origin and proximal aspects of the left internal carotid artery (image 54), not resulting in elevated peak systolic velocities within the interrogated course of the left internal carotid artery to suggest a hemodynamically significant stenosis. LEFT VERTEBRAL ARTERY:  Antegrade flow IMPRESSION: Minimal amount of bilateral intimal thickening/atherosclerotic plaque, right subjectively greater than left, not resulting in a hemodynamically significant stenosis within either internal carotid artery. Electronically Signed   By: Sandi Mariscal M.D.   On: 03/21/2021 07:39     Labs:   Basic Metabolic Panel: Recent Labs  Lab 03/20/21 2015 03/20/21 2016  NA 139  --   K 3.3*  --   CL 103  --   CO2 28  --   GLUCOSE 100*  --   BUN 12  --   CREATININE 1.02  --   CALCIUM 8.9  --   MG  --  1.4*   GFR Estimated Creatinine Clearance: 73.9 mL/min (by C-G formula based on SCr of 1.02 mg/dL). Liver Function Tests: Recent Labs  Lab 03/20/21 2015  AST 24  ALT 18  ALKPHOS 89  BILITOT 0.7  PROT 7.4  ALBUMIN 4.2   No results for input(s): LIPASE, AMYLASE in the last 168 hours. No results for input(s): AMMONIA in the last 168 hours. Coagulation  profile Recent Labs  Lab 03/20/21 2015  INR 1.0    CBC: Recent Labs  Lab 03/20/21 2015  WBC 4.2  NEUTROABS 2.4  HGB 11.3*  HCT 33.6*  MCV 87.7  PLT 146*   Cardiac Enzymes: No results for input(s): CKTOTAL, CKMB, CKMBINDEX, TROPONINI in the last 168 hours. BNP: Invalid input(s): POCBNP CBG: No results for input(s): GLUCAP in the last 168 hours. D-Dimer No results for input(s): DDIMER in the last 72 hours. Hgb A1c No results for input(s): HGBA1C in the last 72 hours. Lipid Profile Recent Labs    03/21/21 0601  CHOL 176  HDL 28*  LDLCALC 99  TRIG 244*  CHOLHDL 6.3   Thyroid function studies No results for input(s): TSH, T4TOTAL, T3FREE, THYROIDAB in the last 72 hours.  Invalid input(s): FREET3 Anemia work up No  results for input(s): VITAMINB12, FOLATE, FERRITIN, TIBC, IRON, RETICCTPCT in the last 72 hours. Microbiology Recent Results (from the past 240 hour(s))  Resp Panel by RT-PCR (Flu A&B, Covid) Nasopharyngeal Swab     Status: None   Collection Time: 03/20/21  8:15 PM   Specimen: Nasopharyngeal Swab; Nasopharyngeal(NP) swabs in vial transport medium  Result Value Ref Range Status   SARS Coronavirus 2 by RT PCR NEGATIVE NEGATIVE Final    Comment: (NOTE) SARS-CoV-2 target nucleic acids are NOT DETECTED.  The SARS-CoV-2 RNA is generally detectable in upper respiratory specimens during the acute phase of infection. The lowest concentration of SARS-CoV-2 viral copies this assay can detect is 138 copies/mL. A negative result does not preclude SARS-Cov-2 infection and should not be used as the sole basis for treatment or other patient management decisions. A negative result may occur with  improper specimen collection/handling, submission of specimen other than nasopharyngeal swab, presence of viral mutation(s) within the areas targeted by this assay, and inadequate number of viral copies(<138 copies/mL). A negative result must be combined with clinical  observations, patient history, and epidemiological information. The expected result is Negative.  Fact Sheet for Patients:  EntrepreneurPulse.com.au  Fact Sheet for Healthcare Providers:  IncredibleEmployment.be  This test is no t yet approved or cleared by the [place] FDA and  has been authorized for detection and/or diagnosis of SARS-CoV-2 by FDA under an Emergency Use Authorization (EUA). This EUA will remain  in effect (meaning this test can be used) for the duration of the COVID-19 declaration under Section 564(b)(1) of the Act, 21 U.S.C.section 360bbb-3(b)(1), unless the authorization is terminated  or revoked sooner.       Influenza A by PCR NEGATIVE NEGATIVE Final   Influenza B by PCR NEGATIVE NEGATIVE Final    Comment: (NOTE) The Xpert Xpress SARS-CoV-2/FLU/RSV plus assay is intended as an aid in the diagnosis of influenza from Nasopharyngeal swab specimens and should not be used as a sole basis for treatment. Nasal washings and aspirates are unacceptable for Xpert Xpress SARS-CoV-2/FLU/RSV testing.  Fact Sheet for Patients: EntrepreneurPulse.com.au  Fact Sheet for Healthcare Providers: IncredibleEmployment.be  This test is not yet approved or cleared by the [place] FDA and has been authorized for detection and/or diagnosis of SARS-CoV-2 by FDA under an Emergency Use Authorization (EUA). This EUA will remain in effect (meaning this test can be used) for the duration of the COVID-19 declaration under Section 564(b)(1) of the Act, 21 U.S.C. section 360bbb-3(b)(1), unless the authorization is terminated or revoked.  Performed at [facility], [address] [place], [place] [address]      Signed: Terrilee Croak  [facility] 03/21/2021, 5:05 PM

## 2021-03-21 NOTE — Consult Note (Signed)
NEUROLOGY CONSULTATION NOTE   Date of service: Mar 21, 2021 Patient Name: Jimmy Montoya MRN:  [id number] DOB:  [birth year] Reason for consult: R thalamic infarct _ _ _   _ __   _ __ _ _  __ __   _ __   __ _  History of Present Illness   65 yo man with hx HL presented 03/20/21 with L face arm and leg paresthesias. He was seen by telestroke at that time who gave him NIHSS of 0 bc he did not have true sensory deficit. He did not meet criteria for tPA 2/2 mild non-disabling sx.  Stroke w/u this admission:  MRI brain wo 1 cm acute ischemic nonhemorrhagic infarct involving the right thalamocapsular region.  MRA H&N No pathologic finding in the neck vessels. Incidental note of a low carotid bifurcation on the left.  Considerable atherosclerotic narrowing and irregularity within the posterior cerebral artery branches, worse on the right than the left.  Atherosclerotic narrowing and irregularity of the distal right M1 segment and right MCA bifurcation region. No stenosis greater than 30-50% suspected. More distal MCA branch vessels on both sides show some scattered stenoses.  TTE  1. Left ventricular ejection fraction, by estimation, is 40 to 45%. The  left ventricle has normal function. The left ventricle demonstrates  regional wall motion abnormalities (see scoring diagram/findings for  description). The left ventricular internal  cavity size was mildly dilated. Left ventricular diastolic parameters were  normal.  2. Right ventricular systolic function is normal. The right ventricular  size is normal.  3. The mitral valve is normal in structure. Trivial mitral valve  regurgitation.  4. The aortic valve is normal in structure. Aortic valve regurgitation is  not visualized.    ROS   10 point review of systems was performed and was negative except as described in HPI.  Past History   Past Medical History:  Diagnosis Date  . Anemia 03/20/2021  . Flu DEC 2016  . Hernia,  inguinal, right 09/02/2015  . Hyperlipemia   . Kidney stone    Past Surgical History:  Procedure Laterality Date  . HERNIA REPAIR    . INGUINAL HERNIA REPAIR Right 12/23/2015   Procedure: HERNIA REPAIR INGUINAL ADULT;  Surgeon: Leonie Green, MD;  Location: [facility] ORS;  Service: General;  Laterality: Right;   Family History  Problem Relation Age of Onset  . Breast cancer Mother   . CAD Brother    Social History   Socioeconomic History  . Marital status: Married    Spouse name: Not on file  . Number of children: Not on file  . Years of education: Not on file  . Highest education level: Not on file  Occupational History  . Not on file  Tobacco Use  . Smoking status: Never Smoker  . Smokeless tobacco: Never Used  Vaping Use  . Vaping Use: Never used  Substance and Sexual Activity  . Alcohol use: Yes    Comment: 1-2 per month  . Drug use: No  . Sexual activity: Not on file  Other Topics Concern  . Not on file  Social History Narrative  . Not on file   Social Determinants of Health   Financial Resource Strain: Not on file  Food Insecurity: Not on file  Transportation Needs: Not on file  Physical Activity: Not on file  Stress: Not on file  Social Connections: Not on file   No Known Allergies  Medications   Medications Prior to Admission  Medication Sig Dispense Refill Last Dose  . meloxicam (MOBIC) 15 MG tablet Take 1 tablet by mouth daily as needed.   prn at prn  . sildenafil (VIAGRA) 50 MG tablet Take 50 mg by mouth as needed.   prn at prn  . traZODone (DESYREL) 50 MG tablet Take 50 mg by mouth at bedtime.   Past Week at Unknown time     Vitals   Vitals:   03/21/21 0739 03/21/21 1156 03/21/21 1625 03/21/21 1650  BP: (!) 153/94 (!) 146/93 (!) 154/100 (!) 154/94  Pulse: 70 65 66 66  Resp: 17 17 17    Temp: (!) 97.5 F (36.4 C) 97.9 F (36.6 C) 97.8 F (36.6 C)   TempSrc: Oral Oral    SpO2: 99% 99% 99%   Weight:      Height:         Body mass  index is 27.41 kg/m.  Physical Exam   Physical Exam Gen: A&O x4, NAD HEENT: Atraumatic, normocephalic;mucous membranes moist; oropharynx clear, tongue without atrophy or fasciculations. Neck: Supple, trachea midline. Resp: CTAB, no w/r/r CV: RRR, no m/g/r; nml S1 and S2. 2+ symmetric peripheral pulses. Abd: soft/NT/ND; nabs x 4 quad Extrem: Nml bulk; no cyanosis, clubbing, or edema.  Neuro: *MS: A&O x4. Follows multi-step commands.  *Speech: fluid, nondysarthric, able to name and repeat *CN:    I: Deferred   II,III: PERRLA, VFF by confrontation, optic discs sharp   III,IV,VI: EOMI w/o nystagmus, no ptosis   V: paresthesias L face   VII: Eyelid closure was full.  Smile symmetric.   VIII: Hearing intact to voice   IX,X: Voice normal, palate elevates symmetrically    XI: SCM/trap 5/5 bilat   XII: Tongue protrudes midline, no atrophy or fasciculations   *Motor:   Normal bulk.  No tremor, rigidity or bradykinesia. No pronator drift.    Strength: Dlt Bic Tri WrE WrF FgS Gr HF KnF KnE PlF DoF    Left 5 5 5 5 5 5 5 5 5 5 5 5     Right 5 5 5 5 5 5 5 5 5 5 5 5     *Sensory:Paresthesias LUE, otherwise intact. No double-simultaneous extinction.  *Coordination:  Finger-to-nose, heel-to-shin, rapid alternating motions were intact. *Reflexes:  2+ and symmetric throughout without clonus; toes down-going bilat *Gait: normal base, normal stride, normal turn. Negative Romberg.  NIHSS = 0 (paresthesias but no true sensory deficit)   Labs   CBC:  Recent Labs  Lab 03/20/21 2015  WBC 4.2  NEUTROABS 2.4  HGB 11.3*  HCT 33.6*  MCV 87.7  PLT 146*    Basic Metabolic Panel:  Lab Results  Component Value Date   NA 139 03/20/2021   K 3.3 (L) 03/20/2021   CO2 28 03/20/2021   GLUCOSE 100 (H) 03/20/2021   BUN 12 03/20/2021   CREATININE 1.02 03/20/2021   CALCIUM 8.9 03/20/2021   GFRNONAA >60 03/20/2021   Lipid Panel:  Lab Results  Component Value Date   LDLCALC 99 03/21/2021    HgbA1c:  Lab Results  Component Value Date   HGBA1C 5.5 03/21/2021   Urine Drug Screen:     Component Value Date/Time   LABOPIA NONE DETECTED 03/20/2021 2015   COCAINSCRNUR NONE DETECTED 03/20/2021 2015   LABBENZ NONE DETECTED 03/20/2021 2015   AMPHETMU NONE DETECTED 03/20/2021 2015   THCU POSITIVE (A) 03/20/2021 2015   LABBARB NONE DETECTED 03/20/2021 2015    Alcohol Level     Component Value  Date/Time   ETH <10 03/20/2021 2015   LDL 99 A1c 5.5   Impression    65 yo man with hx HL presented with L paresthesias and was found to have R thalamic infarct  Recommendations   - Stroke w/u complete - Continue ASA 81mg  daily + plavix 75mg  daily x21 days f/b ASA 81mg  daily after that - Continue atorvastatin 40mg  daily - Ambulatory referral to neurology after discharge (I will place)  Neurology to sign off, please re-engage if any additional questions arise ______________________________________________________________________   Thank you for the opportunity to take part in the care of this patient. If you have any further questions, please contact the neurology consultation attending.  Signed,  Su Monks, MD [facility] [phone]  If 7pm- 7am, please page neurology on call as listed in [place].

## 2021-05-03 ENCOUNTER — Other Ambulatory Visit: Payer: Self-pay | Admitting: Internal Medicine

## 2021-05-03 ENCOUNTER — Other Ambulatory Visit (HOSPITAL_COMMUNITY): Payer: Self-pay | Admitting: Internal Medicine

## 2021-05-03 DIAGNOSIS — R29898 Other symptoms and signs involving the musculoskeletal system: Secondary | ICD-10-CM

## 2021-05-03 DIAGNOSIS — Z8673 Personal history of transient ischemic attack (TIA), and cerebral infarction without residual deficits: Secondary | ICD-10-CM

## 2021-05-05 ENCOUNTER — Ambulatory Visit
Admission: RE | Admit: 2021-05-05 | Discharge: 2021-05-05 | Disposition: A | Discharge status: Home / Self Care | Payer: BC Managed Care – PPO | Source: Ambulatory Visit | Attending: Internal Medicine | Admitting: Internal Medicine

## 2021-05-05 ENCOUNTER — Other Ambulatory Visit: Payer: Self-pay

## 2021-05-05 DIAGNOSIS — Z8673 Personal history of transient ischemic attack (TIA), and cerebral infarction without residual deficits: Secondary | ICD-10-CM | POA: Diagnosis present

## 2021-05-05 DIAGNOSIS — R29898 Other symptoms and signs involving the musculoskeletal system: Secondary | ICD-10-CM | POA: Diagnosis present

## 2021-11-08 ENCOUNTER — Encounter: Admission: RE | Payer: Self-pay | Source: Ambulatory Visit

## 2021-11-08 ENCOUNTER — Ambulatory Visit: Admission: RE | Admit: 2021-11-08 | Payer: Medicare Other | Source: Ambulatory Visit | Admitting: Internal Medicine

## 2021-11-08 SURGERY — COLONOSCOPY
Anesthesia: General

## 2022-10-29 HISTORY — PX: ROTATOR CUFF REPAIR: SHX139

## 2023-01-25 ENCOUNTER — Emergency Department: Payer: Medicare Other

## 2023-01-25 ENCOUNTER — Emergency Department
Admission: EM | Admit: 2023-01-25 | Discharge: 2023-01-25 | Disposition: A | Discharge status: Home / Self Care | Payer: Medicare Other | Attending: Emergency Medicine | Admitting: Emergency Medicine

## 2023-01-25 ENCOUNTER — Encounter: Payer: Self-pay | Admitting: Intensive Care

## 2023-01-25 ENCOUNTER — Other Ambulatory Visit: Payer: Self-pay

## 2023-01-25 DIAGNOSIS — S43015A Anterior dislocation of left humerus, initial encounter: Secondary | ICD-10-CM | POA: Diagnosis not present

## 2023-01-25 DIAGNOSIS — W5512XA Struck by horse, initial encounter: Secondary | ICD-10-CM | POA: Insufficient documentation

## 2023-01-25 DIAGNOSIS — S43005A Unspecified dislocation of left shoulder joint, initial encounter: Secondary | ICD-10-CM

## 2023-01-25 DIAGNOSIS — S4992XA Unspecified injury of left shoulder and upper arm, initial encounter: Secondary | ICD-10-CM | POA: Diagnosis present

## 2023-01-25 HISTORY — DX: Essential (primary) hypertension: I10

## 2023-01-25 LAB — CBC WITH DIFFERENTIAL/PLATELET
Abs Immature Granulocytes: 0.02 10*3/uL (ref 0.00–0.07)
Basophils Absolute: 0 10*3/uL (ref 0.0–0.1)
Basophils Relative: 0 %
Eosinophils Absolute: 0 10*3/uL (ref 0.0–0.5)
Eosinophils Relative: 0 %
HCT: 45.6 % (ref 39.0–52.0)
Hemoglobin: 14.9 g/dL (ref 13.0–17.0)
Immature Granulocytes: 0 %
Lymphocytes Relative: 17 %
Lymphs Abs: 1.8 10*3/uL (ref 0.7–4.0)
MCH: 29.1 pg (ref 26.0–34.0)
MCHC: 32.7 g/dL (ref 30.0–36.0)
MCV: 89.1 fL (ref 80.0–100.0)
Monocytes Absolute: 0.4 10*3/uL (ref 0.1–1.0)
Monocytes Relative: 4 %
Neutro Abs: 8.3 10*3/uL — ABNORMAL HIGH (ref 1.7–7.7)
Neutrophils Relative %: 79 %
Platelets: 179 10*3/uL (ref 150–400)
RBC: 5.12 MIL/uL (ref 4.22–5.81)
RDW: 11.5 % (ref 11.5–15.5)
WBC: 10.5 10*3/uL (ref 4.0–10.5)
nRBC: 0 % (ref 0.0–0.2)

## 2023-01-25 LAB — COMPREHENSIVE METABOLIC PANEL
ALT: 22 U/L (ref 0–44)
AST: 32 U/L (ref 15–41)
Albumin: 4.5 g/dL (ref 3.5–5.0)
Alkaline Phosphatase: 108 U/L (ref 38–126)
Anion gap: 15 (ref 5–15)
BUN: 11 mg/dL (ref 8–23)
CO2: 26 mmol/L (ref 22–32)
Calcium: 9.3 mg/dL (ref 8.9–10.3)
Chloride: 99 mmol/L (ref 98–111)
Creatinine, Ser: 0.88 mg/dL (ref 0.61–1.24)
GFR, Estimated: >60 mL/min (ref >60)
Glucose, Bld: 165 mg/dL — ABNORMAL HIGH (ref 70–99)
Potassium: 3.3 mmol/L — ABNORMAL LOW (ref 3.5–5.1)
Sodium: 140 mmol/L (ref 135–145)
Total Bilirubin: 0.8 mg/dL (ref 0.3–1.2)
Total Protein: 8 g/dL (ref 6.5–8.1)

## 2023-01-25 MED ORDER — NAPROXEN 500 MG PO TABS: 500.0000 mg | ORAL_TABLET | Freq: Two times a day (BID) | ORAL | 0 refills | Status: AC

## 2023-01-25 MED ORDER — OXYCODONE HCL 5 MG PO TABS: 5.0000 mg | ORAL_TABLET | Freq: Four times a day (QID) | ORAL | 0 refills | Status: AC | PRN

## 2023-01-25 MED ORDER — ONDANSETRON HCL 4 MG/2ML IJ SOLN
4.0000 mg | Freq: Once | INTRAMUSCULAR | Status: AC
  Administered 2023-01-25: 4 mg via INTRAVENOUS
  Filled 2023-01-25: qty 2

## 2023-01-25 MED ORDER — HYDROMORPHONE HCL 1 MG/ML IJ SOLN
0.5000 mg | Freq: Once | INTRAMUSCULAR | Status: AC
  Administered 2023-01-25: 0.5 mg via INTRAVENOUS
  Filled 2023-01-25: qty 0.5

## 2023-01-25 MED ORDER — KETAMINE HCL 50 MG/5ML IJ SOSY
40.0000 mg | PREFILLED_SYRINGE | Freq: Once | INTRAMUSCULAR | Status: DC
  Filled 2023-01-25: qty 5

## 2023-01-25 MED ORDER — HYDROMORPHONE HCL 1 MG/ML IJ SOLN
0.5000 mg | INTRAMUSCULAR | Status: DC | PRN
  Administered 2023-01-25 (×2): 0.5 mg via INTRAVENOUS
  Filled 2023-01-25 (×2): qty 0.5

## 2023-01-25 MED ORDER — PROPOFOL 10 MG/ML IV BOLUS
40.0000 mg | Freq: Once | INTRAVENOUS | Status: AC
  Administered 2023-01-25: 70 mg via INTRAVENOUS
  Filled 2023-01-25: qty 20

## 2023-01-25 MED ORDER — ONDANSETRON 4 MG PO TBDP: 4.0000 mg | ORAL_TABLET | Freq: Three times a day (TID) | ORAL | 0 refills | Status: AC | PRN

## 2023-01-25 NOTE — ED Triage Notes (Signed)
First nurse note: Pt reports was kicked in the left shoulder by a horse 1hr PTA.

## 2023-01-25 NOTE — ED Notes (Signed)
Room set up for conscious sedation.

## 2023-01-25 NOTE — Progress Notes (Signed)
Standby for Conscience sedation

## 2023-01-25 NOTE — Discharge Instructions (Addendum)
Please call orthopedics to make a follow-up appointment stay in the sling and to get follow-up and return to the ER if develop worsening symptoms or any other concerns.  Take the Naproxen twice daily for 1 week, with food.  Take the Oxycodone for severe pain.  Take tylenol in addition to the Naproxen for mild to moderate pain.

## 2023-01-25 NOTE — ED Provider Notes (Signed)
.  Ortho Injury Treatment  Date/Time: 01/25/2023 4:59 PM  Performed by: Duffy Bruce, MD Authorized by: Duffy Bruce, MD   Consent:    Consent obtained:  Written   Consent given by:  Patient   Risks discussed:  Fracture, irreducible dislocation, stiffness, restricted joint movement, recurrent dislocation and nerve damage   Alternatives discussed:  Alternative treatmentInjury location: Left arm. Pre-procedure distal perfusion: normal Pre-procedure neurological function: diminished Pre-procedure range of motion: reduced  Patient sedated: Yes. Refer to sedation procedure documentation for details of sedation. Immobilization: sling Splint Applied by: ED Nurse and ED Provider Post-procedure distal perfusion: normal Post-procedure neurological function: diminished Post-procedure range of motion: improved    Shoulder reduced with Dr. Nickolas Madrid performing sedation and myself performing reduction.  Patient tolerated the procedure well.  His pain is significantly improved.  He does continue to have some paresthesias and subjective weakness in his hand and wrist.  Suspect possible neuropraxia in the setting of his dislocation.  He has fully intact radial pulse and cap refill after the reduction and symptoms seem to be mildly improving.  The case was discussed with Dr. Roland Rack of orthopedics.  Will have him follow-up as an outpatient and start anti-inflammatories for now.  No emergent indication for surgery at this time.  Return precautions given.    Duffy Bruce, MD 01/25/23 [phone]

## 2023-01-25 NOTE — ED Triage Notes (Signed)
Patient presents with left shoulder injury from horse. Denies hitting head. Denies LOC. Patient currently vomiting in triage. Reports he cannot feel his left hand fingers

## 2023-01-25 NOTE — ED Notes (Signed)
MD Issacs at bedside to attempt to manipulate shoulder back in.

## 2023-01-25 NOTE — ED Notes (Signed)
Rn to bedside to introduce self to pt. MD at bedside trying to manually put shoulder back in place.

## 2023-01-25 NOTE — ED Provider Notes (Signed)
[facility] Provider Note    Event Date/Time   First MD Initiated Contact with Patient 01/25/23 1344     (approximate)   History   Shoulder Injury   HPI  Jimmy Montoya is a 67 y.o. male who comes in with a left shoulder injury, reports being kicked in the shoulder by a horse 1 hour prior to arrival.  Patient reports that he was with his horse when a truck down from him and pulled his left arm he did not fall did not hit his head.  He reports pain in his left shoulder.  He reports the pain goes down his arm he is got numbness, tingling down the arm    Physical Exam   Triage Vital Signs: ED Triage Vitals  Enc Vitals Group     BP 01/25/23 1343 139/78     Pulse Rate 01/25/23 1343 85     Resp 01/25/23 1343 16     Temp 01/25/23 1343 97.8 F (36.6 C)     Temp Source 01/25/23 1343 Oral     SpO2 01/25/23 1343 96 %     Weight 01/25/23 1340 172 lb (78 kg)     Height 01/25/23 1340 5\' 8"  (1.727 m)     Head Circumference --      Peak Flow --      Pain Score 01/25/23 1340 10     Pain Loc --      Pain Edu? --      Excl. in [place]? --     Most recent vital signs: Vitals:   01/25/23 1343  BP: 139/78  Pulse: 85  Resp: 16  Temp: 97.8 F (36.6 C)  SpO2: 96%     General: Awake, no distress.  CV:  Good peripheral perfusion.  Resp:  Normal effort.  Abd:  No distention.  Other:  Patient is deformity noted to the left shoulder.  Good distal pulse.  He is able to move his wrist up and down but he reports numbness, tingling of the whole hand.  Able to move his fingers.   ED Results / Procedures / Treatments   Labs (all labs ordered are listed, but only abnormal results are displayed) Labs Reviewed  CBC WITH DIFFERENTIAL/PLATELET - Abnormal; Notable for the following components:      Result Value   Neutro Abs 8.3 (*)    All other components within normal limits  COMPREHENSIVE METABOLIC PANEL      RADIOLOGY I have reviewed the xray personally and  interpreted and patient has a dislocation  PROCEDURES:  Critical Care performed: No  Reduction of dislocation  Date/Time: 01/25/2023 4:08 PM  Performed by: Vanessa [place], MD Authorized by: Vanessa [place], MD  Consent: Written consent obtained. Consent given by: patient Patient identity confirmed: verbally with patient  Sedation: Patient sedated: yes Sedatives: propofol Sedation start date/time: 01/25/2023 4:00 PM Sedation end date/time: 01/25/2023 4:10 PM  Comments: Did have some decreased respiratory rates requiring some bagging briefly with oxygen levels that were coming up      [place] [facility]: Medications  HYDROmorphone (DILAUDID) injection 0.5 mg (0.5 mg Intravenous Given 01/25/23 1533)  ketamine 50 mg in normal saline 5 mL (10 mg/mL) syringe (has no administration in time range)  HYDROmorphone (DILAUDID) injection 0.5 mg (0.5 mg Intravenous Given 01/25/23 1438)  ondansetron (ZOFRAN) injection 4 mg (4 mg Intravenous Given 01/25/23 1439)  HYDROmorphone (DILAUDID) injection 0.5 mg (0.5 mg Intravenous Given 01/25/23 1530)  propofol (  DIPRIVAN) 10 mg/mL bolus/IV push 40 mg (70 mg Intravenous Given 01/25/23 1601)  ondansetron (ZOFRAN) injection 4 mg (4 mg Intravenous Given 01/25/23 1558)     IMPRESSION / MDM / ASSESSMENT AND PLAN / ED COURSE  I reviewed the triage vital signs and the nursing notes.   Patient's presentation is most consistent with acute presentation with potential threat to life or bodily function.   Patient given some IV Dilaudid.  X-ray ordered evaluate for any fracture versus dislocation does confirm dislocation.  Unable to reduce without sedation.  Patient again was given some IV Dilaudid.  Attempted reduction without sedation with IV Dilaudid unable to reduce.  Patient sedated with propofol.  Did require some minimal bagging but tolerated well overall was able to pop in place.  Repeat x-ray repeat neuroexam pending patient waking up.  Patient  handed off pending this evaluation with Dr. Ellender Hose   The patient is on the cardiac monitor to evaluate for evidence of arrhythmia and/or significant heart rate changes.      FINAL CLINICAL IMPRESSION(S) / ED DIAGNOSES   Final diagnoses:  Shoulder dislocation, left, initial encounter     Rx / DC Orders   ED Discharge Orders     None        Note:  This document was prepared using Dragon voice recognition software and may include unintentional dictation errors.   Vanessa [place], MD 01/25/23 [phone]

## 2023-02-05 ENCOUNTER — Other Ambulatory Visit: Payer: Self-pay | Admitting: Surgery

## 2023-02-05 DIAGNOSIS — S43005A Unspecified dislocation of left shoulder joint, initial encounter: Secondary | ICD-10-CM

## 2023-02-06 ENCOUNTER — Ambulatory Visit
Admission: RE | Admit: 2023-02-06 | Discharge: 2023-02-06 | Disposition: A | Discharge status: Home / Self Care | Payer: Medicare Other | Source: Ambulatory Visit | Attending: Surgery | Admitting: Surgery

## 2023-02-06 DIAGNOSIS — S43005A Unspecified dislocation of left shoulder joint, initial encounter: Secondary | ICD-10-CM

## 2023-02-07 ENCOUNTER — Encounter: Payer: Self-pay | Admitting: Occupational Therapy

## 2023-02-07 ENCOUNTER — Ambulatory Visit: Payer: Medicare Other | Attending: Surgery | Admitting: Occupational Therapy

## 2023-02-07 DIAGNOSIS — M6281 Muscle weakness (generalized): Secondary | ICD-10-CM | POA: Diagnosis present

## 2023-02-07 DIAGNOSIS — T148XXA Other injury of unspecified body region, initial encounter: Secondary | ICD-10-CM | POA: Insufficient documentation

## 2023-02-07 DIAGNOSIS — M25632 Stiffness of left wrist, not elsewhere classified: Secondary | ICD-10-CM | POA: Diagnosis present

## 2023-02-07 DIAGNOSIS — M25622 Stiffness of left elbow, not elsewhere classified: Secondary | ICD-10-CM | POA: Diagnosis present

## 2023-02-07 DIAGNOSIS — M25642 Stiffness of left hand, not elsewhere classified: Secondary | ICD-10-CM | POA: Diagnosis present

## 2023-02-07 DIAGNOSIS — M79602 Pain in left arm: Secondary | ICD-10-CM | POA: Insufficient documentation

## 2023-02-07 NOTE — Therapy (Signed)
[facility] [address] [place], [place], [id number] Phone: [phone]   Fax:  [phone]  Occupational Therapy Evaluation  Patient Details  Name: Jimmy Montoya MRN: [id number] Date of Birth: 1956/08/11 Referring Provider (OT): Dr Joice LoftsPoggi   Encounter Date: 02/07/2023   OT End of Session - 02/07/23 1923     Visit Number 1    Number of Visits 16    Date for OT Re-Evaluation 04/04/23    OT Start Time 1025    OT Stop Time 1120    OT Time Calculation (min) 55 min    Activity Tolerance Patient tolerated treatment well    Behavior [facility] for tasks assessed/performed             Past Medical History:  Diagnosis Date   Anemia 03/20/2021   Flu 09/29/2015   Hernia, inguinal, right 09/02/2015   Hyperlipemia    Hypertension    Kidney stone     Past Surgical History:  Procedure Laterality Date   HERNIA REPAIR     INGUINAL HERNIA REPAIR Right 12/23/2015   Procedure: HERNIA REPAIR INGUINAL ADULT;  Surgeon: Nadeen LandauJarvis Wilton Smith, MD;  Location: [facility] ORS;  Service: General;  Laterality: Right;    There were no vitals filed for this visit.   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 male who presents for evaluation and treatment of a left shoulder injury which occurred 1 week ago. Apparently he was leaving a horse while holding onto the rains when the horse suddenly reared causing his shoulder to dislocate. He presented to the emergency room where the shoulder was reduced under conscious sedation. The patient notes that his shoulder is feeling reasonably comfortable but he has a lot of pain in the upper arm extending to the elbow region. He also  notes numbness and tingling as well as pain extending from his elbow down to his wrist. He has been wearing his shoulder immobilizer on a regular basis, removing it for bathing purposes. He rates his pain at 8/10 on today's visit and has been taking naproxen and oxycodone as necessary with temporary partial relief of his symptoms. He is right-hand dominant. He denies any prior dislocation or other injury to the left shoulder in the past.The patient is quite concerned by the neurologic deficits he continues to experience after his acute shoulder dislocation. It does appear that he has some deficits, primarily involving the radial and ulnar nerve distributions, although he also appears to have some deficits with the musculocutaneous and median nerves as well. Therefore, I have recommended that he undergo an EMG/NCV study of the left upper extremity to assess these nerves for evidence of damage and reinnervation. In addition, he will be started in occupational therapy to work on range of motion and strength exercises to the left upper extremity. Finally, he will be sent for an MRI scan of the left shoulder to evaluate for rotator cuff tear. Meanwhile, the patient will continue to wear his shoulder immobilizer on a regular basis, removing it for bathing purposes and for exercises. He may continue his present medication regimen, but is  encouraged to wean off of his oxycodone as soon as possible.    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    Currently in Pain? Yes    Pain Score 7     Pain Location --   arm elbow to hand   Pain Orientation Left    Pain Descriptors / Indicators Aching;Tightness;Tingling;Burning;Numbness;Pins and needles    Pain Type Acute pain    Pain Onset 1 to 4 weeks ago    Pain Frequency Constant               [facility] OT Assessment - 02/07/23 0001       Assessment   Referring Provider (OT) Dr Joice Lofts     Onset Date/Surgical Date 01/25/23    Hand Dominance Right    Next MD Visit middle May      Precautions   Precaution Comments Keep shoulder at side   Sling when up and about -     Home  Environment   Lives With Spouse      Prior Function   Vocation Self employed    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, farm with cows and horses      AROM   Left Elbow Flexion --   2/5 -place and hold palm up and thumb up   Left Elbow Extension --   2/5   Left Forearm Pronation 90 Degrees    Left Forearm Supination 75 Degrees    Left Wrist Extension --   3/5- partial place and hold into RD   Left Wrist Flexion --   3+/5   Left Wrist Radial Deviation --   4-/5   Left Wrist Ulnar Deviation --   3/5     Left Hand AROM   L Thumb Radial ADduction/ABduction 0-55 35   pain with PROM to 50   L Thumb Palmar ADduction/ABduction 0-45 35   PROM pain to 50   L Thumb Opposition to Index --   unable   L Index  MCP 0-90 60 Degrees   -40   L Index PIP 0-100 60 Degrees   -30   L Long  MCP 0-90 70 Degrees   -30   L Long PIP 0-100 100 Degrees   -30   L Ring  MCP 0-90 70 Degrees   -30   L Ring PIP 0-100 100 Degrees   -30   L Little  MCP 0-90 70 Degrees   -30   L Little PIP 0-100 100 Degrees   -30                Home exercise reviewed with patient and wife.  Adjusted sling not to wear strap around the waist pulling with wrist in flexion and pressure over dorsal wrist and hand increasing edema. Patient to continue wearing sling as per MD order Contrast to left hand forearm and elbow 3 times a day prior to home exercises Unable to fit Isotoner glove with Tubigrip D from digits to elbow patient to wear most all the time. Placed on hold elbow flexion and extension in sitting 5-8 reps in neutral and supination position Active range of motion supination pronation left arm in lap 10 reps Placing hold wrist extension over bilateral 8 reps AROM of radial ulnar deviation over pillow 10 reps Active assisted range of motion  for composite flexion of all digits with active assisted range of motion  full extension 10 reps Webspace massage as well as metacarpal spreads by wife prior to Thumb palmar radial abduction active assisted range of motion for endrange 10 reps each              OT Education - 02/07/23 1619     Education Details Findings of evaluation and home program reviewed    Person(s) Educated Patient;Spouse    Methods Explanation;Demonstration;Tactile cues;Verbal cues;Handout    Comprehension Verbal cues required;Returned demonstration;Verbalized understanding                 OT Long Term Goals - 02/07/23 1937       OT LONG TERM GOAL #1   Title Patient wife independent in home program to decrease edema and pain less 3-4/10 at rest show increased elbow, wrist and digit active into motion    Baseline Pain 6-9/10 from elbow down to digits as well as increased edema from elbow down to hand; decreased range of motion in elbow, wrist, forearm as well as to chest    Time 3    Period Weeks    Status New    Target Date 02/28/23      OT LONG TERM GOAL #2   Title L Elbow flexion and extension increased to within normal limits for patient able to initiate washing face and put hand through sleeve if    Baseline Elbow flexion and extention 2/5 - place and hold partial flexion - dropping hand cannot tolerate resistance for ext- pain 6-9/10 - unable to flex in pronation    Time 4    Period Weeks    Status New    Target Date 03/07/23      OT LONG TERM GOAL #3   Title L wrist  and forearm ROM and strength increase to 4-/5 to turn doorknob and push hand thru sleeve    Baseline Wrist in all planes decrease - pain  - and 3 to 4-/5 - in all planes- unable to use hand or wrist    Time 5    Period Weeks    Status New    Target Date 03/14/23      OT LONG TERM GOAL #4   Title L hand digits increase for pt to touch palm and digits extention WNL to donn gloves    Baseline decrease digits flexion  MC's 60-70 and 2nd PIP 60; extnetion -30 to -40 MC's nad -30 for PIP    Time 5    Period Weeks    Status New    Target Date 03/14/23      OT LONG TERM GOAL #5   Title L thumb AROM increase to WNL for pt to hold cup and turn doorknob    Baseline L thumb flexion decrease greatly -and edema , pain - PA and RA 35 - tight in webspace    Time 6    Period Weeks    Status New    Target Date 03/21/23                   Plan - 02/07/23 1924     Clinical Impression Statement Pt refer to OT with diagnosis of L shoulder anterior dislocation on 01/24/23. Apparently he was leading a horse while holding onto the rains when the horse suddenly reared causing his shoulder to dislocate. Pt was seen in ED where shoulder was reduced under conscious sedation. Pt present with L UE in sling removing it for bathing purposes. He rates his pain  at 6-9/10 from elbow down to hand. Pt is R hand dominant. Pt show nerve pain in L UE from elbow down to digits -with increase edema and decrease AROM in all planes for elbow, wrist , digits including thumb. Pt unable to use L UE in any ADL's or IADL's - increase edema, pain and decrease ROM and strength - Per pt and wife plan is for pt to have EMG/NCV study of the left upper extremity  in near future and had yesterday MRI to shoulder. Pt can benefit from skilled OT services to decrease edema, pain and increase AROM and strength in L UE to be able to use L hand in functional tasks- pt had CVA in past but had full motion and strength with diminshed sensation in thumb and 2nd digit    OT Occupational Profile and History Problem Focused Assessment - Including review of records relating to presenting problem    Occupational performance deficits (Please refer to evaluation for details): ADL's;IADL's;Rest and Sleep;Work;Play;Social Participation;Leisure    Body Structure / Function / Physical Skills ADL;Edema;Dexterity;Decreased knowledge of use of DME;Decreased knowledge of  precautions;Flexibility;ROM;UE functional use;FMC;Sensation;Pain;Strength;IADL;Proprioception;Coordination    Rehab Potential Fair    Clinical Decision Making Several treatment options, min-mod task modification necessary    Comorbidities Affecting Occupational Performance: May have comorbidities impacting occupational performance   CVA in past   Modification or Assistance to Complete Evaluation  Min-Moderate modification of tasks or assist with assess necessary to complete eval    OT Frequency 2x / week    OT Duration 8 weeks    OT Treatment/Interventions Self-care/ADL training;Moist Heat;Paraffin;Fluidtherapy;Contrast Bath;DME and/or AE instruction;Therapeutic exercise;Splinting;Patient/family education;Therapeutic activities;Manual Therapy;Passive range of motion    Consulted and Agree with Plan of Care Patient;Family member/caregiver             Patient will benefit from skilled therapeutic intervention in order to improve the following deficits and impairments:   Body Structure / Function / Physical Skills: ADL, Edema, Dexterity, Decreased knowledge of use of DME, Decreased knowledge of precautions, Flexibility, ROM, UE functional use, FMC, Sensation, Pain, Strength, IADL, Proprioception, Coordination       Visit Diagnosis: Nerve injury  Pain in left arm  Stiffness of left elbow, not elsewhere classified  Stiffness of left wrist, not elsewhere classified  Stiffness of left hand, not elsewhere classified  Muscle weakness (generalized)    Problem List Patient Active Problem List   Diagnosis Date Noted   Acute CVA (cerebrovascular accident) 03/20/2021   HTN (hypertension) 03/20/2021   HLD (hyperlipidemia) 03/20/2021   Anemia 03/20/2021   Hypokalemia 03/20/2021    Jimmy Montoya, OTR/L,CLT 02/07/2023, 7:49 PM  [facility] [address] [place], [address] Phone: [phone]   Fax:   [phone]  Name: Nesbitt Gandia MRN: [id number] Date of Birth: 20-Oct-1956

## 2023-02-12 ENCOUNTER — Ambulatory Visit: Payer: Medicare Other | Admitting: Occupational Therapy

## 2023-02-12 DIAGNOSIS — M6281 Muscle weakness (generalized): Secondary | ICD-10-CM

## 2023-02-12 DIAGNOSIS — T148XXA Other injury of unspecified body region, initial encounter: Secondary | ICD-10-CM

## 2023-02-12 DIAGNOSIS — M25632 Stiffness of left wrist, not elsewhere classified: Secondary | ICD-10-CM

## 2023-02-12 DIAGNOSIS — M25622 Stiffness of left elbow, not elsewhere classified: Secondary | ICD-10-CM

## 2023-02-12 DIAGNOSIS — M25642 Stiffness of left hand, not elsewhere classified: Secondary | ICD-10-CM

## 2023-02-12 DIAGNOSIS — M79602 Pain in left arm: Secondary | ICD-10-CM

## 2023-02-12 NOTE — Therapy (Signed)
[facility] [address], [place], [address] Phone: [phone]   Fax:  [phone]  Occupational Therapy Treatment  Patient Details  Name: Jimmy Montoya MRN: [id number] Date of Birth: Aug 20, 1956 Referring Provider (OT): Dr Joice Lofts   Encounter Date: 02/12/2023   OT End of Session - 02/12/23 1722     Visit Number 2    Number of Visits 16    Date for OT Re-Evaluation 04/04/23    OT Start Time 1615    OT Stop Time 1710    OT Time Calculation (min) 55 min    Activity Tolerance Patient tolerated treatment well    Behavior During Therapy [facility] for tasks assessed/performed             Past Medical History:  Diagnosis Date   Anemia 03/20/2021   Flu 09/29/2015   Hernia, inguinal, right 09/02/2015   Hyperlipemia    Hypertension    Kidney stone     Past Surgical History:  Procedure Laterality Date   HERNIA REPAIR     INGUINAL HERNIA REPAIR Right 12/23/2015   Procedure: HERNIA REPAIR INGUINAL ADULT;  Surgeon: Nadeen Landau, MD;  Location: [facility] ORS;  Service: General;  Laterality: Right;    There were no vitals filed for this visit.   Subjective Assessment - 02/12/23 1720     Subjective  Since yesterday I felt this pinch pain  from my neck and then runs down my arm. keeping sling on most of time- at workshop from 11am to 8 pm - pins and needles still in hand and fingers numb- MRI results in my chart but not seeing Dr Joice Lofts until in May    Pertinent History 02/01/23 Ortho note with DR Poggi- Jimmy Montoya is a 67 y.o. male who presents for evaluation and treatment of a left shoulder injury which occurred 1 week ago. Apparently he was leaving a horse while holding onto the rains when the horse suddenly reared causing his shoulder to dislocate. He presented to the emergency room where the shoulder was reduced under conscious sedation. The patient notes that his shoulder is feeling reasonably comfortable but he has a lot of  pain in the upper arm extending to the elbow region. He also notes numbness and tingling as well as pain extending from his elbow down to his wrist. He has been wearing his shoulder immobilizer on a regular basis, removing it for bathing purposes. He rates his pain at 8/10 on today's visit and has been taking naproxen and oxycodone as necessary with temporary partial relief of his symptoms. He is right-hand dominant. He denies any prior dislocation or other injury to the left shoulder in the past.The patient is quite concerned by the neurologic deficits he continues to experience after his acute shoulder dislocation. It does appear that he has some deficits, primarily involving the radial and ulnar nerve distributions, although he also appears to have some deficits with the musculocutaneous and median nerves as well. Therefore, I have recommended that he undergo an EMG/NCV study of the left upper extremity to assess these nerves for evidence of damage and reinnervation. In addition, he will be started in occupational therapy to work on range of motion and strength exercises to the left upper extremity. Finally, he will be sent for an MRI scan of the left shoulder to evaluate for rotator cuff tear. Meanwhile, the patient will continue to wear his shoulder immobilizer on a regular basis, removing it for bathing purposes  and for exercises. He may continue his present medication regimen, but is encouraged to wean off of his oxycodone as soon as possible.    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    Currently in Pain? Yes    Pain Score 5     Pain Location --   hand   Pain Orientation Left    Pain Descriptors / Indicators Aching;Tightness;Pins and needles;Tingling    Pain Type Acute pain    Pain Onset 1 to 4 weeks ago    Pain Frequency Constant                [facility] OT Assessment - 02/12/23 0001       AROM   Left Elbow  Flexion 110   AAROM   Left Elbow Extension 0   AAROM   Left Forearm Pronation 90 Degrees    Left Forearm Supination 80 Degrees    Left Wrist Extension --   4-/5 into RD   Left Wrist Flexion --   4-/5   Left Wrist Radial Deviation --   4-/5   Left Wrist Ulnar Deviation 0.6 Degrees      Left Hand AROM   L Thumb Radial ADduction/ABduction 0-55 42    L Thumb Palmar ADduction/ABduction 0-45 50    L Index  MCP 0-90 70 Degrees    L Index PIP 0-100 70 Degrees   0 ext   L Long  MCP 0-90 75 Degrees    L Long PIP 0-100 100 Degrees   -20 ext   L Ring  MCP 0-90 80 Degrees    L Ring PIP 0-100 100 Degrees   0 ext   L Little  MCP 0-90 80 Degrees   0 ext   L Little PIP 0-100 100 Degrees             Patient wife arrived with left upper extremity in sling. Had questions about MRI and sling wearing. Advised the patient and wife to call Dr. Joice Lofts he tomorrow about MRI results as well as sling wearing and if physical therapy is recommended may be for shoulder.  Patient report having a stinging pain at times since yesterday from neck down to arm. Reviewed with patient wearing of sling pulling on the neck as well as getting out of the sling when sitting and positioning hand outcome pillow. Patient is standing reviewed with patient scapular squeezes as well as cervical retraction. 4 times a day 5-8 reps.    Reassessment and Home exercise reviewed with patient and wife.  Last time adjusted sling not to wear strap around the waist pulling with wrist in flexion and pressure over dorsal wrist and hand increasing edema. Patient to continue wearing sling as per MD order  Contrast to left hand forearm and elbow 2-3 times a day prior to home exercises Also recommended home exercises while at the workshop to get out of the sling and work on home exercises especially hand. Able to fit Isotoner glove today patient to wear to tolerance as well as Tubigrip D if needed to switch.  Active assisted range of  motion in standing for elbow flexion to about 110 degrees to full extension in standing 10 eps in neutral and supination position Active range of motion supination pronation left arm on pillow 10 reps Placing hold wrist extension over pillow 8 reps AROM of radial ulnar  deviation over pillow 10 reps Active assisted range of motion for composite flexion of all digits with active assisted range of motion full extension 10 reps Webspace massage as well as metacarpal spreads by wife prior to Thumb palmar radial abduction active assisted range of motion for endrange 10 reps each   Patient unable to do composite MC extension or tapping of digits of table. Also decrease wrist extension with ulnar deviation.               OT Education - 02/12/23 1722     Education Details progress and changes to HEP    Person(s) Educated Patient;Spouse    Methods Explanation;Demonstration;Tactile cues;Verbal cues;Handout    Comprehension Verbal cues required;Returned demonstration;Verbalized understanding                 OT Long Term Goals - 02/07/23 1937       OT LONG TERM GOAL #1   Title Patient wife independent in home program to decrease edema and pain less 3-4/10 at rest show increased elbow, wrist and digit active into motion    Baseline Pain 6-9/10 from elbow down to digits as well as increased edema from elbow down to hand; decreased range of motion in elbow, wrist, forearm as well as to chest    Time 3    Period Weeks    Status New    Target Date 02/28/23      OT LONG TERM GOAL #2   Title L Elbow flexion and extension increased to within normal limits for patient able to initiate washing face and put hand through sleeve if    Baseline Elbow flexion and extention 2/5 - place and hold partial flexion - dropping hand cannot tolerate resistance for ext- pain 6-9/10 - unable to flex in pronation    Time 4    Period Weeks    Status New    Target Date 03/07/23      OT LONG TERM GOAL  #3   Title L wrist  and forearm ROM and strength increase to 4-/5 to turn doorknob and push hand thru sleeve    Baseline Wrist in all planes decrease - pain  - and 3 to 4-/5 - in all planes- unable to use hand or wrist    Time 5    Period Weeks    Status New    Target Date 03/14/23      OT LONG TERM GOAL #4   Title L hand digits increase for pt to touch palm and digits extention WNL to donn gloves    Baseline decrease digits flexion MC's 60-70 and 2nd PIP 60; extnetion -30 to -40 MC's nad -30 for PIP    Time 5    Period Weeks    Status New    Target Date 03/14/23      OT LONG TERM GOAL #5   Title L thumb AROM increase to WNL for pt to hold cup and turn doorknob    Baseline L thumb flexion decrease greatly -and edema , pain - PA and RA 35 - tight in webspace    Time 6    Period Weeks    Status New    Target Date 03/21/23                   Plan - 02/12/23 1723     Clinical Impression Statement Pt refer to OT with diagnosis of L shoulder anterior dislocation on 01/24/23. Apparently he was leading a horse  while holding onto the rains when the horse suddenly reared causing his shoulder to dislocate. Pt was seen in ED where shoulder was reduced under conscious sedation. Pt present with L UE in sling removing it for bathing purposes. He rates his pain at 6-9/10 from elbow down to hand. Pt is R hand dominant.  At eval pt had nerve pain in L UE from elbow down to digits -with increase edema and decrease AROM in all planes for elbow, wrist , digits including thumb. Pt unable to use L UE in any ADL's or IADL's - increase edema, pain and decrease ROM and strength - Per pt and wife plan is for pt to have EMG/NCV study of the left upper extremity. Pt had MRI - told pt and wife to call Dr Joice Lofts for results and plan fro shoulder .Pt show decrease pain in elbow with increase AROM in elbow and sup/pro; increase wrist ext into RD. Was able to fit isotoner glove on L hand today and pt to get out of  sling and do AROM and AAROM for elbow to digits 4 x day.  Pt can benefit from skilled OT services to decrease edema, pain and increase AROM and strength in L UE to be able to use L hand in functional tasks- pt had CVA in past but had full motion and strength with diminshed sensation in thumb and 2nd digit    OT Occupational Profile and History Problem Focused Assessment - Including review of records relating to presenting problem    Occupational performance deficits (Please refer to evaluation for details): ADL's;IADL's;Rest and Sleep;Work;Play;Social Participation;Leisure    Body Structure / Function / Physical Skills ADL;Edema;Dexterity;Decreased knowledge of use of DME;Decreased knowledge of precautions;Flexibility;ROM;UE functional use;FMC;Sensation;Pain;Strength;IADL;Proprioception;Coordination    Rehab Potential Fair    Clinical Decision Making Several treatment options, min-mod task modification necessary    Comorbidities Affecting Occupational Performance: May have comorbidities impacting occupational performance    Modification or Assistance to Complete Evaluation  Min-Moderate modification of tasks or assist with assess necessary to complete eval    OT Frequency 2x / week    OT Duration 8 weeks    OT Treatment/Interventions Self-care/ADL training;Moist Heat;Paraffin;Fluidtherapy;Contrast Bath;DME and/or AE instruction;Therapeutic exercise;Splinting;Patient/family education;Therapeutic activities;Manual Therapy;Passive range of motion    Consulted and Agree with Plan of Care Patient;Family member/caregiver             Patient will benefit from skilled therapeutic intervention in order to improve the following deficits and impairments:   Body Structure / Function / Physical Skills: ADL, Edema, Dexterity, Decreased knowledge of use of DME, Decreased knowledge of precautions, Flexibility, ROM, UE functional use, FMC, Sensation, Pain, Strength, IADL, Proprioception, Coordination        Visit Diagnosis: Nerve injury  Pain in left arm  Stiffness of left elbow, not elsewhere classified  Stiffness of left wrist, not elsewhere classified  Stiffness of left hand, not elsewhere classified  Muscle weakness (generalized)    Problem List Patient Active Problem List   Diagnosis Date Noted   Acute CVA (cerebrovascular accident) 03/20/2021   HTN (hypertension) 03/20/2021   HLD (hyperlipidemia) 03/20/2021   Anemia 03/20/2021   Hypokalemia 03/20/2021    Oletta Cohn, OTR/L,CLT 02/12/2023, 5:29 PM  [facility] [address] [place], [address] Phone: [phone]   Fax:  [phone]  Name: Jimmy Montoya MRN: [id number] Date of Birth: [birth year]

## 2023-02-14 ENCOUNTER — Ambulatory Visit: Payer: Medicare Other | Admitting: Occupational Therapy

## 2023-02-14 ENCOUNTER — Encounter: Payer: Self-pay | Admitting: Occupational Therapy

## 2023-02-14 DIAGNOSIS — M25642 Stiffness of left hand, not elsewhere classified: Secondary | ICD-10-CM

## 2023-02-14 DIAGNOSIS — M79602 Pain in left arm: Secondary | ICD-10-CM

## 2023-02-14 DIAGNOSIS — T148XXA Other injury of unspecified body region, initial encounter: Secondary | ICD-10-CM | POA: Diagnosis not present

## 2023-02-14 DIAGNOSIS — M25632 Stiffness of left wrist, not elsewhere classified: Secondary | ICD-10-CM

## 2023-02-14 DIAGNOSIS — M25622 Stiffness of left elbow, not elsewhere classified: Secondary | ICD-10-CM

## 2023-02-14 DIAGNOSIS — M6281 Muscle weakness (generalized): Secondary | ICD-10-CM

## 2023-02-14 NOTE — Therapy (Signed)
[facility] [address], [place], [address] Phone: [phone]   Fax:  [phone]  Occupational Therapy Treatment  Patient Details  Name: Jimmy Montoya MRN: [id number] Date of Birth: [birth year] Referring Provider (OT): Dr Joice Lofts   Encounter Date: 02/14/2023   OT End of Session - 02/14/23 1907     Visit Number 3    Number of Visits 16    Date for OT Re-Evaluation 04/04/23    OT Start Time 0819    OT Stop Time 0900    OT Time Calculation (min) 41 min    Activity Tolerance Patient tolerated treatment well    Behavior During Therapy [facility] for tasks assessed/performed             Past Medical History:  Diagnosis Date   Anemia 03/20/2021   Flu 09/29/2015   Hernia, inguinal, right 09/02/2015   Hyperlipemia    Hypertension    Kidney stone     Past Surgical History:  Procedure Laterality Date   HERNIA REPAIR     INGUINAL HERNIA REPAIR Right 12/23/2015   Procedure: HERNIA REPAIR INGUINAL ADULT;  Surgeon: Nadeen Landau, MD;  Location: [facility] ORS;  Service: General;  Laterality: Right;    There were no vitals filed for this visit.  Patient arrived with left upper extremity in sling. Talked with Dr Joice Lofts on phone while patient was there.  MRI showed small rotator cuff tear.  His office will be in contact about surgery date set up.  And a preop visit prior. Sling to be wear for comfort.  Can remove it at times.  Can also sleep without it. Can also start shoulder active range of motion.   Patient to remove sling maybe 30 minutes to hour at a time with 2 hours on in between if no increased pain-4 times a day performing in standing or supine elbow flexion with thumb up and palm up.   Scapular squeezes to continue with 10 reps.   In supine done gentle shoulder abduction on mat 10 reps pain-free  Standing doing pendulum.  Patient to relax shoulder into flexion and do gentle pendulum pain-free.   3-4 times a day.    Patient had a hard time initially to relax.   Also removed sling and had patient just walk swinging on likely to side.   Done for 2 minutes relaxed more.      Reassessment and Home exercise reviewed with patient .  Patient to continue wearing sling as per MD order   Contrast to continue with at home to left hand forearm and elbow 2-3 times a day prior to home exercises Also recommended home exercises while at the workshop to get out of the sling and work on home exercises especially hand. Continue with Isotoner glove today patient to wear to tolerance as well as Tubigrip D if needed to switch.   Active assisted range of motion in standing and supine today for elbow flexion t extension and flexion 10 eps in neutral and supination position-needed active assisted range of motion for initiating and endrange flexion. Active range of motion supination pronation left arm on pillow 10 reps-great progress Placing hold wrist extension over pillow 8 reps ; wrist extension also sideways was able to tolerate resistance to flexion and extension of wrist.  AROM of radial ulnar deviation over pillow and sideways without gravity.  On global.  10 reps Active assisted range of motion for composite flexion of all digits  with active assisted range of motion full extension 10 reps Patient showed increased MC extension this date as well as PIP. Webspace massage as well as metacarpal spreads by wife prior to Thumb palmar radial abduction active assisted range of motion for endrange 10 reps each                     OT Education - 02/14/23 1906     Education Details progress and changes to HEP    Person(s) Educated Patient    Methods Explanation;Demonstration;Tactile cues;Verbal cues;Handout    Comprehension Verbal cues required;Returned demonstration;Verbalized understanding                 OT Long Term Goals - 02/07/23 1937       OT LONG TERM GOAL #1   Title Patient wife  independent in home program to decrease edema and pain less 3-4/10 at rest show increased elbow, wrist and digit active into motion    Baseline Pain 6-9/10 from elbow down to digits as well as increased edema from elbow down to hand; decreased range of motion in elbow, wrist, forearm as well as to chest    Time 3    Period Weeks    Status New    Target Date 02/28/23      OT LONG TERM GOAL #2   Title L Elbow flexion and extension increased to within normal limits for patient able to initiate washing face and put hand through sleeve if    Baseline Elbow flexion and extention 2/5 - place and hold partial flexion - dropping hand cannot tolerate resistance for ext- pain 6-9/10 - unable to flex in pronation    Time 4    Period Weeks    Status New    Target Date 03/07/23      OT LONG TERM GOAL #3   Title L wrist  and forearm ROM and strength increase to 4-/5 to turn doorknob and push hand thru sleeve    Baseline Wrist in all planes decrease - pain  - and 3 to 4-/5 - in all planes- unable to use hand or wrist    Time 5    Period Weeks    Status New    Target Date 03/14/23      OT LONG TERM GOAL #4   Title L hand digits increase for pt to touch palm and digits extention WNL to donn gloves    Baseline decrease digits flexion MC's 60-70 and 2nd PIP 60; extnetion -30 to -40 MC's nad -30 for PIP    Time 5    Period Weeks    Status New    Target Date 03/14/23      OT LONG TERM GOAL #5   Title L thumb AROM increase to WNL for pt to hold cup and turn doorknob    Baseline L thumb flexion decrease greatly -and edema , pain - PA and RA 35 - tight in webspace    Time 6    Period Weeks    Status New    Target Date 03/21/23                   Plan - 02/14/23 1907     Clinical Impression Statement Pt refer to OT with diagnosis of L shoulder anterior dislocation on 01/24/23. Pt is R hand dominant. Apparently he was leading a horse while holding onto the rains when the horse suddenly reared  causing his shoulder to dislocate.  Pt was seen in ED where shoulder was reduced under conscious sedation. Pt present at eval with L UE in sling removing it for bathing purposes - rates his pain at 6-9/10 from elbow down to hand-nerve pain in L UE from elbow down to digits -with increase edema and decrease AROM in all planes for elbow, wrist , digits including thumb. Pt unable to use L UE in any ADL's or IADL's - increase edema, pain and decrease ROM and strength .Pt making slow but steady progress in pain, edema as well as increase AROM in elbow , wrist and hand. Pt arrive today and OT talked to Dr Joice Lofts- MRI showed small rotator cuff tear -office will call him about date for possible surgery and preop visit. Plan is still for pt to have  EMG/NCV study of the left upper extremity.  Per Dr Joice Lofts sling for comfort and lifting precautions 10 lbs- wear swing to comfort. Can initiate AROM to shoulder. Pt to cont  wearing isotoner glove on L hand  and pt to get out of sling 2 hrs on and off 30 min to hour to comfort - alternating. Pt to do AROM and AAROM for elbow to digits 4 x day.  Pt can benefit from skilled OT services to decrease edema, pain and increase AROM and strength in L UE to be able to use L hand in functional tasks- pt had CVA in past but had full motion and strength with diminshed sensation in thumb and 2nd digit    OT Occupational Profile and History Problem Focused Assessment - Including review of records relating to presenting problem    Occupational performance deficits (Please refer to evaluation for details): ADL's;IADL's;Rest and Sleep;Work;Play;Social Participation;Leisure    Body Structure / Function / Physical Skills ADL;Edema;Dexterity;Decreased knowledge of use of DME;Decreased knowledge of precautions;Flexibility;ROM;UE functional use;FMC;Sensation;Pain;Strength;IADL;Proprioception;Coordination    Rehab Potential Fair    Clinical Decision Making Several treatment options, min-mod task  modification necessary    Comorbidities Affecting Occupational Performance: May have comorbidities impacting occupational performance    Modification or Assistance to Complete Evaluation  Min-Moderate modification of tasks or assist with assess necessary to complete eval    OT Frequency 2x / week    OT Duration 8 weeks    OT Treatment/Interventions Self-care/ADL training;Moist Heat;Paraffin;Fluidtherapy;Contrast Bath;DME and/or AE instruction;Therapeutic exercise;Splinting;Patient/family education;Therapeutic activities;Manual Therapy;Passive range of motion    Consulted and Agree with Plan of Care Patient;Family member/caregiver             Patient will benefit from skilled therapeutic intervention in order to improve the following deficits and impairments:   Body Structure / Function / Physical Skills: ADL, Edema, Dexterity, Decreased knowledge of use of DME, Decreased knowledge of precautions, Flexibility, ROM, UE functional use, FMC, Sensation, Pain, Strength, IADL, Proprioception, Coordination       Visit Diagnosis: Nerve injury  Pain in left arm  Stiffness of left elbow, not elsewhere classified  Stiffness of left wrist, not elsewhere classified  Stiffness of left hand, not elsewhere classified  Muscle weakness (generalized)    Problem List Patient Active Problem List   Diagnosis Date Noted   Acute CVA (cerebrovascular accident) 03/20/2021   HTN (hypertension) 03/20/2021   HLD (hyperlipidemia) 03/20/2021   Anemia 03/20/2021   Hypokalemia 03/20/2021    Oletta Cohn, OTR/L,CLT 02/14/2023, 7:14 PM  [facility] [address] [place], [address] Phone: [phone]   Fax:  [phone]  Name: Jimmy Montoya MRN: [id number] Date  of Birth: 29-Apr-1956

## 2023-02-19 ENCOUNTER — Ambulatory Visit: Payer: Medicare Other | Admitting: Occupational Therapy

## 2023-02-19 DIAGNOSIS — T148XXA Other injury of unspecified body region, initial encounter: Secondary | ICD-10-CM | POA: Diagnosis not present

## 2023-02-19 DIAGNOSIS — M25622 Stiffness of left elbow, not elsewhere classified: Secondary | ICD-10-CM

## 2023-02-19 DIAGNOSIS — M79602 Pain in left arm: Secondary | ICD-10-CM

## 2023-02-19 DIAGNOSIS — M25642 Stiffness of left hand, not elsewhere classified: Secondary | ICD-10-CM

## 2023-02-19 DIAGNOSIS — M6281 Muscle weakness (generalized): Secondary | ICD-10-CM

## 2023-02-19 DIAGNOSIS — M25632 Stiffness of left wrist, not elsewhere classified: Secondary | ICD-10-CM

## 2023-02-19 NOTE — Therapy (Signed)
[facility] [address], [place], [address] Phone: [phone]   Fax:  [phone]  Occupational Therapy Treatment  Patient Details  Name: Jimmy Montoya MRN: [id number] Date of Birth: [birth year] Referring Provider (OT): Dr Joice Lofts   Encounter Date: 02/19/2023   OT End of Session - 02/19/23 0947     Visit Number 4    Number of Visits 16    Date for OT Re-Evaluation 04/04/23    OT Start Time 0947    OT Stop Time 1040    OT Time Calculation (min) 53 min    Activity Tolerance Patient tolerated treatment well    Behavior During Therapy [facility] for tasks assessed/performed             Past Medical History:  Diagnosis Date   Anemia 03/20/2021   Flu 09/29/2015   Hernia, inguinal, right 09/02/2015   Hyperlipemia    Hypertension    Kidney stone     Past Surgical History:  Procedure Laterality Date   HERNIA REPAIR     INGUINAL HERNIA REPAIR Right 12/23/2015   Procedure: HERNIA REPAIR INGUINAL ADULT;  Surgeon: Nadeen Landau, MD;  Location: [facility] ORS;  Service: General;  Laterality: Right;    There were no vitals filed for this visit.   Subjective Assessment - 02/19/23 0947     Subjective  I have a new doctor that I went to on yesterday.  And I am scheduled for surgery on 14 May.  He sent this order for you to continue with my therapy.  I am taking the sling off and doing the exercises.  The pins-and-needles and burning pain is mostly over my thumb index middle finger and hand to wrist.  About a 6-7/10 -but are not taking any pain medicine anymore.    Pertinent History 02/01/23 Ortho note with DR Poggi- Jimmy Montoya is a 67 y.o. male who presents for evaluation and treatment of a left shoulder injury which occurred 1 week ago. Apparently he was leaving a horse while holding onto the rains when the horse suddenly reared causing his shoulder to dislocate. He presented to the emergency room where the shoulder was reduced  under conscious sedation. The patient notes that his shoulder is feeling reasonably comfortable but he has a lot of pain in the upper arm extending to the elbow region. He also notes numbness and tingling as well as pain extending from his elbow down to his wrist. He has been wearing his shoulder immobilizer on a regular basis, removing it for bathing purposes. He rates his pain at 8/10 on today's visit and has been taking naproxen and oxycodone as necessary with temporary partial relief of his symptoms. He is right-hand dominant. He denies any prior dislocation or other injury to the left shoulder in the past.The patient is quite concerned by the neurologic deficits he continues to experience after his acute shoulder dislocation. It does appear that he has some deficits, primarily involving the radial and ulnar nerve distributions, although he also appears to have some deficits with the musculocutaneous and median nerves as well. Therefore, I have recommended that he undergo an EMG/NCV study of the left upper extremity to assess these nerves for evidence of damage and reinnervation. In addition, he will be started in occupational therapy to work on range of motion and strength exercises to the left upper extremity. Finally, he will be sent for an MRI scan of the left shoulder to evaluate for rotator  cuff tear. Meanwhile, the patient will continue to wear his shoulder immobilizer on a regular basis, removing it for bathing purposes and for exercises. He may continue his present medication regimen, but is encouraged to wean off of his oxycodone as soon as possible.    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    Currently in Pain? Yes    Pain Score 7     Pain Location --   radial side of hand , digits and wrist to forearm   Pain Orientation Left    Pain Descriptors / Indicators Tingling;Radiating;Burning    Pain Type Acute pain     Pain Onset 1 to 4 weeks ago    Pain Frequency Constant              Patient arrived with left upper extremity in sling. Talked with Dr Joice Lofts  last time on phone while patient was here.  MRI showed small rotator cuff tear.  His office will be in contact about surgery date set up.  And a preop visit prior. Sling to be wear for comfort.  Can remove it at times.  Can also sleep without it. Can also start shoulder active range of motion. Pt arrive with a new order for OT to continue.  Patient seen Dr. Dewaine Montoya yesterday with very orthopedic clinic scheduled for 14 May for surgery to his shoulder.   Remove sling and reviewed with patient  scapular squeezes to continue with 10 reps.    Standing doing pendulum.  Patient to relax shoulder into flexion and do gentle pendulum pain-free.   3-4 times a day.   Patient had a hard time initially to relax.   Also removed sling and had patient just walk swinging on likely to side.   Done for 2 minutes relaxed more.   Shoulder flexion on table and pillowcase doing some sliding and reaching into shoulder flexion 20 reps pain-free Reviewed with patient elbow flexion and extension against the wall with back doing isometric for thumbs up and thumbs up both directions with active assisted range of motion for endrange 10 reps Passive range of motion for palm down elbow flexion extension in standing 10 reps    Upgrade left hand forearm and wrist home exercises for patient to do 10 reps with 1 pound weight with forearm support  contrast prior to digit range of motion and soft tissue Continue with Isotoner glove patient to wear to tolerance as well as Tubigrip D if needed to switch.    Active assisted range of motion for composite flexion of all digits with active assisted range of motion full extension 10 reps Patient showed increased MC extension this date as well as PIP. Patient is slight of the table maintaining extension of digits 10 reps Webspace  massage as well as metacarpal spreads by OT and wife wife prior to digit range of motion and thumb Thumb palmar radial abduction active assisted range of motion for endrange -pain with endrange flexion as well as palmar radial abduction 10 reps each                  OT Treatments/Exercises (OP) - 02/19/23 0001       LUE Contrast Bath   Time 8 minutes    Comments decrease edema and pain in hand , wrist prior to ROM  OT Education - 02/19/23 0947     Education Details progress and changes to HEP    Person(s) Educated Patient    Methods Explanation;Demonstration;Tactile cues;Verbal cues;Handout    Comprehension Verbal cues required;Returned demonstration;Verbalized understanding                 OT Long Term Goals - 02/07/23 1937       OT LONG TERM GOAL #1   Title Patient wife independent in home program to decrease edema and pain less 3-4/10 at rest show increased elbow, wrist and digit active into motion    Baseline Pain 6-9/10 from elbow down to digits as well as increased edema from elbow down to hand; decreased range of motion in elbow, wrist, forearm as well as to chest    Time 3    Period Weeks    Status New    Target Date 02/28/23      OT LONG TERM GOAL #2   Title L Elbow flexion and extension increased to within normal limits for patient able to initiate washing face and put hand through sleeve if    Baseline Elbow flexion and extention 2/5 - place and hold partial flexion - dropping hand cannot tolerate resistance for ext- pain 6-9/10 - unable to flex in pronation    Time 4    Period Weeks    Status New    Target Date 03/07/23      OT LONG TERM GOAL #3   Title L wrist  and forearm ROM and strength increase to 4-/5 to turn doorknob and push hand thru sleeve    Baseline Wrist in all planes decrease - pain  - and 3 to 4-/5 - in all planes- unable to use hand or wrist    Time 5    Period Weeks    Status New    Target Date  03/14/23      OT LONG TERM GOAL #4   Title L hand digits increase for pt to touch palm and digits extention WNL to donn gloves    Baseline decrease digits flexion MC's 60-70 and 2nd PIP 60; extnetion -30 to -40 MC's nad -30 for PIP    Time 5    Period Weeks    Status New    Target Date 03/14/23      OT LONG TERM GOAL #5   Title L thumb AROM increase to WNL for pt to hold cup and turn doorknob    Baseline L thumb flexion decrease greatly -and edema , pain - PA and RA 35 - tight in webspace    Time 6    Period Weeks    Status New    Target Date 03/21/23                   Plan - 02/19/23 0948     Clinical Impression Statement Pt refer to OT with diagnosis of L shoulder anterior dislocation on 01/24/23. Pt is R hand dominant. Apparently he was leading a horse while holding onto the rains when the horse suddenly reared causing his shoulder to dislocate. Pt was seen in ED where shoulder was reduced under conscious sedation. Pt present at eval with L UE in sling removing it for bathing purposes - rates his pain at 6-9/10 from elbow down to hand-nerve pain in L UE from elbow down to digits -with increase edema and decrease AROM in all planes for elbow, wrist , digits including thumb. Pt unable to use L  UE in any ADL's or IADL's - increase edema, pain and decrease ROM and strength .Pt making slow but steady progress in pain, edema as well as increase AROM in elbow , wrist and hand. Pt arrive today with new order from Dr Jimmy Montoya - seen yesterday and scheduled for surgery 14th May L shoulder- to cont with OT - pt not taking any pain medication and show increase strength in elbow and wrist -able to do some isometric  for elbow and 1 lbs weight for wrist /sup/pro with arm supported.  Pt  to have  EMG/NCV study of the left upper extremity in future.  Per Dr Joice Lofts sling for comfort and lifting precautions 10 lbs- wear swing to comfort. Initiated AROM to shoulder last session. Pt to cont  wearing  isotoner glove on L hand  and pt to get out of sling 2 hrs on and off 30 min to hour to comfort - alternating.  Pt can benefit from skilled OT services to decrease edema, pain and increase AROM and strength in L UE to be able to use L hand in functional tasks- pt had CVA in past but had full motion and strength with diminshed sensation in thumb and 2nd digit    OT Occupational Profile and History Problem Focused Assessment - Including review of records relating to presenting problem    Occupational performance deficits (Please refer to evaluation for details): ADL's;IADL's;Rest and Sleep;Work;Play;Social Participation;Leisure    Body Structure / Function / Physical Skills ADL;Edema;Dexterity;Decreased knowledge of use of DME;Decreased knowledge of precautions;Flexibility;ROM;UE functional use;FMC;Sensation;Pain;Strength;IADL;Proprioception;Coordination    Rehab Potential Fair    Clinical Decision Making Several treatment options, min-mod task modification necessary    Comorbidities Affecting Occupational Performance: May have comorbidities impacting occupational performance    Modification or Assistance to Complete Evaluation  Min-Moderate modification of tasks or assist with assess necessary to complete eval    OT Frequency 2x / week    OT Duration 8 weeks    OT Treatment/Interventions Self-care/ADL training;Moist Heat;Paraffin;Fluidtherapy;Contrast Bath;DME and/or AE instruction;Therapeutic exercise;Splinting;Patient/family education;Therapeutic activities;Manual Therapy;Passive range of motion    Consulted and Agree with Plan of Care Patient;Family member/caregiver             Patient will benefit from skilled therapeutic intervention in order to improve the following deficits and impairments:   Body Structure / Function / Physical Skills: ADL, Edema, Dexterity, Decreased knowledge of use of DME, Decreased knowledge of precautions, Flexibility, ROM, UE functional use, FMC, Sensation, Pain,  Strength, IADL, Proprioception, Coordination       Visit Diagnosis: Nerve injury  Pain in left arm  Stiffness of left elbow, not elsewhere classified  Stiffness of left hand, not elsewhere classified  Stiffness of left wrist, not elsewhere classified  Muscle weakness (generalized)    Problem List Patient Active Problem List   Diagnosis Date Noted   Acute CVA (cerebrovascular accident) 03/20/2021   HTN (hypertension) 03/20/2021   HLD (hyperlipidemia) 03/20/2021   Anemia 03/20/2021   Hypokalemia 03/20/2021    Jimmy Montoya, OTR/L,CLT 02/19/2023, 1:08 PM  [facility] [address] [place], [address] Phone: [phone]   Fax:  [phone]  Name: Jimmy Montoya MRN: [id number] Date of Birth: 16-Feb-1956

## 2023-02-21 ENCOUNTER — Ambulatory Visit: Payer: Medicare Other | Admitting: Occupational Therapy

## 2023-02-21 DIAGNOSIS — T148XXA Other injury of unspecified body region, initial encounter: Secondary | ICD-10-CM

## 2023-02-21 DIAGNOSIS — M79602 Pain in left arm: Secondary | ICD-10-CM

## 2023-02-21 DIAGNOSIS — M25632 Stiffness of left wrist, not elsewhere classified: Secondary | ICD-10-CM

## 2023-02-21 DIAGNOSIS — M25622 Stiffness of left elbow, not elsewhere classified: Secondary | ICD-10-CM

## 2023-02-21 DIAGNOSIS — M6281 Muscle weakness (generalized): Secondary | ICD-10-CM

## 2023-02-21 DIAGNOSIS — M25642 Stiffness of left hand, not elsewhere classified: Secondary | ICD-10-CM

## 2023-02-21 NOTE — Therapy (Signed)
[facility] [address] [place], [address] Phone: [phone]   Fax:  [phone]  Occupational Therapy Treatment  Patient Details  Name: Jimmy Montoya MRN: [id number] Date of Birth: 10-10-1956 Referring Provider (OT): Dr Joice Lofts   Encounter Date: 02/21/2023   OT End of Session - 02/21/23 0945     Visit Number 5    Number of Visits 16    Date for OT Re-Evaluation 04/04/23    OT Start Time 0945    OT Stop Time 1035    OT Time Calculation (min) 50 min    Activity Tolerance Patient tolerated treatment well    Behavior During Therapy [facility] for tasks assessed/performed             Past Medical History:  Diagnosis Date   Anemia 03/20/2021   Flu 09/29/2015   Hernia, inguinal, right 09/02/2015   Hyperlipemia    Hypertension    Kidney stone     Past Surgical History:  Procedure Laterality Date   HERNIA REPAIR     INGUINAL HERNIA REPAIR Right 12/23/2015   Procedure: HERNIA REPAIR INGUINAL ADULT;  Surgeon: Nadeen Landau, MD;  Location: [facility] ORS;  Service: General;  Laterality: Right;    There were no vitals filed for this visit.   Subjective Assessment - 02/21/23 0945     Subjective  Doing better - pain little better but every morning my hand is swollen and then stiff-    Pertinent History 02/01/23 Ortho note with DR Poggi- Jimmy Montoya is a 67 y.o. male who presents for evaluation and treatment of a left shoulder injury which occurred 1 week ago. Apparently he was leaving a horse while holding onto the rains when the horse suddenly reared causing his shoulder to dislocate. He presented to the emergency room where the shoulder was reduced under conscious sedation. The patient notes that his shoulder is feeling reasonably comfortable but he has a lot of pain in the upper arm extending to the elbow region. He also notes numbness and tingling as well as pain extending from his elbow down to his wrist. He has been  wearing his shoulder immobilizer on a regular basis, removing it for bathing purposes. He rates his pain at 8/10 on today's visit and has been taking naproxen and oxycodone as necessary with temporary partial relief of his symptoms. He is right-hand dominant. He denies any prior dislocation or other injury to the left shoulder in the past.The patient is quite concerned by the neurologic deficits he continues to experience after his acute shoulder dislocation. It does appear that he has some deficits, primarily involving the radial and ulnar nerve distributions, although he also appears to have some deficits with the musculocutaneous and median nerves as well. Therefore, I have recommended that he undergo an EMG/NCV study of the left upper extremity to assess these nerves for evidence of damage and reinnervation. In addition, he will be started in occupational therapy to work on range of motion and strength exercises to the left upper extremity. Finally, he will be sent for an MRI scan of the left shoulder to evaluate for rotator cuff tear. Meanwhile, the patient will continue to wear his shoulder immobilizer on a regular basis, removing it for bathing purposes and for exercises. He may continue his present medication regimen, but is encouraged to wean off of his oxycodone as soon as possible.    Patient Stated Goals I want to be able to use my  left arm and hand again to repair of the [place] take care of the animals.                [facility] OT Assessment - 02/21/23 0001       AROM   Left Elbow Flexion 130    Left Elbow Extension 0    Left Forearm Pronation 90 Degrees    Left Forearm Supination 85 Degrees    Left Wrist Extension 40 Degrees    Left Wrist Flexion 90 Degrees    Left Wrist Radial Deviation 20 Degrees    Left Wrist Ulnar Deviation 25 Degrees      Left Hand AROM   L Thumb Radial ADduction/ABduction 0-55 40    L Thumb Palmar ADduction/ABduction 0-45 45                       OT Treatments/Exercises (OP) - 02/21/23 0001       LUE Contrast Bath   Time 8 minutes    Comments decrease edema and pain - increase rom               Patient arrived with left upper extremity in sling.  Pt arrive  last session with a new order for OT to continue.  Patient seen Dr. Dewaine Conger  orthopedic clinic scheduled for 14 May for surgery to his shoulder.   Remove sling and reviewed with patient  scapular squeezes to continue with 10 reps.     Standing doing pendulum.  Patient to relax shoulder into flexion and do gentle pendulum pain-free.   3-4 times a day.   Patient had a hard time initially to relax.   Also removed sling and had patient just walk swinging on likely to side.   Done for 2 minutes relaxed more.   Shoulder flexion on table and pillowcase doing some sliding and reaching into shoulder flexion 20 reps pain-free Reviewed with patient elbow flexion and extension against the wall with back doing isometric for thumbs up and thumbs up both directions with active assisted range of motion for endrange 10 reps Passive range of motion for palm down elbow flexion extension in standing 10 reps     Upgrade left hand forearm and wrist home exercises for patient to do 10 reps with 1 pound weight with forearm support  Sup/pro, wrist extention - do prayer stretch prior  And RD, UD standing at side 12 reps contrast prior to digit range of motion and soft tissue Continue with Isotoner glove patient to wear to tolerance as well as Tubigrip D if needed to switch.     Active assisted range of motion for composite flexion of all digits with active assisted range of motion full extension 10 reps Patient showed increased MC extension this date as well as PIP. Add rolling over red roller and maintain extention of digits Add to HEP   Patient is slight of the table maintaining extension of digits 10 reps Webspace massage as well as metacarpal spreads by OT and  wife wife prior to digit range of motion and thumb Thumb palmar radial abduction active assisted range of motion for endrange -pain with endrange flexion as well as palmar radial abduction but improve after contrast 10 reps each         OT Education - 02/21/23 0945     Education Details progress and changes to HEP    Person(s) Educated Patient    Methods Explanation;Demonstration;Tactile cues;Verbal cues;Handout    Comprehension  Verbal cues required;Returned demonstration;Verbalized understanding                 OT Long Term Goals - 02/07/23 1937       OT LONG TERM GOAL #1   Title Patient wife independent in home program to decrease edema and pain less 3-4/10 at rest show increased elbow, wrist and digit active into motion    Baseline Pain 6-9/10 from elbow down to digits as well as increased edema from elbow down to hand; decreased range of motion in elbow, wrist, forearm as well as to chest    Time 3    Period Weeks    Status New    Target Date 02/28/23      OT LONG TERM GOAL #2   Title L Elbow flexion and extension increased to within normal limits for patient able to initiate washing face and put hand through sleeve if    Baseline Elbow flexion and extention 2/5 - place and hold partial flexion - dropping hand cannot tolerate resistance for ext- pain 6-9/10 - unable to flex in pronation    Time 4    Period Weeks    Status New    Target Date 03/07/23      OT LONG TERM GOAL #3   Title L wrist  and forearm ROM and strength increase to 4-/5 to turn doorknob and push hand thru sleeve    Baseline Wrist in all planes decrease - pain  - and 3 to 4-/5 - in all planes- unable to use hand or wrist    Time 5    Period Weeks    Status New    Target Date 03/14/23      OT LONG TERM GOAL #4   Title L hand digits increase for pt to touch palm and digits extention WNL to donn gloves    Baseline decrease digits flexion MC's 60-70 and 2nd PIP 60; extnetion -30 to -40 MC's nad -30  for PIP    Time 5    Period Weeks    Status New    Target Date 03/14/23      OT LONG TERM GOAL #5   Title L thumb AROM increase to WNL for pt to hold cup and turn doorknob    Baseline L thumb flexion decrease greatly -and edema , pain - PA and RA 35 - tight in webspace    Time 6    Period Weeks    Status New    Target Date 03/21/23                   Plan - 02/21/23 0945     Clinical Impression Statement Pt refer to OT with diagnosis of L shoulder anterior dislocation on 01/24/23. Pt is R hand dominant. Apparently he was leading a horse while holding onto the rains when the horse suddenly reared causing his shoulder to dislocate. Pt was seen in ED where shoulder was reduced under conscious sedation. Pt present at eval with L UE in sling removing it for bathing purposes - rates his pain at 6-9/10 from elbow down to hand-nerve pain in L UE from elbow down to digits -with increase edema and decrease AROM in all planes for elbow, wrist , digits including thumb. Pt unable to use L UE in any ADL's or IADL's - increase edema, pain and decrease ROM and strength .Pt making slow but steady progress in pain, edema as well as increase AROM in elbow , wrist  and hand. Pt arrive this week with new order from Dr Dewaine Conger - and scheduled for surgery 14th May L shoulder- to cont with OT - pt not taking any pain medication and show increase strength in elbow and wrist -able to do some isometric  for elbow and 1 lbs weight for wrist in all planes and  /sup/pro with arm supported.  Pt showed increase digits extention this date- but limited in edema and stiffness in hand. Reinforce contrast and isotoner glove use.  Pt  to have  EMG/NCV study of the left upper extremity in future.  Initiated AROM to shoulder pendulum and shoulder flexion.  Pt to cont  wearing isotoner glove on L hand  and pt to get out of sling more and more.  Pt can benefit from skilled OT services to decrease edema, pain and increase AROM and  strength in L UE to be able to use L hand in functional tasks- pt had CVA in past but had full motion and strength with diminshed sensation in thumb and 2nd digit    OT Occupational Profile and History Problem Focused Assessment - Including review of records relating to presenting problem    Occupational performance deficits (Please refer to evaluation for details): ADL's;IADL's;Rest and Sleep;Work;Play;Social Participation;Leisure    Body Structure / Function / Physical Skills ADL;Edema;Dexterity;Decreased knowledge of use of DME;Decreased knowledge of precautions;Flexibility;ROM;UE functional use;FMC;Sensation;Pain;Strength;IADL;Proprioception;Coordination    Rehab Potential Fair    Clinical Decision Making Several treatment options, min-mod task modification necessary    Comorbidities Affecting Occupational Performance: May have comorbidities impacting occupational performance    Modification or Assistance to Complete Evaluation  Min-Moderate modification of tasks or assist with assess necessary to complete eval    OT Frequency 2x / week    OT Duration 8 weeks    OT Treatment/Interventions Self-care/ADL training;Moist Heat;Paraffin;Fluidtherapy;Contrast Bath;DME and/or AE instruction;Therapeutic exercise;Splinting;Patient/family education;Therapeutic activities;Manual Therapy;Passive range of motion    Consulted and Agree with Plan of Care Patient;Family member/caregiver             Patient will benefit from skilled therapeutic intervention in order to improve the following deficits and impairments:   Body Structure / Function / Physical Skills: ADL, Edema, Dexterity, Decreased knowledge of use of DME, Decreased knowledge of precautions, Flexibility, ROM, UE functional use, FMC, Sensation, Pain, Strength, IADL, Proprioception, Coordination       Visit Diagnosis: Nerve injury  Pain in left arm  Stiffness of left hand, not elsewhere classified  Stiffness of left wrist, not elsewhere  classified  Stiffness of left elbow, not elsewhere classified  Muscle weakness (generalized)    Problem List Patient Active Problem List   Diagnosis Date Noted   Acute CVA (cerebrovascular accident) 03/20/2021   HTN (hypertension) 03/20/2021   HLD (hyperlipidemia) 03/20/2021   Anemia 03/20/2021   Hypokalemia 03/20/2021    Oletta Cohn, OTR/L,CLT 02/21/2023, 4:05 PM  [facility] [address] [place], [address] Phone: [phone]   Fax:  [phone]  Name: Jimmy Montoya MRN: [id number] Date of Birth: 09-Jun-1956

## 2023-02-25 ENCOUNTER — Encounter: Payer: Medicare Other | Admitting: Occupational Therapy

## 2023-02-26 ENCOUNTER — Ambulatory Visit: Payer: Medicare Other | Admitting: Occupational Therapy

## 2023-02-26 DIAGNOSIS — M79602 Pain in left arm: Secondary | ICD-10-CM

## 2023-02-26 DIAGNOSIS — T148XXA Other injury of unspecified body region, initial encounter: Secondary | ICD-10-CM | POA: Diagnosis not present

## 2023-02-26 DIAGNOSIS — M25642 Stiffness of left hand, not elsewhere classified: Secondary | ICD-10-CM

## 2023-02-26 DIAGNOSIS — M25632 Stiffness of left wrist, not elsewhere classified: Secondary | ICD-10-CM

## 2023-02-26 DIAGNOSIS — M25622 Stiffness of left elbow, not elsewhere classified: Secondary | ICD-10-CM

## 2023-02-26 DIAGNOSIS — M6281 Muscle weakness (generalized): Secondary | ICD-10-CM

## 2023-02-26 NOTE — Therapy (Signed)
[facility] [address] [place], [address] Phone: [phone]   Fax:  [phone]  Occupational Therapy Treatment  Patient Details  Name: Jimmy Montoya MRN: [id number] Date of Birth: [birth year] Referring Provider (OT): Dr Joice Lofts   Encounter Date: 02/26/2023   OT End of Session - 02/26/23 1313     Visit Number 6    Number of Visits 16    Date for OT Re-Evaluation 04/04/23    OT Start Time 1313    OT Stop Time 1400    OT Time Calculation (min) 47 min    Activity Tolerance Patient tolerated treatment well    Behavior During Therapy [facility] for tasks assessed/performed             Past Medical History:  Diagnosis Date   Anemia 03/20/2021   Flu 09/29/2015   Hernia, inguinal, right 09/02/2015   Hyperlipemia    Hypertension    Kidney stone     Past Surgical History:  Procedure Laterality Date   HERNIA REPAIR     INGUINAL HERNIA REPAIR Right 12/23/2015   Procedure: HERNIA REPAIR INGUINAL ADULT;  Surgeon: Nadeen Landau, MD;  Location: [facility] ORS;  Service: General;  Laterality: Right;    There were no vitals filed for this visit.   Subjective Assessment - 02/26/23 1313     Subjective  Got new sling -I am not sleeping with my glove- my hand is swollen and feel heavy- do I need to sleep with glove- nerve conduction for 9th schedule    Pertinent History 02/01/23 Ortho note with DR Poggi- Jimmy Montoya is a 67 y.o. male who presents for evaluation and treatment of a left shoulder injury which occurred 1 week ago. Apparently he was leaving a horse while holding onto the rains when the horse suddenly reared causing his shoulder to dislocate. He presented to the emergency room where the shoulder was reduced under conscious sedation. The patient notes that his shoulder is feeling reasonably comfortable but he has a lot of pain in the upper arm extending to the elbow region. He also notes numbness and tingling as well as pain  extending from his elbow down to his wrist. He has been wearing his shoulder immobilizer on a regular basis, removing it for bathing purposes. He rates his pain at 8/10 on today's visit and has been taking naproxen and oxycodone as necessary with temporary partial relief of his symptoms. He is right-hand dominant. He denies any prior dislocation or other injury to the left shoulder in the past.The patient is quite concerned by the neurologic deficits he continues to experience after his acute shoulder dislocation. It does appear that he has some deficits, primarily involving the radial and ulnar nerve distributions, although he also appears to have some deficits with the musculocutaneous and median nerves as well. Therefore, I have recommended that he undergo an EMG/NCV study of the left upper extremity to assess these nerves for evidence of damage and reinnervation. In addition, he will be started in occupational therapy to work on range of motion and strength exercises to the left upper extremity. Finally, he will be sent for an MRI scan of the left shoulder to evaluate for rotator cuff tear. Meanwhile, the patient will continue to wear his shoulder immobilizer on a regular basis, removing it for bathing purposes and for exercises. He may continue his present medication regimen, but is encouraged to wean off of his oxycodone as soon as possible.  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    Currently in Pain? Yes    Pain Score 5     Pain Location --   Radial hand   Pain Orientation Left    Pain Descriptors / Indicators Tingling;Burning    Pain Type Acute pain                Patient arrived with increased edema in left hand and wrist.  Patient report not wearing his Isotoner glove at nighttime as he should.           OT Treatments/Exercises (OP) - 02/26/23 0001       Moist Heat Therapy   Number Minutes  Moist Heat 8 Minutes    Moist Heat Location Shoulder   prior to AAROM     LUE Contrast Bath   Time 8 minutes    Comments decrease edema, pain prior toORM hand and wrist            Some soft tissue massage done to webspace with palmar radial abduction of thumb as well as metacarpal spreads after some MLD techniques to decrease edema in left hand and wrist Prior to active assisted range of motion for palmar radial abduction as well as wrist extension stretch composite prior to attempts of tapping digit extension. Patient does better with sliding of table maintaining digit extension while resupported. Remind patient to do wrist extension stretches prior to attempts of digit extension and tapping. Will assess if patient needs a wrist brace.   In supine patient using a wand for active assisted range of motion for chest press into 80 degrees of shoulder flexion 3 sets of 5 Shoulder flexion to 80 degrees 3 sets of 5 Add to home exercises And supine elbow flexion with thumbs up and palm up done 3 sets of 5 with 1 pound weight able to grasp Needs some assistance to initiate fist 30 degrees with palm up Add to home exercises above exercises Continue with 1 pound weight for supination pronation with hand supported as well as radial ulnar deviation and wrist flexion extension 12 reps Patient this date able to grip 1 pound weight during elbow and wrist exercises as well as 1 during shoulder exercises Tolerating all very well Pain and numbness and pins-and-needles decreasing in left hand as well as area of involvement smaller.     OT Education - 02/26/23 1313     Education Details progress and changes to HEP    Person(s) Educated Patient    Methods Explanation;Demonstration;Tactile cues;Verbal cues;Handout    Comprehension Verbal cues required;Returned demonstration;Verbalized understanding                 OT Long Term Goals - 02/07/23 1937       OT LONG TERM GOAL #1   Title  Patient wife independent in home program to decrease edema and pain less 3-4/10 at rest show increased elbow, wrist and digit active into motion    Baseline Pain 6-9/10 from elbow down to digits as well as increased edema from elbow down to hand; decreased range of motion in elbow, wrist, forearm as well as to chest    Time 3    Period Weeks    Status New    Target Date 02/28/23      OT LONG TERM GOAL #2   Title L Elbow flexion and extension increased  to within normal limits for patient able to initiate washing face and put hand through sleeve if    Baseline Elbow flexion and extention 2/5 - place and hold partial flexion - dropping hand cannot tolerate resistance for ext- pain 6-9/10 - unable to flex in pronation    Time 4    Period Weeks    Status New    Target Date 03/07/23      OT LONG TERM GOAL #3   Title L wrist  and forearm ROM and strength increase to 4-/5 to turn doorknob and push hand thru sleeve    Baseline Wrist in all planes decrease - pain  - and 3 to 4-/5 - in all planes- unable to use hand or wrist    Time 5    Period Weeks    Status New    Target Date 03/14/23      OT LONG TERM GOAL #4   Title L hand digits increase for pt to touch palm and digits extention WNL to donn gloves    Baseline decrease digits flexion MC's 60-70 and 2nd PIP 60; extnetion -30 to -40 MC's nad -30 for PIP    Time 5    Period Weeks    Status New    Target Date 03/14/23      OT LONG TERM GOAL #5   Title L thumb AROM increase to WNL for pt to hold cup and turn doorknob    Baseline L thumb flexion decrease greatly -and edema , pain - PA and RA 35 - tight in webspace    Time 6    Period Weeks    Status New    Target Date 03/21/23                   Plan - 02/26/23 1313     Clinical Impression Statement Pt refer to OT with diagnosis of L shoulder anterior dislocation on 01/24/23. Pt is R hand dominant. Apparently he was leading a horse while holding onto the rains when the horse  suddenly reared causing his shoulder to dislocate. Pt was seen in ED where shoulder was reduced under conscious sedation. Pt present at eval with L UE in sling removing it for bathing purposes - rates his pain at 6-9/10 from elbow down to hand-nerve pain in L UE from elbow down to digits -with increase edema and decrease AROM in all planes for elbow, wrist , digits including thumb. Pt unable to use L UE in any ADL's or IADL's - increase edema, pain and decrease ROM and strength . Pt arrive last week with new order from Dr Dewaine Conger - and scheduled for surgery 14th May L shoulder and nerve conduction 9th May. Pt not taking any pain medication and show decreased pain -only elbow radial wrist and hand now 5/10 was in the shoulder with range of motion.  Patient show increase range of motion and strength in left hand, wrist and elbow well.  Initiated some strengthening in supine for elbow flexion with 1 pound weight.  Initiated in supine active assisted range of motion for shoulder flexion.  Tolerating well.  Initiated last week 1 lbs weight for wrist in all planes and  /sup/pro with arm supported.  Pt showed increase digits extention - but compensate still with wrist flexion , and tightness in flexors, thumb - limited in edema and stiffness in hand. Reinforce contrast and isotoner glove use.   Pt to cont  wearing isotoner glove on L hand  and pt to get out of sling more and more.  Pt can benefit from skilled OT services to decrease edema, pain and increase AROM and strength in L UE to be able to use L hand in functional tasks- pt had CVA in past but had full motion and strength with diminshed sensation in thumb and 2nd digit    OT Occupational Profile and History Problem Focused Assessment - Including review of records relating to presenting problem    Occupational performance deficits (Please refer to evaluation for details): ADL's;IADL's;Rest and Sleep;Work;Play;Social Participation;Leisure    Body Structure /  Function / Physical Skills ADL;Edema;Dexterity;Decreased knowledge of use of DME;Decreased knowledge of precautions;Flexibility;ROM;UE functional use;FMC;Sensation;Pain;Strength;IADL;Proprioception;Coordination    Rehab Potential Fair    Clinical Decision Making Several treatment options, min-mod task modification necessary    Comorbidities Affecting Occupational Performance: May have comorbidities impacting occupational performance    Modification or Assistance to Complete Evaluation  Min-Moderate modification of tasks or assist with assess necessary to complete eval    OT Frequency 2x / week    OT Duration 8 weeks    OT Treatment/Interventions Self-care/ADL training;Moist Heat;Paraffin;Fluidtherapy;Contrast Bath;DME and/or AE instruction;Therapeutic exercise;Splinting;Patient/family education;Therapeutic activities;Manual Therapy;Passive range of motion    Consulted and Agree with Plan of Care Patient;Family member/caregiver             Patient will benefit from skilled therapeutic intervention in order to improve the following deficits and impairments:   Body Structure / Function / Physical Skills: ADL, Edema, Dexterity, Decreased knowledge of use of DME, Decreased knowledge of precautions, Flexibility, ROM, UE functional use, FMC, Sensation, Pain, Strength, IADL, Proprioception, Coordination       Visit Diagnosis: Nerve injury  Stiffness of left hand, not elsewhere classified  Stiffness of left wrist, not elsewhere classified  Stiffness of left elbow, not elsewhere classified  Pain in left arm  Muscle weakness (generalized)    Problem List Patient Active Problem List   Diagnosis Date Noted   Acute CVA (cerebrovascular accident) (HCC) 03/20/2021   HTN (hypertension) 03/20/2021   HLD (hyperlipidemia) 03/20/2021   Anemia 03/20/2021   Hypokalemia 03/20/2021    Oletta Cohn, OTR/L,CLT 02/26/2023, 4:47 PM  [facility] [address] [place], [address] Phone: [phone]   Fax:  [phone]  Name: Dayyan Krist MRN: [id number] Date of Birth: 21-Nov-1955

## 2023-02-28 ENCOUNTER — Ambulatory Visit: Payer: Medicare Other | Attending: Orthopedic Surgery | Admitting: Occupational Therapy

## 2023-02-28 DIAGNOSIS — M79602 Pain in left arm: Secondary | ICD-10-CM

## 2023-02-28 DIAGNOSIS — M25512 Pain in left shoulder: Secondary | ICD-10-CM | POA: Insufficient documentation

## 2023-02-28 DIAGNOSIS — T148XXA Other injury of unspecified body region, initial encounter: Secondary | ICD-10-CM | POA: Diagnosis present

## 2023-02-28 DIAGNOSIS — M25622 Stiffness of left elbow, not elsewhere classified: Secondary | ICD-10-CM | POA: Diagnosis present

## 2023-02-28 DIAGNOSIS — M25632 Stiffness of left wrist, not elsewhere classified: Secondary | ICD-10-CM | POA: Diagnosis present

## 2023-02-28 DIAGNOSIS — M25642 Stiffness of left hand, not elsewhere classified: Secondary | ICD-10-CM | POA: Diagnosis present

## 2023-02-28 DIAGNOSIS — M25612 Stiffness of left shoulder, not elsewhere classified: Secondary | ICD-10-CM | POA: Insufficient documentation

## 2023-02-28 DIAGNOSIS — M6281 Muscle weakness (generalized): Secondary | ICD-10-CM

## 2023-02-28 NOTE — Therapy (Signed)
[facility] [address] [place], [address] Phone: [phone]   Fax:  [phone]  Occupational Therapy Treatment  Patient Details  Name: Jimmy Montoya MRN: [id number] Date of Birth: 1956-10-25 Referring Provider (OT): Dr Joice Lofts   Encounter Date: 02/28/2023   OT End of Session - 02/28/23 0815     Visit Number 7    Number of Visits 16    Date for OT Re-Evaluation 04/04/23    OT Start Time 0815    OT Stop Time 0900    OT Time Calculation (min) 45 min    Activity Tolerance Patient tolerated treatment well    Behavior During Therapy [facility] for tasks assessed/performed             Past Medical History:  Diagnosis Date   Anemia 03/20/2021   Flu 09/29/2015   Hernia, inguinal, right 09/02/2015   Hyperlipemia    Hypertension    Kidney stone     Past Surgical History:  Procedure Laterality Date   HERNIA REPAIR     INGUINAL HERNIA REPAIR Right 12/23/2015   Procedure: HERNIA REPAIR INGUINAL ADULT;  Surgeon: Nadeen Landau, MD;  Location: [facility] ORS;  Service: General;  Laterality: Right;    There were no vitals filed for this visit.   Subjective Assessment - 02/28/23 0815     Subjective  I did something yesterday taking off my sling and pull something in forearm - pain increase 7/10 forearm into the hand - don't know what I did    Pertinent History 02/01/23 Ortho note with DR Poggi- Jimmy Montoya is a 67 y.o. male who presents for evaluation and treatment of a left shoulder injury which occurred 1 week ago. Apparently he was leaving a horse while holding onto the rains when the horse suddenly reared causing his shoulder to dislocate. He presented to the emergency room where the shoulder was reduced under conscious sedation. The patient notes that his shoulder is feeling reasonably comfortable but he has a lot of pain in the upper arm extending to the elbow region. He also notes numbness and tingling as well as pain  extending from his elbow down to his wrist. He has been wearing his shoulder immobilizer on a regular basis, removing it for bathing purposes. He rates his pain at 8/10 on today's visit and has been taking naproxen and oxycodone as necessary with temporary partial relief of his symptoms. He is right-hand dominant. He denies any prior dislocation or other injury to the left shoulder in the past.The patient is quite concerned by the neurologic deficits he continues to experience after his acute shoulder dislocation. It does appear that he has some deficits, primarily involving the radial and ulnar nerve distributions, although he also appears to have some deficits with the musculocutaneous and median nerves as well. Therefore, I have recommended that he undergo an EMG/NCV study of the left upper extremity to assess these nerves for evidence of damage and reinnervation. In addition, he will be started in occupational therapy to work on range of motion and strength exercises to the left upper extremity. Finally, he will be sent for an MRI scan of the left shoulder to evaluate for rotator cuff tear. Meanwhile, the patient will continue to wear his shoulder immobilizer on a regular basis, removing it for bathing purposes and for exercises. He may continue his present medication regimen, but is encouraged to wean off of his oxycodone as soon as possible.  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               Patient arrive with reports of increased pain and volar forearm since yesterday when trying to pull out a piece arm out of the sling. Pain increased with 7-8/10 again -radiating into the hand. Upon assessment appear patient tenderness over forearm flexors-digit flexors more than wrist flexors.   With gripping 1 pound weight for elbow exercises patient probably initiated with grip and wrist flexion and then elbow  flexion Observed before patient dropping also into wrist flexion with attempts of extension of digits.           OT Treatments/Exercises (OP) - 02/28/23 0001       LUE Contrast Bath   Time 8 minutes    Comments decrease pain and edema in forearm and hand            Some soft tissue massage done to webspace with palmar radial abduction of thumb as well as metacarpal spreads after some MLD techniques to decrease edema in left hand and wrist Also use Graston #2 for sweeping over volar wrist and forearm prior to passive range of motion to wrist extension and digit extension. Pain decrease   Active assisted range of motion for palmar radial abduction as well as wrist extension stretch composite prior to attempts of tapping digit extension. Patient does better with sliding of table maintaining digit extension while resupported. Remind patient to do wrist extension stretches prior to attempts of digit extension and tapping. Fitted patient with a wrist brace to be able to do digit extension to release objects without flexing the wrist. Add thumb <> digits opposition -golf ball- - decrease thumb opposition but able to initiate more digits flexion      Pulley's for shoulder flexion and ABD 20 reps each - stop when feeling pull Add to HEP - can alternate with using a wand for active assisted range of motion for chest press into 80 degrees of shoulder flexion 3 sets of 5 in supine Shoulder flexion to 80 degrees 3 sets of 5 Add to home exercises  Pt to hold off for 24-48 hrs with 1 lbs elbow and forearm HEP to decrease forearm pain And supine elbow flexion with thumbs up and palm up done 3 sets of 5 with 1 pound weight able to grasp Needs some assistance to initiate fist 30 degrees with palm up Add to home exercises above exercises Continue with 1 pound weight for supination pronation with hand supported as well as radial ulnar deviation and wrist flexion extension 12 reps Patient  this date able to grip 1 pound weight during elbow and wrist exercises as well as 1 during shoulder exercises Tolerating all very well Pain and numbness and pins-and-needles decreasing in left hand as well as area of involvement smaller.          OT Education - 02/28/23 [phone]     Education Details progress and changes to HEP    Person(s) Educated Patient    Methods Explanation;Demonstration;Tactile cues;Verbal cues;Handout    Comprehension Verbal cues required;Returned demonstration;Verbalized understanding                 OT Long Term Goals - 02/07/23 1937       OT LONG TERM GOAL #1   Title Patient wife independent in home program to decrease edema  and pain less 3-4/10 at rest show increased elbow, wrist and digit active into motion    Baseline Pain 6-9/10 from elbow down to digits as well as increased edema from elbow down to hand; decreased range of motion in elbow, wrist, forearm as well as to chest    Time 3    Period Weeks    Status New    Target Date 02/28/23      OT LONG TERM GOAL #2   Title L Elbow flexion and extension increased to within normal limits for patient able to initiate washing face and put hand through sleeve if    Baseline Elbow flexion and extention 2/5 - place and hold partial flexion - dropping hand cannot tolerate resistance for ext- pain 6-9/10 - unable to flex in pronation    Time 4    Period Weeks    Status New    Target Date 03/07/23      OT LONG TERM GOAL #3   Title L wrist  and forearm ROM and strength increase to 4-/5 to turn doorknob and push hand thru sleeve    Baseline Wrist in all planes decrease - pain  - and 3 to 4-/5 - in all planes- unable to use hand or wrist    Time 5    Period Weeks    Status New    Target Date 03/14/23      OT LONG TERM GOAL #4   Title L hand digits increase for pt to touch palm and digits extention WNL to donn gloves    Baseline decrease digits flexion MC's 60-70 and 2nd PIP 60; extnetion -30 to -40  MC's nad -30 for PIP    Time 5    Period Weeks    Status New    Target Date 03/14/23      OT LONG TERM GOAL #5   Title L thumb AROM increase to WNL for pt to hold cup and turn doorknob    Baseline L thumb flexion decrease greatly -and edema , pain - PA and RA 35 - tight in webspace    Time 6    Period Weeks    Status New    Target Date 03/21/23                   Plan - 02/28/23 0815     Clinical Impression Statement Pt refer to OT with diagnosis of L shoulder anterior dislocation on 01/24/23. Pt is R hand dominant. Apparently he was leading a horse while holding onto the rains when the horse suddenly reared causing his shoulder to dislocate. Pt was seen in ED where shoulder was reduced under conscious sedation. Pt present at eval with L UE in sling removing it for bathing purposes - rates his pain at 6-9/10 from elbow down to hand-nerve pain in L UE from elbow down to digits -with increase edema and decrease AROM in all planes for elbow, wrist , digits including thumb. Pt unable to use L UE in any ADL's or IADL's - increase edema, pain and decrease ROM and strength . Pt arrive last week with new order from Dr Dewaine Conger - and scheduled for surgery 14th May L shoulder and nerve conduction 9th May. Pt not taking any pain medication and show decreased pain up to last session but came in today with increase pain over volar forearm - appear pt strain flexors- pain decrease after the contrast , soft tissue and PROM /AAROM for digits and wrist.  Pt to hold off on any 1lbs exercises the next 2 days.  Patient show increase range of motion and strength in left hand, wrist and elbow every session.  Initiated last session  strengthening in supine for elbow flexion with 1 pound weight.  Initiated in supine active assisted range of motion for shoulder flexion.  Tolerating well.  Initiated last week 1 lbs weight for wrist in all planes and  /sup/pro with arm supported.  Fitted pt with wrist splint to support  wrist to increase ease for digits extention and initiate opposition of 3-4 cm object. Reinforce contrast and isotoner glove use.   Pt to cont  wearing isotoner glove on L hand  and pt to get out of sling more and more.  Pt can benefit from skilled OT services to decrease edema, pain and increase AROM and strength in L UE to be able to use L hand in functional tasks- pt had CVA in past but had full motion and strength with diminshed sensation in thumb and 2nd digit    OT Occupational Profile and History Problem Focused Assessment - Including review of records relating to presenting problem    Occupational performance deficits (Please refer to evaluation for details): ADL's;IADL's;Rest and Sleep;Work;Play;Social Participation;Leisure    Body Structure / Function / Physical Skills ADL;Edema;Dexterity;Decreased knowledge of use of DME;Decreased knowledge of precautions;Flexibility;ROM;UE functional use;FMC;Sensation;Pain;Strength;IADL;Proprioception;Coordination    Rehab Potential Fair    Clinical Decision Making Several treatment options, min-mod task modification necessary    Comorbidities Affecting Occupational Performance: May have comorbidities impacting occupational performance    Modification or Assistance to Complete Evaluation  Min-Moderate modification of tasks or assist with assess necessary to complete eval    OT Frequency 2x / week    OT Duration 8 weeks    OT Treatment/Interventions Self-care/ADL training;Moist Heat;Paraffin;Fluidtherapy;Contrast Bath;DME and/or AE instruction;Therapeutic exercise;Splinting;Patient/family education;Therapeutic activities;Manual Therapy;Passive range of motion    Consulted and Agree with Plan of Care Patient;Family member/caregiver             Patient will benefit from skilled therapeutic intervention in order to improve the following deficits and impairments:   Body Structure / Function / Physical Skills: ADL, Edema, Dexterity, Decreased knowledge of  use of DME, Decreased knowledge of precautions, Flexibility, ROM, UE functional use, FMC, Sensation, Pain, Strength, IADL, Proprioception, Coordination       Visit Diagnosis: Nerve injury  Stiffness of left hand, not elsewhere classified  Stiffness of left wrist, not elsewhere classified  Stiffness of left elbow, not elsewhere classified  Pain in left arm  Muscle weakness (generalized)    Problem List Patient Active Problem List   Diagnosis Date Noted   Acute CVA (cerebrovascular accident) (HCC) 03/20/2021   HTN (hypertension) 03/20/2021   HLD (hyperlipidemia) 03/20/2021   Anemia 03/20/2021   Hypokalemia 03/20/2021    Oletta Cohn, OTR/L,CLT 02/28/2023, 9:18 AM  [facility] [address] [place], [address] Phone: [phone]   Fax:  [phone]  Name: Jimmy Montoya MRN: [id number] Date of Birth: 05-22-1956

## 2023-03-05 ENCOUNTER — Ambulatory Visit: Payer: Medicare Other | Admitting: Occupational Therapy

## 2023-03-05 DIAGNOSIS — M79602 Pain in left arm: Secondary | ICD-10-CM

## 2023-03-05 DIAGNOSIS — T148XXA Other injury of unspecified body region, initial encounter: Secondary | ICD-10-CM | POA: Diagnosis not present

## 2023-03-05 DIAGNOSIS — M6281 Muscle weakness (generalized): Secondary | ICD-10-CM

## 2023-03-05 DIAGNOSIS — M25642 Stiffness of left hand, not elsewhere classified: Secondary | ICD-10-CM

## 2023-03-05 DIAGNOSIS — M25622 Stiffness of left elbow, not elsewhere classified: Secondary | ICD-10-CM

## 2023-03-05 DIAGNOSIS — M25632 Stiffness of left wrist, not elsewhere classified: Secondary | ICD-10-CM

## 2023-03-05 NOTE — Therapy (Signed)
[facility] [address], [place], [address] Phone: [phone]   Fax:  [phone]  Occupational Therapy Treatment  Patient Details  Name: Laureano Collin MRN: [id number] Date of Birth: 18-Oct-1956 Referring Provider (OT): Dr Joice Lofts   Encounter Date: 03/05/2023   OT End of Session - 03/05/23 0835     Visit Number 8    Number of Visits 16    Date for OT Re-Evaluation 04/04/23    OT Start Time 0821    OT Stop Time 0905    OT Time Calculation (min) 44 min    Activity Tolerance Patient tolerated treatment well    Behavior During Therapy [facility] for tasks assessed/performed             Past Medical History:  Diagnosis Date   Anemia 03/20/2021   Flu 09/29/2015   Hernia, inguinal, right 09/02/2015   Hyperlipemia    Hypertension    Kidney stone     Past Surgical History:  Procedure Laterality Date   HERNIA REPAIR     INGUINAL HERNIA REPAIR Right 12/23/2015   Procedure: HERNIA REPAIR INGUINAL ADULT;  Surgeon: Nadeen Landau, MD;  Location: [facility] ORS;  Service: General;  Laterality: Right;    There were no vitals filed for this visit.   Subjective Assessment - 03/05/23 0833     Subjective  I had the nerve conduction test yesterday-that was tough - mama mia - done the exercises - mostly in the evening - going more without my sling -did not sleep with my glove -hand swollen    Pertinent History 02/01/23 Ortho note with DR Poggi- Jove Hanninen is a 67 y.o. male who presents for evaluation and treatment of a left shoulder injury which occurred 1 week ago. Apparently he was leaving a horse while holding onto the rains when the horse suddenly reared causing his shoulder to dislocate. He presented to the emergency room where the shoulder was reduced under conscious sedation. The patient notes that his shoulder is feeling reasonably comfortable but he has a lot of pain in the upper arm extending to the elbow region. He also notes  numbness and tingling as well as pain extending from his elbow down to his wrist. He has been wearing his shoulder immobilizer on a regular basis, removing it for bathing purposes. He rates his pain at 8/10 on today's visit and has been taking naproxen and oxycodone as necessary with temporary partial relief of his symptoms. He is right-hand dominant. He denies any prior dislocation or other injury to the left shoulder in the past.The patient is quite concerned by the neurologic deficits he continues to experience after his acute shoulder dislocation. It does appear that he has some deficits, primarily involving the radial and ulnar nerve distributions, although he also appears to have some deficits with the musculocutaneous and median nerves as well. Therefore, I have recommended that he undergo an EMG/NCV study of the left upper extremity to assess these nerves for evidence of damage and reinnervation. In addition, he will be started in occupational therapy to work on range of motion and strength exercises to the left upper extremity. Finally, he will be sent for an MRI scan of the left shoulder to evaluate for rotator cuff tear. Meanwhile, the patient will continue to wear his shoulder immobilizer on a regular basis, removing it for bathing purposes and for exercises. He may continue his present medication regimen, but is encouraged to wean off of his  oxycodone as soon as possible.    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    Currently in Pain? Yes    Pain Score 5     Pain Location --   radial forearm   Pain Orientation Left    Pain Descriptors / Indicators Aching;Tightness;Numbness;Tingling    Pain Type Acute pain    Pain Frequency Intermittent                 Observed patient out of sling for left upper extremity.  Patient is a tendency to carry left arm instead of relaxing into full elbow extension. Patient  with a wrist splint on.  Able to extend digits with more ease without dropping into flexion at the wrist. Remind patient to take wrist splint off to work on [facility] flexion as well as composite flexion         OT Treatments/Exercises (OP) - 03/05/23 0001       LUE Contrast Bath   Time 8 minutes    Comments decrease edema , pain and increase ROM            Some soft tissue massage done to webspace with palmar/ radial abduction of thumb as well as metacarpal spreads after some MLD techniques to decrease edema in left hand and wrist   Active assisted range of motion for palmar radial abduction as well as wrist extension stretch composite prior to attempts of tapping digit extension. Patient with increased digit extension this date  Digit flexion little decreased compared to before -remind patient to remove wrist splint to work on composite as well as MC flexion  remind patient to do wrist extension stretches prior to attempts of digit extension and tapping. Patient to continue with a wrist brace to be able to do digit extension to release objects without flexing the wrist. Add thumb <> digits opposition -golf ball- - decrease thumb opposition but able to initiate more digits flexion This date add light blue putty for gripping 10 reps 2 sets followed with rolling over putty for digit extension and soft tissue massage to volar hand 10 reps in between       Pulley's for shoulder flexion-needed mod verbal cueing 20 reps Patient had a hard time doing ABD 20 reps each - stop when feeling pull Switch patient to supine using a wand for active assisted range of motion for chest press into 80 degrees of shoulder flexion 3 sets of 10 with 1 pound weight in supine Active assisted range of motion for shoulder abduction by OT  Continue with 1 pound weight for supination pronation with hand supported as well as radial ulnar deviation and wrist flexion extension 12 reps Patient to continue with elbow  flexion and extension with 1 pound weight -active assisted range of motion to finish range.   Up and palm up position  pain and numbness and pins-and-needles decreasing in left hand as well as area of involvement smaller.              OT Education - 03/05/23 0835     Education Details progress and changes to HEP    Person(s) Educated Patient    Methods Explanation;Demonstration;Tactile cues;Verbal cues;Handout    Comprehension Verbal cues required;Returned demonstration;Verbalized understanding                 OT Long Term Goals - 02/07/23 1937  OT LONG TERM GOAL #1   Title Patient wife independent in home program to decrease edema and pain less 3-4/10 at rest show increased elbow, wrist and digit active into motion    Baseline Pain 6-9/10 from elbow down to digits as well as increased edema from elbow down to hand; decreased range of motion in elbow, wrist, forearm as well as to chest    Time 3    Period Weeks    Status New    Target Date 02/28/23      OT LONG TERM GOAL #2   Title L Elbow flexion and extension increased to within normal limits for patient able to initiate washing face and put hand through sleeve if    Baseline Elbow flexion and extention 2/5 - place and hold partial flexion - dropping hand cannot tolerate resistance for ext- pain 6-9/10 - unable to flex in pronation    Time 4    Period Weeks    Status New    Target Date 03/07/23      OT LONG TERM GOAL #3   Title L wrist  and forearm ROM and strength increase to 4-/5 to turn doorknob and push hand thru sleeve    Baseline Wrist in all planes decrease - pain  - and 3 to 4-/5 - in all planes- unable to use hand or wrist    Time 5    Period Weeks    Status New    Target Date 03/14/23      OT LONG TERM GOAL #4   Title L hand digits increase for pt to touch palm and digits extention WNL to donn gloves    Baseline decrease digits flexion MC's 60-70 and 2nd PIP 60; extnetion -30 to -40 MC's nad  -30 for PIP    Time 5    Period Weeks    Status New    Target Date 03/14/23      OT LONG TERM GOAL #5   Title L thumb AROM increase to WNL for pt to hold cup and turn doorknob    Baseline L thumb flexion decrease greatly -and edema , pain - PA and RA 35 - tight in webspace    Time 6    Period Weeks    Status New    Target Date 03/21/23                   Plan - 03/05/23 0835     Clinical Impression Statement Pt refer to OT with diagnosis of L shoulder anterior dislocation on 01/24/23. Pt is R hand dominant. Since [facility] pt made good progress in elbow , wrist AROM - cont to have edema , pain and stiffness in L hand - reinforce again for pt to wear his isotoner glove. In session pt show decrease edema, stiffness with use of contrast, soft tissue and PROM - add light blue putty for gripping today and rolling for digits extention . Pt cont to use 1 lbs for wrist and elbow - but decrease end range in all planes. Pt going without sling more per pt - He is  schedule for surgery 14th May L shoulder by DR Dewaine Conger and nerve conduction test was done yesterday.  Pt not taking any pain medication and show decreased pain  except radial wrist and hand - and with shoulder flexion and ABD AAROM/PROM.  Pt to cont with wrist splint to support wrist to increase ease for digits extention.  Pt can benefit from skilled  OT services to decrease edema, pain and increase AROM and strength in L UE to be able to use L hand in functional tasks- pt had CVA in past but had full motion and strength with diminshed sensation in thumb and 2nd digit    OT Occupational Profile and History Problem Focused Assessment - Including review of records relating to presenting problem    Occupational performance deficits (Please refer to evaluation for details): ADL's;IADL's;Rest and Sleep;Work;Play;Social Participation;Leisure    Body Structure / Function / Physical Skills ADL;Edema;Dexterity;Decreased knowledge of use of DME;Decreased  knowledge of precautions;Flexibility;ROM;UE functional use;FMC;Sensation;Pain;Strength;IADL;Proprioception;Coordination    Rehab Potential Fair    Clinical Decision Making Several treatment options, min-mod task modification necessary    Comorbidities Affecting Occupational Performance: May have comorbidities impacting occupational performance    Modification or Assistance to Complete Evaluation  Min-Moderate modification of tasks or assist with assess necessary to complete eval    OT Frequency 2x / week    OT Duration 8 weeks    OT Treatment/Interventions Self-care/ADL training;Moist Heat;Paraffin;Fluidtherapy;Contrast Bath;DME and/or AE instruction;Therapeutic exercise;Splinting;Patient/family education;Therapeutic activities;Manual Therapy;Passive range of motion    Consulted and Agree with Plan of Care Patient;Family member/caregiver             Patient will benefit from skilled therapeutic intervention in order to improve the following deficits and impairments:   Body Structure / Function / Physical Skills: ADL, Edema, Dexterity, Decreased knowledge of use of DME, Decreased knowledge of precautions, Flexibility, ROM, UE functional use, FMC, Sensation, Pain, Strength, IADL, Proprioception, Coordination       Visit Diagnosis: Nerve injury  Stiffness of left hand, not elsewhere classified  Stiffness of left wrist, not elsewhere classified  Stiffness of left elbow, not elsewhere classified  Pain in left arm  Muscle weakness (generalized)    Problem List Patient Active Problem List   Diagnosis Date Noted   Acute CVA (cerebrovascular accident) (HCC) 03/20/2021   HTN (hypertension) 03/20/2021   HLD (hyperlipidemia) 03/20/2021   Anemia 03/20/2021   Hypokalemia 03/20/2021    Oletta Cohn, OTR/L,CLT 03/05/2023, 2:48 PM  [facility] [address], [place], [address] Phone: [phone]   Fax:   [phone]  Name: Tara Maliszewski MRN: [id number] Date of Birth: 12/11/1955

## 2023-03-07 ENCOUNTER — Ambulatory Visit: Payer: Medicare Other | Admitting: Occupational Therapy

## 2023-03-07 DIAGNOSIS — M25642 Stiffness of left hand, not elsewhere classified: Secondary | ICD-10-CM

## 2023-03-07 DIAGNOSIS — M79602 Pain in left arm: Secondary | ICD-10-CM

## 2023-03-07 DIAGNOSIS — M25632 Stiffness of left wrist, not elsewhere classified: Secondary | ICD-10-CM

## 2023-03-07 DIAGNOSIS — M6281 Muscle weakness (generalized): Secondary | ICD-10-CM

## 2023-03-07 DIAGNOSIS — M25622 Stiffness of left elbow, not elsewhere classified: Secondary | ICD-10-CM

## 2023-03-07 DIAGNOSIS — T148XXA Other injury of unspecified body region, initial encounter: Secondary | ICD-10-CM | POA: Diagnosis not present

## 2023-03-07 NOTE — Therapy (Signed)
[facility] [address] [place], [address] Phone: [phone]   Fax:  [phone]  Occupational Therapy Treatment  Patient Details  Name: Jimmy Montoya MRN: [id number] Date of Birth: [birth year] Referring Provider (OT): Dr Joice Lofts   Encounter Date: 03/07/2023   OT End of Session - 03/07/23 0832     Visit Number 9    Number of Visits 16    Date for OT Re-Evaluation 04/04/23    OT Start Time 0820    OT Stop Time 0900    OT Time Calculation (min) 40 min    Activity Tolerance Patient tolerated treatment well    Behavior During Therapy [facility] for tasks assessed/performed             Past Medical History:  Diagnosis Date   Anemia 03/20/2021   Flu 09/29/2015   Hernia, inguinal, right 09/02/2015   Hyperlipemia    Hypertension    Kidney stone     Past Surgical History:  Procedure Laterality Date   HERNIA REPAIR     INGUINAL HERNIA REPAIR Right 12/23/2015   Procedure: HERNIA REPAIR INGUINAL ADULT;  Surgeon: Nadeen Landau, MD;  Location: [facility] ORS;  Service: General;  Laterality: Right;    There were no vitals filed for this visit.   Subjective Assessment - 03/07/23 0832     Subjective  Wearing my glove some - hand still swelling at times and get stiff- Using my key grip some but weak  but cannot use it really in dressing and eating- feel the numbness little better in fingers    Pertinent History 02/01/23 Ortho note with DR Poggi- Jimmy Montoya is a 67 y.o. male who presents for evaluation and treatment of a left shoulder injury which occurred 1 week ago. Apparently he was leaving a horse while holding onto the rains when the horse suddenly reared causing his shoulder to dislocate. He presented to the emergency room where the shoulder was reduced under conscious sedation. The patient notes that his shoulder is feeling reasonably comfortable but he has a lot of pain in the upper arm extending to the elbow region. He also  notes numbness and tingling as well as pain extending from his elbow down to his wrist. He has been wearing his shoulder immobilizer on a regular basis, removing it for bathing purposes. He rates his pain at 8/10 on today's visit and has been taking naproxen and oxycodone as necessary with temporary partial relief of his symptoms. He is right-hand dominant. He denies any prior dislocation or other injury to the left shoulder in the past.The patient is quite concerned by the neurologic deficits he continues to experience after his acute shoulder dislocation. It does appear that he has some deficits, primarily involving the radial and ulnar nerve distributions, although he also appears to have some deficits with the musculocutaneous and median nerves as well. Therefore, I have recommended that he undergo an EMG/NCV study of the left upper extremity to assess these nerves for evidence of damage and reinnervation. In addition, he will be started in occupational therapy to work on range of motion and strength exercises to the left upper extremity. Finally, he will be sent for an MRI scan of the left shoulder to evaluate for rotator cuff tear. Meanwhile, the patient will continue to wear his shoulder immobilizer on a regular basis, removing it for bathing purposes and for exercises. He may continue his present medication regimen, but is encouraged to wean off  of his oxycodone as soon as possible.    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    Currently in Pain? Yes    Pain Score 3     Pain Location --   radial forearm   Pain Orientation Left    Pain Descriptors / Indicators Aching;Tightness    Pain Type Acute pain    Pain Onset 1 to 4 weeks ago                [facility] OT Assessment - 03/07/23 0001       Strength   Right Hand Grip (lbs) 76    Right Hand Lateral Pinch 19 lbs    Right Hand 3 Point Pinch 15 lbs    Left Hand  Grip (lbs) 8    Left Hand Lateral Pinch 2 lbs    Left Hand 3 Point Pinch 1 lbs                      OT Treatments/Exercises (OP) - 03/07/23 0001       LUE Contrast Bath   Time 8 minutes    Comments decrease edema - stiffness            Some soft tissue massage done to webspace with palmar/ radial abduction of thumb as well as metacarpal spreads after some MLD techniques to decrease edema in left hand and wrist   Active assisted range of motion for palmar radial abduction as well as wrist extension stretch composite prior to attempts of tapping digit extension. Patient with increased digit extension this date  Digits composite flexion PROM - touching palm - 3rd thru 5th could do place and hold - touching palm  remind patient to do wrist extension stretches prior to attempts of digit extension and tapping. Patient to continue with a wrist brace to be able to do digit extension to release objects without flexing the wrist. Wear glove with it for compression  Cont  light blue putty for gripping 10 reps 2 sets followed with rolling over putty for digit extension and soft tissue massage to volar hand 10 reps in between Add and review Lat pinch and 3 point pinch - light blue putty 12 reps       Pt to cont with Pulley's for shoulder flexion-needed mod verbal cueing 20 reps Patient had a hard time doing ABD 20 reps each - stop when feeling pull supine using a wand for active assisted range of motion for chest press into 80 degrees of shoulder flexion 3 sets of 10 with 1 pound weight in supine Until surgery next week   Continue with 1 pound weight for supination pronation with hand supported as well as radial ulnar deviation and wrist flexion extension 12 reps Patient to continue with elbow flexion and extension with 1 pound weight -active assisted range of motion to finish range.   Up and palm up position  pain and numbness and pins-and-needles decreasing in left hand as  well as area of involvement smaller.  Pt focus on hand and digits until surgery next week             OT Education - 03/07/23 0832     Education Details progress and changes to HEP    Person(s) Educated Patient    Methods Explanation;Demonstration;Tactile cues;Verbal cues;Handout    Comprehension Verbal cues required;Returned demonstration;Verbalized  understanding                 OT Long Term Goals - 02/07/23 1937       OT LONG TERM GOAL #1   Title Patient wife independent in home program to decrease edema and pain less 3-4/10 at rest show increased elbow, wrist and digit active into motion    Baseline Pain 6-9/10 from elbow down to digits as well as increased edema from elbow down to hand; decreased range of motion in elbow, wrist, forearm as well as to chest    Time 3    Period Weeks    Status New    Target Date 02/28/23      OT LONG TERM GOAL #2   Title L Elbow flexion and extension increased to within normal limits for patient able to initiate washing face and put hand through sleeve if    Baseline Elbow flexion and extention 2/5 - place and hold partial flexion - dropping hand cannot tolerate resistance for ext- pain 6-9/10 - unable to flex in pronation    Time 4    Period Weeks    Status New    Target Date 03/07/23      OT LONG TERM GOAL #3   Title L wrist  and forearm ROM and strength increase to 4-/5 to turn doorknob and push hand thru sleeve    Baseline Wrist in all planes decrease - pain  - and 3 to 4-/5 - in all planes- unable to use hand or wrist    Time 5    Period Weeks    Status New    Target Date 03/14/23      OT LONG TERM GOAL #4   Title L hand digits increase for pt to touch palm and digits extention WNL to donn gloves    Baseline decrease digits flexion MC's 60-70 and 2nd PIP 60; extnetion -30 to -40 MC's nad -30 for PIP    Time 5    Period Weeks    Status New    Target Date 03/14/23      OT LONG TERM GOAL #5   Title L thumb AROM  increase to WNL for pt to hold cup and turn doorknob    Baseline L thumb flexion decrease greatly -and edema , pain - PA and RA 35 - tight in webspace    Time 6    Period Weeks    Status New    Target Date 03/21/23                   Plan - 03/07/23 9147     Clinical Impression Statement Pt refer to OT with diagnosis of L shoulder anterior dislocation on 01/24/23. Pt is R hand dominant. Since [facility] pt made good progress in elbow , wrist AROM - pain improving  - cont to have edema , pain and stiffness in L hand but this date was able to place and hold in fist touching palm with 3rd thru 5th -  reinforce again for pt to wear his isotoner glove, Massage and PROM composite fist. In session pt show decrease edema, stiffness with use of contrast, soft tissue and PROM - cont light blue putty for gripping today but also lat and 3 point with  rolling for digits extention inbetween. Pt cont to use 1 lbs for wrist and elbow - but decrease end range in all planes. Pt going without sling more per pt - He is  schedule for surgery 14th May L shoulder by DR Dewaine Conger and nerve conduction test was done yesterday.  Pt not taking any pain medication and show decreased pain. Pt to cont with shoulder flexion and ABD AAROM/PROM.  Pt to cont with wrist splint to support wrist to increase ease for digits extention. Pt to contact me after surgery about cont OT for elbow distally to hand.  Pt can benefit from skilled OT services to decrease edema, pain and increase AROM and strength in L UE to be able to use L hand in functional tasks- pt had CVA in past but had full motion and strength with diminshed sensation in thumb and 2nd digit    OT Occupational Profile and History Problem Focused Assessment - Including review of records relating to presenting problem    Occupational performance deficits (Please refer to evaluation for details): ADL's;IADL's;Rest and Sleep;Work;Play;Social Participation;Leisure    Body Structure /  Function / Physical Skills ADL;Edema;Dexterity;Decreased knowledge of use of DME;Decreased knowledge of precautions;Flexibility;ROM;UE functional use;FMC;Sensation;Pain;Strength;IADL;Proprioception;Coordination    Rehab Potential Fair    Clinical Decision Making Several treatment options, min-mod task modification necessary    Comorbidities Affecting Occupational Performance: May have comorbidities impacting occupational performance    Modification or Assistance to Complete Evaluation  Min-Moderate modification of tasks or assist with assess necessary to complete eval    OT Frequency 2x / week    OT Duration 8 weeks    OT Treatment/Interventions Self-care/ADL training;Moist Heat;Paraffin;Fluidtherapy;Contrast Bath;DME and/or AE instruction;Therapeutic exercise;Splinting;Patient/family education;Therapeutic activities;Manual Therapy;Passive range of motion    Consulted and Agree with Plan of Care Patient;Family member/caregiver             Patient will benefit from skilled therapeutic intervention in order to improve the following deficits and impairments:   Body Structure / Function / Physical Skills: ADL, Edema, Dexterity, Decreased knowledge of use of DME, Decreased knowledge of precautions, Flexibility, ROM, UE functional use, FMC, Sensation, Pain, Strength, IADL, Proprioception, Coordination       Visit Diagnosis: Nerve injury  Stiffness of left wrist, not elsewhere classified  Stiffness of left elbow, not elsewhere classified  Pain in left arm  Stiffness of left hand, not elsewhere classified  Muscle weakness (generalized)    Problem List Patient Active Problem List   Diagnosis Date Noted   Acute CVA (cerebrovascular accident) (HCC) 03/20/2021   HTN (hypertension) 03/20/2021   HLD (hyperlipidemia) 03/20/2021   Anemia 03/20/2021   Hypokalemia 03/20/2021    Oletta Cohn, OTR/L,CLT 03/07/2023, 12:23 PM  [facility] [address], [place], [address] Phone: [phone]   Fax:  [phone]  Name: Kriston Boker MRN: [id number] Date of Birth: 10/22/1956

## 2023-03-14 ENCOUNTER — Ambulatory Visit: Payer: Medicare Other

## 2023-03-14 ENCOUNTER — Encounter: Payer: Medicare Other | Admitting: Occupational Therapy

## 2023-03-14 DIAGNOSIS — M25512 Pain in left shoulder: Secondary | ICD-10-CM

## 2023-03-14 DIAGNOSIS — M6281 Muscle weakness (generalized): Secondary | ICD-10-CM

## 2023-03-14 DIAGNOSIS — T148XXA Other injury of unspecified body region, initial encounter: Secondary | ICD-10-CM | POA: Diagnosis not present

## 2023-03-14 DIAGNOSIS — M25612 Stiffness of left shoulder, not elsewhere classified: Secondary | ICD-10-CM

## 2023-03-14 NOTE — Therapy (Signed)
OUTPATIENT PHYSICAL THERAPY SHOULDER EVALUATION   Patient Name: Jimmy Montoya MRN: [id number] DOB:03/01/1956, 67 y.o., male Today's Date: 03/14/2023  END OF SESSION:  PT End of Session - 03/14/23 1348     Visit Number 1    Authorization Type [employer]    PT Start Time 1347    PT Stop Time 1429    PT Time Calculation (min) 42 min    Activity Tolerance Patient tolerated treatment well    Behavior During Therapy [facility] for tasks assessed/performed             Past Medical History:  Diagnosis Date   Anemia 03/20/2021   Flu 09/29/2015   Hernia, inguinal, right 09/02/2015   Hyperlipemia    Hypertension    Kidney stone    Past Surgical History:  Procedure Laterality Date   HERNIA REPAIR     INGUINAL HERNIA REPAIR Right 12/23/2015   Procedure: HERNIA REPAIR INGUINAL ADULT;  Surgeon: Nadeen Landau, MD;  Location: [facility] ORS;  Service: General;  Laterality: Right;   Patient Active Problem List   Diagnosis Date Noted   Acute CVA (cerebrovascular accident) (HCC) 03/20/2021   HTN (hypertension) 03/20/2021   HLD (hyperlipidemia) 03/20/2021   Anemia 03/20/2021   Hypokalemia 03/20/2021    PCP: Lynnea Ferrier, MD  REFERRING PROVIDER: Massie Bougie, MD   REFERRING DIAG: s/p L shoulder arthroscopy, RC repair   THERAPY DIAG:  Acute pain of left shoulder  Stiffness of left shoulder, not elsewhere classified  Muscle weakness (generalized)  Rationale for Evaluation and Treatment: Rehabilitation  ONSET DATE: sx 03/12/23, a 2 months ago, was pulled by a horse   SUBJECTIVE:                                                                                                                                                                                      SUBJECTIVE STATEMENT: Patient was pulled by a horse around a month ago and injured shoulder and hand. Had been working with OT to improve function of the L hand which has been improving. Had L rotator cuff  repair on 03/12/23  Hand dominance: Right  PERTINENT HISTORY: Per Dr. Binnie Rail note (02/01/23), L shoulder injury occurred 1 week prior while holding reigns of a horse and horse suddenly reared causing L shoulder to dislocate which was reduced under conscious sedation. Underwent EMG/NVC and MRI. MRI found L rotator cuff tear. S/p L rotator cuff repair and biceps tenodesis on 03/12/23.   PAIN:  Are you having pain? Yes: NPRS scale: 3/10 Pain location: L shoulder  Pain description: nagging  Aggravating factors: n/a  Relieving factors: ice, relax  PRECAUTIONS: Shoulder  WEIGHT BEARING RESTRICTIONS: Yes see protocol   FALLS:  Has patient fallen in last 6 months? No  LIVING ENVIRONMENT: Lives with: lives with their spouse Lives in: House/apartment Stairs: No Has following equipment at home: None  OCCUPATION: Works in collision work   PLOF: Independent  PATIENT GOALS:get shoulder well   NEXT MD VISIT: 03/27/23  OBJECTIVE:   DIAGNOSTIC FINDINGS:  Unable to obtain due to out of system imaging   PATIENT SURVEYS:  FOTO 34    COGNITION: Overall cognitive status: Within functional limits for tasks assessed     SENSATION: Reports tingling in fingers   POSTURE: WFL   UPPER EXTREMITY ROM:   Passive ROM Right eval Left eval  Shoulder flexion  29  Shoulder extension    Shoulder abduction  35  Shoulder adduction    Shoulder internal rotation  40 at 30 deg abduction  Shoulder external rotation  5  (Blank rows = not tested)  UPPER EXTREMITY MMT: Unable to assess due to protocol   MMT Right eval Left eval  Shoulder flexion    Shoulder extension    Shoulder abduction    Shoulder adduction    Shoulder internal rotation    Shoulder external rotation    Middle trapezius    Lower trapezius    Elbow flexion    Elbow extension    Wrist flexion    Wrist extension    Wrist ulnar deviation    Wrist radial deviation    Wrist pronation    Wrist supination    Grip  strength (lbs)    (Blank rows = not tested)   JOINT MOBILITY TESTING:  Unable to assess due to protocol  PALPATION:  Bandaging surrounding shoulder. Unable to assess   TODAY'S TREATMENT:                                                                                                                                         DATE: 03/14/23    PATIENT EDUCATION: Education details: HEP, goals, POC Person educated: Patient Education method: Explanation, Demonstration, and Handouts Education comprehension: verbalized understanding and returned demonstration  HOME EXERCISE PROGRAM: Access Code: [email or web address] URL: [email or web address] Date: 03/14/2023 Prepared by:   Exercises - Circular Shoulder Pendulum with Table Support  - 2 x daily - 7 x weekly - 10 reps - Ball Squeeze With Shoulder Sling  - 2 x daily - 7 x weekly - 2 sets - 10 reps - Wrist Flexion and Extension With Shoulder Sling  - 2 x daily - 7 x weekly - 2 sets - 10 reps - Closing and Opening Hand With Shoulder Sling  - 2 x daily - 7 x weekly - 2 sets - 10 reps - Forearm Pronation and Supination With Shoulder Sling  - 2 x daily - 7 x weekly - 2 sets - 10 reps  ASSESSMENT:  CLINICAL IMPRESSION: Patient  is a 67 y.o. male who was seen today for physical therapy evaluation and treatment for s/p L rotator cuff repair and biceps tenodesis. Patient demonstrates decreased L shoulder ROM, weakness in shoulder/elbow/wrist/hand, and impaired functional use of L UE at this time. Following protocol, patient with severely restricted ROM at this time despite PROM completed at initial eval. Patient is typically active and wants to get back to work and full use of L UE. Patient will benefit from skilled therapy to address deficits in order to improve quality of life and return to PLOF while following protocol provided by MD.   OBJECTIVE IMPAIRMENTS: decreased activity tolerance, decreased mobility, decreased ROM, decreased strength,  hypomobility, impaired flexibility, impaired sensation, impaired UE functional use, improper body mechanics, and pain.   ACTIVITY LIMITATIONS: carrying, lifting, bathing, toileting, dressing, reach over head, and hygiene/grooming  PARTICIPATION LIMITATIONS: meal prep, cleaning, laundry, driving, community activity, occupation, and yard work  PERSONAL FACTORS: Age, Fitness, Profession, and Time since onset of injury/illness/exacerbation are also affecting patient's functional outcome.   REHAB POTENTIAL: Good  CLINICAL DECISION MAKING: Stable/uncomplicated  EVALUATION COMPLEXITY: Low   GOALS: Goals reviewed with patient? Yes  SHORT TERM GOALS: Target date: 04/11/2023  Patient will be independent in home exercise program to improve strength/mobility for better functional independence with ADLs. Baseline: 5/16: HEP given Goal status: INITIAL 2. Patient will demonstrate knowledge of how to correctly don and doff left shoulder brace in order to be able to do it after surgery.   Baseline:   Goal status: INITIAL   LONG TERM GOALS: Target date: 06/06/2023   Patient will increase FOTO score to equal to or greater than 50 to demonstrate statistically significant improvement in mobility and quality of life. Baseline: 5/16: Goal status: INITIAL  2.  Patient will show symmetrical strength between left shoulder with right shoulder to regain ability to perform reaching tasks for his job  Baseline: see above Goal status: INITIAL  3.  Patient will improve AROM UE so they are able to perform typical job tasks and overhead ADL's such as reaching into cabinets. Baseline: see above Goal status: INITIAL  4.  Patient will report a worst pain of 3/10 on NPRS in to improve tolerance with ADLs and reduced symptoms with activities. Baseline: see above Goal status: INITIAL    PLAN:  PT FREQUENCY: 1-2x/week  PT DURATION: 12 weeks  PLANNED INTERVENTIONS: Therapeutic exercises, Therapeutic  activity, Neuromuscular re-education, Balance training, Gait training, Patient/Family education, Self Care, Joint mobilization, Joint manipulation, Cryotherapy, Moist heat, and Manual therapy  PLAN FOR NEXT SESSION: HEP review, PROM, follow protocol   Maylon Peppers, PT, DPT Physical Therapist - [facility]  03/14/2023, 1:58 PM

## 2023-03-18 ENCOUNTER — Ambulatory Visit: Payer: Medicare Other | Admitting: Physical Therapy

## 2023-03-18 ENCOUNTER — Ambulatory Visit: Payer: Medicare Other | Admitting: Occupational Therapy

## 2023-03-18 DIAGNOSIS — M25512 Pain in left shoulder: Secondary | ICD-10-CM

## 2023-03-18 DIAGNOSIS — T148XXA Other injury of unspecified body region, initial encounter: Secondary | ICD-10-CM | POA: Diagnosis not present

## 2023-03-18 DIAGNOSIS — M6281 Muscle weakness (generalized): Secondary | ICD-10-CM

## 2023-03-18 DIAGNOSIS — M25612 Stiffness of left shoulder, not elsewhere classified: Secondary | ICD-10-CM

## 2023-03-18 NOTE — Therapy (Signed)
OUTPATIENT PHYSICAL THERAPY TREATMENT NOTE   Patient Name: Jimmy Montoya MRN: [id number] DOB:1956-08-30, 67 y.o., male Today's Date: 03/18/2023  PCP: Dr. Melody Cirrincione Nones  REFERRING PROVIDER: Dr. Massie Bougie   END OF SESSION:   PT End of Session - 03/18/23 1307     Visit Number 2    Number of Visits 24    Date for PT Re-Evaluation 06/06/23    Authorization Type [employer]    Authorization - Visit Number 2    Authorization - Number of Visits 20    Progress Note Due on Visit 10    PT Start Time 1305    PT Stop Time 1345    PT Time Calculation (min) 40 min    Equipment Utilized During Treatment Other (comment)  L shoulder immobilizer with abduction pillow   Activity Tolerance Patient tolerated treatment well    Behavior During Therapy [facility] for tasks assessed/performed             Past Medical History:  Diagnosis Date   Anemia 03/20/2021   Flu 09/29/2015   Hernia, inguinal, right 09/02/2015   Hyperlipemia    Hypertension    Kidney stone    Past Surgical History:  Procedure Laterality Date   HERNIA REPAIR     INGUINAL HERNIA REPAIR Right 12/23/2015   Procedure: HERNIA REPAIR INGUINAL ADULT;  Surgeon: Nadeen Landau, MD;  Location: [facility] ORS;  Service: General;  Laterality: Right;   Patient Active Problem List   Diagnosis Date Noted   Acute CVA (cerebrovascular accident) (HCC) 03/20/2021   HTN (hypertension) 03/20/2021   HLD (hyperlipidemia) 03/20/2021   Anemia 03/20/2021   Hypokalemia 03/20/2021    REFERRING DIAG: s/p L shoulder arthroscopy, RC repair and biceps tenodesis   THERAPY DIAG:  Acute pain of left shoulder  Stiffness of left shoulder, not elsewhere classified  Muscle weakness (generalized)  Rationale for Evaluation and Treatment Rehabilitation  PERTINENT HISTORY: Per Dr. Binnie Rail note (02/01/23), L shoulder injury occurred 1 week prior while holding reigns of a horse and horse suddenly reared causing L shoulder to dislocate which was  reduced under conscious sedation. Underwent EMG/NVC and MRI. MRI found L rotator cuff tear. S/p L rotator cuff repair and biceps tenodesis on 03/12/23.   PRECAUTIONS: No AROM of the elbow or shoulder                              No Shoulder ER beyond 40 deg                              No Shoulder Ext or horizontal abduction past neutral                             No lifting objects                              Place a towel roll or pillow under elbow while laying supine to avoid shoulder ext                             No friction massage of surgical site    SUBJECTIVE:  SUBJECTIVE STATEMENT:  Pt reports that exercises have been going well and he is able to control his pain with pain medication.    PAIN:  Are you having pain? Yes: NPRS scale: 1/10 Pain location: Left anterior shoulder  Pain description: Achy  Aggravating factors: Moving shoulder  Relieving factors: Tylenol    OBJECTIVE: (objective measures completed at initial evaluation unless otherwise dated)   DIAGNOSTIC FINDINGS:  Unable to obtain due to out of system imaging    PATIENT SURVEYS:  FOTO 34     COGNITION: Overall cognitive status: Within functional limits for tasks assessed                                  SENSATION: Reports tingling in fingers    POSTURE: WFL    UPPER EXTREMITY ROM:    Passive ROM Right eval Left eval  Shoulder flexion   29  Shoulder extension      Shoulder abduction   35  Shoulder adduction      Shoulder internal rotation   40 at 30 deg abduction  Shoulder external rotation   5  (Blank rows = not tested)   UPPER EXTREMITY MMT: Unable to assess due to protocol    MMT Right eval Left eval  Shoulder flexion      Shoulder extension      Shoulder abduction      Shoulder adduction      Shoulder  internal rotation      Shoulder external rotation      Middle trapezius      Lower trapezius      Elbow flexion      Elbow extension      Wrist flexion      Wrist extension      Wrist ulnar deviation      Wrist radial deviation      Wrist pronation      Wrist supination      Grip strength (lbs)      (Blank rows = not tested)     JOINT MOBILITY TESTING:  Unable to assess due to protocol   PALPATION:  Bandaging surrounding shoulder. Unable to assess             TODAY'S TREATMENT:                                                                                                                                         DATE:   03/18/23 Review of biceps tendinosis protocol with emphasis on precautions.    03/14/23       PATIENT EDUCATION: Education details: HEP, goals, POC Person educated: Patient Education method: Explanation, Demonstration, and Handouts Education comprehension: verbalized understanding and returned demonstration   HOME EXERCISE PROGRAM: Access Code: [email or web address] URL: [email or web address] Date: 03/14/2023 Prepared by:    Exercises - Circular  Shoulder Pendulum with Table Support  - 2 x daily - 7 x weekly - 10 reps - Ball Squeeze With Shoulder Sling  - 2 x daily - 7 x weekly - 2 sets - 10 reps - Wrist Flexion and Extension With Shoulder Sling  - 2 x daily - 7 x weekly - 2 sets - 10 reps - Closing and Opening Hand With Shoulder Sling  - 2 x daily - 7 x weekly - 2 sets - 10 reps - Forearm Pronation and Supination With Shoulder Sling  - 2 x daily - 7 x weekly - 2 sets - 10 reps   ASSESSMENT:   CLINICAL IMPRESSION: Pt presents for f/u s/p 1 week for L rotator cuff repair and biceps tenodesis. Session abridged given that pt is only able to complete a limited amount of exercises given precautions. He was educated on biceps tenodesis protocol and precautions of protocol. Pt verbally demonstrates understanding and he was able to perform scapular  retractions without difficulty. Patient will continue to  benefit from skilled therapy to address deficits in order to improve quality of life and return to PLOF while following protocol provided by MD.    OBJECTIVE IMPAIRMENTS: decreased activity tolerance, decreased mobility, decreased ROM, decreased strength, hypomobility, impaired flexibility, impaired sensation, impaired UE functional use, improper body mechanics, and pain.    ACTIVITY LIMITATIONS: carrying, lifting, bathing, toileting, dressing, reach over head, and hygiene/grooming   PARTICIPATION LIMITATIONS: meal prep, cleaning, laundry, driving, community activity, occupation, and yard work   PERSONAL FACTORS: Age, Fitness, Profession, and Time since onset of injury/illness/exacerbation are also affecting patient's functional outcome.    REHAB POTENTIAL: Good   CLINICAL DECISION MAKING: Stable/uncomplicated   EVALUATION COMPLEXITY: Low     GOALS: Goals reviewed with patient? Yes   SHORT TERM GOALS: Target date: 04/11/2023   Patient will be independent in home exercise program to improve strength/mobility for better functional independence with ADLs. Baseline: 5/16: HEP given Goal status: Ongoing   2. Patient will demonstrate knowledge of how to correctly don and doff left shoulder brace in order to be able to do it after surgery.             Baseline: NT 03/18/23: Pt demonstrates understanding             Goal status: Achieved      LONG TERM GOALS: Target date: 06/06/2023     Patient will increase FOTO score to equal to or greater than 50 to demonstrate statistically significant improvement in mobility and quality of life. Baseline: 5/16: NT  Goal status: Ongoing    2.  Patient will show symmetrical strength between left shoulder with right shoulder to regain ability to perform reaching tasks for his job  Baseline: see above Goal status: Ongoing    3.  Patient will improve AROM UE so they are able to perform typical  job tasks and overhead ADL's such as reaching into cabinets. Baseline: see above Goal status: Ongoing    4.  Patient will report a worst pain of 3/10 on NPRS in to improve tolerance with ADLs and reduced symptoms with activities. Baseline: see above Goal status: Ongoing        PLAN:   PT FREQUENCY: 1-2x/week   PT DURATION: 12 weeks   PLANNED INTERVENTIONS: Therapeutic exercises, Therapeutic activity, Neuromuscular re-education, Balance training, Gait training, Patient/Family education, Self Care, Joint mobilization, Joint manipulation, Cryotherapy, Moist heat, and Manual therapy   PLAN FOR NEXT SESSION: Measure PROM of right  shoulder, FOTO, Begin Intermediate Post Op phase listed on Bicep Tenodesis protocol. Precautions include No strengthening until full ROM and avoid long lever arm resistance for elbow flexion and supination.   Ellin Goodie PT, DPT  03/18/2023, 1:09 PM

## 2023-03-20 ENCOUNTER — Ambulatory Visit: Payer: Medicare Other | Admitting: Physical Therapy

## 2023-03-20 ENCOUNTER — Encounter: Payer: Self-pay | Admitting: Physical Therapy

## 2023-03-20 DIAGNOSIS — M25612 Stiffness of left shoulder, not elsewhere classified: Secondary | ICD-10-CM

## 2023-03-20 DIAGNOSIS — M25512 Pain in left shoulder: Secondary | ICD-10-CM

## 2023-03-20 DIAGNOSIS — T148XXA Other injury of unspecified body region, initial encounter: Secondary | ICD-10-CM | POA: Diagnosis not present

## 2023-03-20 NOTE — Therapy (Signed)
OUTPATIENT PHYSICAL THERAPY TREATMENT NOTE   Patient Name: Jimmy Montoya MRN: [id number] DOB:10/22/1956, 67 y.o., male Today's Date: 03/20/2023  PCP: Dr. Ladislao Cohenour Nones  REFERRING PROVIDER: Dr. Massie Bougie   END OF SESSION:   PT End of Session - 03/20/23 1635     Visit Number 3    Number of Visits 24    Date for PT Re-Evaluation 06/06/23    Authorization Type [employer]    Authorization - Visit Number 3    Authorization - Number of Visits 20    Progress Note Due on Visit 10    PT Start Time 1600    PT Stop Time 1645    PT Time Calculation (min) 45 min    Equipment Utilized During Treatment Other (comment)  L shoulder immobilizer with abduction pillow   Activity Tolerance Patient tolerated treatment well    Behavior During Therapy [facility] for tasks assessed/performed             Past Medical History:  Diagnosis Date   Anemia 03/20/2021   Flu 09/29/2015   Hernia, inguinal, right 09/02/2015   Hyperlipemia    Hypertension    Kidney stone    Past Surgical History:  Procedure Laterality Date   HERNIA REPAIR     INGUINAL HERNIA REPAIR Right 12/23/2015   Procedure: HERNIA REPAIR INGUINAL ADULT;  Surgeon: Nadeen Landau, MD;  Location: [facility] ORS;  Service: General;  Laterality: Right;   Patient Active Problem List   Diagnosis Date Noted   Acute CVA (cerebrovascular accident) (HCC) 03/20/2021   HTN (hypertension) 03/20/2021   HLD (hyperlipidemia) 03/20/2021   Anemia 03/20/2021   Hypokalemia 03/20/2021    REFERRING DIAG: s/p L shoulder arthroscopy, RC repair and biceps tenodesis   THERAPY DIAG:  Acute pain of left shoulder  Stiffness of left shoulder, not elsewhere classified  Rationale for Evaluation and Treatment Rehabilitation  PERTINENT HISTORY: Per Dr. Binnie Rail note (02/01/23), L shoulder injury occurred 1 week prior while holding reigns of a horse and horse suddenly reared causing L shoulder to dislocate which was reduced under conscious sedation.  Underwent EMG/NVC and MRI. MRI found L rotator cuff tear. S/p L rotator cuff repair and biceps tenodesis on 03/12/23.   PRECAUTIONS: No AROM of the elbow or shoulder                              No Shoulder ER beyond 40 deg                              No Shoulder Ext or horizontal abduction past neutral                             No lifting objects                              Place a towel roll or pillow under elbow while laying supine to avoid shoulder ext                             No friction massage of surgical site    SUBJECTIVE:  SUBJECTIVE STATEMENT:  Pt reports that he has been able to perform all exercises independently.   PAIN:  Are you having pain? Yes: NPRS scale: 1/10 Pain location: Left anterior shoulder  Pain description: Achy  Aggravating factors: Moving shoulder  Relieving factors: Tylenol    OBJECTIVE: (objective measures completed at initial evaluation unless otherwise dated)   DIAGNOSTIC FINDINGS:  Unable to obtain due to out of system imaging    PATIENT SURVEYS:  FOTO 34     COGNITION: Overall cognitive status: Within functional limits for tasks assessed                                  SENSATION: Reports tingling in fingers    POSTURE: WFL    UPPER EXTREMITY ROM:    Passive ROM Right eval Left eval  Shoulder flexion  170 65  Shoulder extension      Shoulder abduction  180 40  Shoulder adduction      Shoulder internal rotation   40 at 30 deg abduction  Shoulder external rotation 90  15  (Blank rows = not tested)   UPPER EXTREMITY MMT: Unable to assess due to protocol    MMT Right eval Left eval  Shoulder flexion      Shoulder extension      Shoulder abduction      Shoulder adduction      Shoulder internal rotation      Shoulder external rotation       Middle trapezius      Lower trapezius      Elbow flexion      Elbow extension      Wrist flexion      Wrist extension      Wrist ulnar deviation      Wrist radial deviation      Wrist pronation      Wrist supination      Grip strength (lbs)      (Blank rows = not tested)     JOINT MOBILITY TESTING:  Unable to assess due to protocol   PALPATION:  Bandaging surrounding shoulder. Unable to assess             TODAY'S TREATMENT:                                                                                                                                         DATE:   03/20/23: All exercises performed on LUE  Shoulder PROM  -Flex R/L 170/65 -Abd R/L 180/40  -ER at 0 deg abd R/L 90/15   Supine Shoulder Flex PROM 1 x 10 Supine Shoulder Abd PROM 1 x 10  Supine Shoulder IR/ER at 0 deg abd 1 x 10   Pendulums Forward/Backward 1 x 10  Pendulums Side to Side 1 x 10   03/18/23 Review of  biceps tendinosis protocol with emphasis on precautions.    03/14/23       PATIENT EDUCATION: Education details: HEP, goals, POC Person educated: Patient Education method: Explanation, Demonstration, and Handouts Education comprehension: verbalized understanding and returned demonstration   HOME EXERCISE PROGRAM: Access Code: [email or web address] URL: [email or web address] Date: 03/14/2023 Prepared by:    Exercises - Circular Shoulder Pendulum with Table Support  - 2 x daily - 7 x weekly - 10 reps - Ball Squeeze With Shoulder Sling  - 2 x daily - 7 x weekly - 2 sets - 10 reps - Wrist Flexion and Extension With Shoulder Sling  - 2 x daily - 7 x weekly - 2 sets - 10 reps - Closing and Opening Hand With Shoulder Sling  - 2 x daily - 7 x weekly - 2 sets - 10 reps - Forearm Pronation and Supination With Shoulder Sling  - 2 x daily - 7 x weekly - 2 sets - 10 reps   ASSESSMENT:   CLINICAL IMPRESSION: Pt presents for f/u s/p 1 week for L rotator cuff repair and biceps tenodesis. He  continues to show decreased left shoulder ROM that is expected at this point in rehab process. HEP modified to include PROM shoulder exercises that pt was able to perform independently with minimal cueing. Handout given to pt for him to reference. Patient will continue to  benefit from skilled therapy to address deficits in order to improve quality of life and return to PLOF while following protocol provided by MD.    OBJECTIVE IMPAIRMENTS: decreased activity tolerance, decreased mobility, decreased ROM, decreased strength, hypomobility, impaired flexibility, impaired sensation, impaired UE functional use, improper body mechanics, and pain.    ACTIVITY LIMITATIONS: carrying, lifting, bathing, toileting, dressing, reach over head, and hygiene/grooming   PARTICIPATION LIMITATIONS: meal prep, cleaning, laundry, driving, community activity, occupation, and yard work   PERSONAL FACTORS: Age, Fitness, Profession, and Time since onset of injury/illness/exacerbation are also affecting patient's functional outcome.    REHAB POTENTIAL: Good   CLINICAL DECISION MAKING: Stable/uncomplicated   EVALUATION COMPLEXITY: Low     GOALS: Goals reviewed with patient? Yes   SHORT TERM GOALS: Target date: 04/11/2023   Patient will be independent in home exercise program to improve strength/mobility for better functional independence with ADLs. Baseline: 5/16: HEP given Goal status: Ongoing   2. Patient will demonstrate knowledge of how to correctly don and doff left shoulder brace in order to be able to do it after surgery.             Baseline: NT 03/18/23: Pt demonstrates understanding             Goal status: Achieved      LONG TERM GOALS: Target date: 06/06/2023     Patient will increase FOTO score to equal to or greater than 50 to demonstrate statistically significant improvement in mobility and quality of life. Baseline: 5/16: 34 Goal status: Ongoing    2.  Patient will show symmetrical strength  between left shoulder with right shoulder to regain ability to perform reaching tasks for his job  Baseline: see above Goal status: Ongoing    3.  Patient will improve AROM UE so they are able to perform typical job tasks and overhead ADL's such as reaching into cabinets. Baseline: see above Goal status: Ongoing    4.  Patient will report a worst pain of 3/10 on NPRS in to improve tolerance with ADLs and reduced symptoms with activities. Baseline: see above Goal  status: Ongoing        PLAN:   PT FREQUENCY: 1-2x/week   PT DURATION: 12 weeks   PLANNED INTERVENTIONS: Therapeutic exercises, Therapeutic activity, Neuromuscular re-education, Balance training, Gait training, Patient/Family education, Self Care, Joint mobilization, Joint manipulation, Cryotherapy, Moist heat, and Manual therapy   PLAN FOR NEXT SESSION: Begin Phase II Therex on protocol: Begin AAROM in supine and grade I and II shoulder mobilizations.    Ellin Goodie PT, DPT  03/20/2023, 4:36 PM

## 2023-03-21 ENCOUNTER — Encounter: Payer: Medicare Other | Admitting: Physical Therapy

## 2023-03-21 ENCOUNTER — Ambulatory Visit: Payer: Medicare Other | Admitting: Occupational Therapy

## 2023-03-21 DIAGNOSIS — M25632 Stiffness of left wrist, not elsewhere classified: Secondary | ICD-10-CM

## 2023-03-21 DIAGNOSIS — M25642 Stiffness of left hand, not elsewhere classified: Secondary | ICD-10-CM

## 2023-03-21 DIAGNOSIS — M6281 Muscle weakness (generalized): Secondary | ICD-10-CM

## 2023-03-21 DIAGNOSIS — T148XXA Other injury of unspecified body region, initial encounter: Secondary | ICD-10-CM | POA: Diagnosis not present

## 2023-03-21 DIAGNOSIS — M79602 Pain in left arm: Secondary | ICD-10-CM

## 2023-03-21 NOTE — Therapy (Signed)
[facility] [address] [place], [address] Phone: [phone]   Fax:  [phone]  Occupational Therapy Recert  Patient Details  Name: Jimmy Montoya MRN: [id number] Date of Birth: 07/11/1956 Referring Provider (OT): Dr Dewaine Conger   Encounter Date: 03/21/2023   OT End of Session - 03/21/23 0955     Visit Number 10    Number of Visits 16    Date for OT Re-Evaluation 05/02/23    OT Start Time 0931    OT Stop Time 1013    OT Time Calculation (min) 42 min    Activity Tolerance Patient tolerated treatment well    Behavior During Therapy [facility] for tasks assessed/performed             Past Medical History:  Diagnosis Date   Anemia 03/20/2021   Flu 09/29/2015   Hernia, inguinal, right 09/02/2015   Hyperlipemia    Hypertension    Kidney stone     Past Surgical History:  Procedure Laterality Date   HERNIA REPAIR     INGUINAL HERNIA REPAIR Right 12/23/2015   Procedure: HERNIA REPAIR INGUINAL ADULT;  Surgeon: Nadeen Landau, MD;  Location: [facility] ORS;  Service: General;  Laterality: Right;    There were no vitals filed for this visit.   Subjective Assessment - 03/21/23 0952     Subjective  My shoulder is sore  but hand and wrist so much better- but I lost what I gain in the elbow- I trying to do the exercises for the shoulder - Did not sleep with glove- the fingers little swollen    Pertinent History 02/01/23 Ortho note with DR Poggi- Khaaliq Sharpley is a 67 y.o. male who presents for evaluation and treatment of a left shoulder injury which occurred 1 week ago. Apparently he was leaving a horse while holding onto the rains when the horse suddenly reared causing his shoulder to dislocate. He presented to the emergency room where the shoulder was reduced under conscious sedation. The patient notes that his shoulder is feeling reasonably comfortable but he has a lot of pain in the upper arm extending to the elbow region. He also  notes numbness and tingling as well as pain extending from his elbow down to his wrist. He has been wearing his shoulder immobilizer on a regular basis, removing it for bathing purposes. He rates his pain at 8/10 on today's visit and has been taking naproxen and oxycodone as necessary with temporary partial relief of his symptoms. He is right-hand dominant. He denies any prior dislocation or other injury to the left shoulder in the past.The patient is quite concerned by the neurologic deficits he continues to experience after his acute shoulder dislocation. It does appear that he has some deficits, primarily involving the radial and ulnar nerve distributions, although he also appears to have some deficits with the musculocutaneous and median nerves as well. Therefore, I have recommended that he undergo an EMG/NCV study of the left upper extremity to assess these nerves for evidence of damage and reinnervation. In addition, he will be started in occupational therapy to work on range of motion and strength exercises to the left upper extremity. Finally, he will be sent for an MRI scan of the left shoulder to evaluate for rotator cuff tear. Meanwhile, the patient will continue to wear his shoulder immobilizer on a regular basis, removing it for bathing purposes and for exercises. He may continue his present medication regimen, but is encouraged  to wean off of his oxycodone as soon as possible.    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    Currently in Pain? Yes    Pain Score 3     Pain Location --   Radial hand   Pain Orientation Left    Pain Descriptors / Indicators Numbness;Sore;Tightness    Pain Type Acute pain    Pain Onset 1 to 4 weeks ago    Pain Frequency Intermittent               [facility] OT Assessment - 03/21/23 0001       AROM   Left Wrist Extension 35 Degrees    Left Wrist Flexion 70 Degrees    Left Wrist  Radial Deviation 25 Degrees    Left Wrist Ulnar Deviation 30 Degrees      Left Hand AROM   L Thumb Radial ADduction/ABduction 0-55 38    L Thumb Palmar ADduction/ABduction 0-45 50    L Index  MCP 0-90 60 Degrees    L Index PIP 0-100 85 Degrees    L Long  MCP 0-90 60 Degrees    L Long PIP 0-100 90 Degrees    L Ring  MCP 0-90 60 Degrees    L Ring PIP 0-100 90 Degrees    L Little  MCP 0-90 60 Degrees    L Little PIP 0-100 95 Degrees                Reassess pt after surgery to shoulder 03/12/23 Pt maintain progress in sup/pro, RD, UD and digits extention Decrease wrist flexion, ext  and thumb , digits flexion  Increase edema cont in hand  Pt arrive with wrist splint on and in sling       OT Treatments/Exercises (OP) - 03/21/23 0001       LUE Contrast Bath   Time 8 minutes    Comments decrease edema and stiffness            PROM to [facility] and intrinsic fist - followed by composite12 reps Place and hold 12 WIth extention in between Thumb PA and RA PROM - 10 reps PROM for thumb flexion IP , MC and composite Followed by opposition with thumb RA and PA inbetween Can slide down 4th and 5th now  PROM for wrist flexion ,ext - with elbow supported all activities REINFORCE for pt to follow thru with PT HEP for shoulder and elbow daily  STOP doing prior to surgery HEP           OT Education - 03/21/23 0955     Education Details progress and changes to HEP    Person(s) Educated Patient    Methods Explanation;Demonstration;Tactile cues;Verbal cues;Handout    Comprehension Verbal cues required;Returned demonstration;Verbalized understanding                 OT Long Term Goals - 03/21/23 1020       OT LONG TERM GOAL #1   Title Patient wife independent in home program to decrease edema and pain less 3-4/10 at rest show increased elbow, wrist and digit active into motion    Baseline Pain 6-9/10 from elbow down to digits as well as increased edema from elbow  down to hand; decreased range of motion in elbow, wrist, forearm as well as to chest NOW pain decrease to 3/10 in radial  hand and wrist- increase motion in forearm, wrist and hand  - pt had surgery 03/12/23 seen by PT for shoulder and elbow    Status Achieved      OT LONG TERM GOAL #2   Title L Elbow flexion and extension increased to within normal limits for patient able to initiate washing face and put hand through sleeve if    Baseline Had surgery to shoulder and bicep- PT adressing shoulder and elbow    Status Deferred      OT LONG TERM GOAL #3   Title L wrist  and forearm ROM and strength increase to 4-/5 to turn doorknob and push hand thru sleeve    Baseline Wrist in all planes decrease - pain  - and 3 to 4-/5 - in all planes- unable to use hand or wrist  NOW pain decrease wrist RD, UD WNL , sup/pro WNL but decrease wrist flexion and extention - tight and pain with PROM    Time 5    Period Weeks    Status On-going    Target Date 04/25/23      OT LONG TERM GOAL #4   Title L hand digits increase for pt to touch palm and digits extention WNL to donn gloves    Baseline decrease digits flexion MC's 60-70 and 2nd PIP 60; extnetion -30 to -40 MC's nad -30 for PIP  NOW pt able to get full extention of digits, flexion decrease to 60's at [facility] flexion , and PIP's 85-95 -    Time 5    Period Weeks    Status On-going    Target Date 04/25/23      OT LONG TERM GOAL #5   Title L thumb AROM increase to WNL for pt to hold cup and turn doorknob    Baseline L thumb flexion decrease greatly -and edema , pain - PA and RA 35 - tight in webspace  NOW numbness still in radial hand and wrist- PA WNL 50 but RA decrease to 35 and tight - 3/10 pain - decrease opposition to 5th DIP    Time 6    Period Weeks    Status On-going    Target Date 05/02/23                   Plan - 03/21/23 0957     Clinical Impression Statement Pt was refer to OT with diagnosis of L shoulder anterior dislocation on  01/24/23. Pt is R hand dominant. Pt was seen for 9 visits -and return today after having shoulder and bicep surgery 03/12/23 by DR Dewaine Conger. Prior to surgery pt made great progress  in elbow, forearm, wrist  and digits AROM - pain improved from 7-9/10 to 3/10 -only now more numbness on dorsal radial hand and wrist. Pt cont to have  edema , pain and stiffness in L hand with digits  and thumb -  and wrist with flexion and extention. Reinforce again for pt to wear his isotoner glove, Massage and PROM composite fist. In session pt show decrease edema, stiffness with use of contrast, soft tissue and PROM. Pt to cont with wrist splint but take off and on alternating 2 hrs. Pt seen PT for HEP for PROM to shoulder and elbow- reinforce for pt to do them daily as advice. ? if doing correctly.   Pt can benefit from skilled OT services to decrease edema, pain and increase AROM and strength in L wrist and hand to be able to  use L hand in functional tasks- pt had CVA in past but had full motion and strength with diminshed sensation in thumb and 2nd digit    OT Occupational Profile and History Problem Focused Assessment - Including review of records relating to presenting problem    Occupational performance deficits (Please refer to evaluation for details): ADL's;IADL's;Rest and Sleep;Work;Play;Social Participation;Leisure    Body Structure / Function / Physical Skills ADL;Edema;Dexterity;Decreased knowledge of use of DME;Decreased knowledge of precautions;Flexibility;ROM;UE functional use;FMC;Sensation;Pain;Strength;IADL;Proprioception;Coordination    Rehab Potential Fair    Clinical Decision Making Several treatment options, min-mod task modification necessary    Comorbidities Affecting Occupational Performance: May have comorbidities impacting occupational performance    Modification or Assistance to Complete Evaluation  Min-Moderate modification of tasks or assist with assess necessary to complete eval    OT Frequency 2x /  week    OT Duration 8 weeks    OT Treatment/Interventions Self-care/ADL training;Moist Heat;Paraffin;Fluidtherapy;Contrast Bath;DME and/or AE instruction;Therapeutic exercise;Splinting;Patient/family education;Therapeutic activities;Manual Therapy;Passive range of motion    Consulted and Agree with Plan of Care Patient;Family member/caregiver             Patient will benefit from skilled therapeutic intervention in order to improve the following deficits and impairments:   Body Structure / Function / Physical Skills: ADL, Edema, Dexterity, Decreased knowledge of use of DME, Decreased knowledge of precautions, Flexibility, ROM, UE functional use, FMC, Sensation, Pain, Strength, IADL, Proprioception, Coordination       Visit Diagnosis: Nerve injury  Stiffness of left wrist, not elsewhere classified  Stiffness of left hand, not elsewhere classified  Pain in left arm  Muscle weakness (generalized)    Problem List Patient Active Problem List   Diagnosis Date Noted   Acute CVA (cerebrovascular accident) (HCC) 03/20/2021   HTN (hypertension) 03/20/2021   HLD (hyperlipidemia) 03/20/2021   Anemia 03/20/2021   Hypokalemia 03/20/2021    Oletta Cohn, OTR/L,CLT 03/21/2023, 10:34 AM  [facility] [address], [place], [address] Phone: [phone]   Fax:  [phone]  Name: Tarez Cardello MRN: [id number] Date of Birth: May 31, 1956

## 2023-03-21 NOTE — Addendum Note (Signed)
Addended by: Oletta Cohn on: 03/21/2023 09:30 PM   Modules accepted: Orders

## 2023-03-26 ENCOUNTER — Encounter: Payer: Medicare Other | Admitting: Physical Therapy

## 2023-03-27 ENCOUNTER — Telehealth (HOSPITAL_COMMUNITY): Payer: Self-pay | Admitting: Emergency Medicine

## 2023-03-27 ENCOUNTER — Other Ambulatory Visit: Payer: Self-pay | Admitting: Internal Medicine

## 2023-03-27 DIAGNOSIS — I2089 Other forms of angina pectoris: Secondary | ICD-10-CM

## 2023-03-27 NOTE — Telephone Encounter (Signed)
Attempted to call patient regarding upcoming cardiac CT appointment. °Left message on voicemail with name and callback number °Raevon Broom RN Navigator Cardiac Imaging °[facility] °[phone] Office °[phone] Cell ° °

## 2023-03-28 ENCOUNTER — Ambulatory Visit
Admission: RE | Admit: 2023-03-28 | Discharge: 2023-03-28 | Disposition: A | Discharge status: Home / Self Care | Payer: Medicare Other | Source: Ambulatory Visit | Attending: Internal Medicine | Admitting: Internal Medicine

## 2023-03-28 ENCOUNTER — Ambulatory Visit: Payer: Medicare Other | Admitting: Occupational Therapy

## 2023-03-28 ENCOUNTER — Encounter: Payer: Medicare Other | Admitting: Physical Therapy

## 2023-03-28 DIAGNOSIS — M6281 Muscle weakness (generalized): Secondary | ICD-10-CM | POA: Diagnosis not present

## 2023-03-28 DIAGNOSIS — M25632 Stiffness of left wrist, not elsewhere classified: Secondary | ICD-10-CM

## 2023-03-28 DIAGNOSIS — M79602 Pain in left arm: Secondary | ICD-10-CM

## 2023-03-28 DIAGNOSIS — M25642 Stiffness of left hand, not elsewhere classified: Secondary | ICD-10-CM

## 2023-03-28 DIAGNOSIS — T148XXA Other injury of unspecified body region, initial encounter: Secondary | ICD-10-CM

## 2023-03-28 DIAGNOSIS — I2089 Other forms of angina pectoris: Secondary | ICD-10-CM | POA: Insufficient documentation

## 2023-03-28 MED ORDER — NITROGLYCERIN 0.4 MG SL SUBL
0.8000 mg | SUBLINGUAL_TABLET | Freq: Once | SUBLINGUAL | Status: AC
  Administered 2023-03-28: 0.8 mg via SUBLINGUAL

## 2023-03-28 MED ORDER — IOHEXOL 350 MG/ML SOLN
75.0000 mL | Freq: Once | INTRAVENOUS | Status: AC | PRN
  Administered 2023-03-28: 75 mL via INTRAVENOUS

## 2023-03-28 MED ORDER — METOPROLOL TARTRATE 5 MG/5ML IV SOLN
10.0000 mg | Freq: Once | INTRAVENOUS | Status: AC
  Administered 2023-03-28: 10 mg via INTRAVENOUS

## 2023-03-28 NOTE — Therapy (Signed)
[facility] [address] [place], [address] Phone: [phone]   Fax:  [phone]  Occupational Therapy Treatment  Patient Details  Name: Jimmy Montoya MRN: [id number] Date of Birth: [birth year] Referring Provider (OT): Dr Joice Lofts   Encounter Date: 03/28/2023   OT End of Session - 03/28/23 0903     Visit Number 11    Number of Visits 16    Date for OT Re-Evaluation 05/02/23    OT Start Time 0903    OT Stop Time 0945    OT Time Calculation (min) 42 min    Activity Tolerance Patient tolerated treatment well    Behavior During Therapy [facility] for tasks assessed/performed             Past Medical History:  Diagnosis Date   Anemia 03/20/2021   Flu 09/29/2015   Hernia, inguinal, right 09/02/2015   Hyperlipemia    Hypertension    Kidney stone     Past Surgical History:  Procedure Laterality Date   HERNIA REPAIR     INGUINAL HERNIA REPAIR Right 12/23/2015   Procedure: HERNIA REPAIR INGUINAL ADULT;  Surgeon: Nadeen Landau, MD;  Location: [facility] ORS;  Service: General;  Laterality: Right;    There were no vitals filed for this visit.   Subjective Assessment - 03/28/23 0902     Subjective  I did use the glove and can tell difference in the swelling-but not last night - top of hand some cold feeling and tingling- My mom gave me this hand gripper I am doing at night    Pertinent History 02/01/23 Ortho note with DR Poggi- Memphys Hensarling is a 67 y.o. male who presents for evaluation and treatment of a left shoulder injury which occurred 1 week ago. Apparently he was leaving a horse while holding onto the rains when the horse suddenly reared causing his shoulder to dislocate. He presented to the emergency room where the shoulder was reduced under conscious sedation. The patient notes that his shoulder is feeling reasonably comfortable but he has a lot of pain in the upper arm extending to the elbow region. He also notes  numbness and tingling as well as pain extending from his elbow down to his wrist. He has been wearing his shoulder immobilizer on a regular basis, removing it for bathing purposes. He rates his pain at 8/10 on today's visit and has been taking naproxen and oxycodone as necessary with temporary partial relief of his symptoms. He is right-hand dominant. He denies any prior dislocation or other injury to the left shoulder in the past.The patient is quite concerned by the neurologic deficits he continues to experience after his acute shoulder dislocation. It does appear that he has some deficits, primarily involving the radial and ulnar nerve distributions, although he also appears to have some deficits with the musculocutaneous and median nerves as well. Therefore, I have recommended that he undergo an EMG/NCV study of the left upper extremity to assess these nerves for evidence of damage and reinnervation. In addition, he will be started in occupational therapy to work on range of motion and strength exercises to the left upper extremity. Finally, he will be sent for an MRI scan of the left shoulder to evaluate for rotator cuff tear. Meanwhile, the patient will continue to wear his shoulder immobilizer on a regular basis, removing it for bathing purposes and for exercises. He may continue his present medication regimen, but is encouraged to wean off of  his oxycodone as soon as possible.    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    Currently in Pain? Yes    Pain Score 2     Pain Location Hand    Pain Orientation Left    Pain Descriptors / Indicators Tingling    Pain Type Acute pain                          OT Treatments/Exercises (OP) - 03/28/23 0001       LUE Contrast Bath   Time 8 minutes    Comments decrease edema and increase motion           Soft tissue massage to [facility] and Carpal stretch prior to  ROM   PROM to MC flexion and intrinsic fist - followed by composite12 reps Place and hold 12x touching palm PT TO STOP DOING GRIPPER AT NIGHT  And wear compression glove  FOCUS ON MOTION WIth extention in between fisting Thumb PA and RA PROM - 10 reps PROM for thumb flexion IP , MC and composite Followed by opposition with thumb RA and PA inbetween Add rubber band for PA of thumb   Light blue putty for digits extention 12 reps- focus on keep forearm still -digits doing extention  And then gripping with lat pinch - into putty   PROM for wrist flexion ,ext - with elbow supported all activities Review again and then elbow supported 1 lbs weight RD, UD and wrist flexion, extention PROM sup/pronation  REINFORCE for pt to follow thru with PT HEP for shoulder and elbow daily          OT Education - 03/28/23 0902     Education Details progress and changes to HEP    Person(s) Educated Patient    Methods Explanation;Demonstration;Tactile cues;Verbal cues;Handout    Comprehension Verbal cues required;Returned demonstration;Verbalized understanding                 OT Long Term Goals - 03/21/23 1020       OT LONG TERM GOAL #1   Title Patient wife independent in home program to decrease edema and pain less 3-4/10 at rest show increased elbow, wrist and digit active into motion    Baseline Pain 6-9/10 from elbow down to digits as well as increased edema from elbow down to hand; decreased range of motion in elbow, wrist, forearm as well as to chest NOW pain decrease to 3/10 in radial hand and wrist- increase motion in forearm, wrist and hand  - pt had surgery 03/12/23 seen by PT for shoulder and elbow    Status Achieved      OT LONG TERM GOAL #2   Title L Elbow flexion and extension increased to within normal limits for patient able to initiate washing face and put hand through sleeve if    Baseline Had surgery to shoulder and bicep- PT adressing shoulder and elbow    Status  Deferred      OT LONG TERM GOAL #3   Title L wrist  and forearm ROM and strength increase to 4-/5 to turn doorknob and push hand thru sleeve    Baseline Wrist in all planes decrease - pain  - and 3 to 4-/5 - in all planes- unable to use hand or wrist  NOW pain decrease wrist RD, UD WNL ,  sup/pro WNL but decrease wrist flexion and extention - tight and pain with PROM    Time 5    Period Weeks    Status On-going    Target Date 04/25/23      OT LONG TERM GOAL #4   Title L hand digits increase for pt to touch palm and digits extention WNL to donn gloves    Baseline decrease digits flexion MC's 60-70 and 2nd PIP 60; extnetion -30 to -40 MC's nad -30 for PIP  NOW pt able to get full extention of digits, flexion decrease to 60's at [facility] flexion , and PIP's 85-95 -    Time 5    Period Weeks    Status On-going    Target Date 04/25/23      OT LONG TERM GOAL #5   Title L thumb AROM increase to WNL for pt to hold cup and turn doorknob    Baseline L thumb flexion decrease greatly -and edema , pain - PA and RA 35 - tight in webspace  NOW numbness still in radial hand and wrist- PA WNL 50 but RA decrease to 35 and tight - 3/10 pain - decrease opposition to 5th DIP    Time 6    Period Weeks    Status On-going    Target Date 05/02/23                   Plan - 03/28/23 8657     Clinical Impression Statement Pt was refer to OT with diagnosis of L shoulder anterior dislocation on 01/24/23. Pt is R hand dominant. Pt was seen for 9 visits prior to shoulder and bicep surgery 03/12/23 by DR Dewaine Conger. Prior to surgery pt made great progress  in elbow, forearm, wrist  and digits AROM - pain improved from 7-9/10 to 3/10 -only now more tingling on dorsal radial hand this date. Pt cont to have  edema , pain and stiffness in L hand  over MC's the most and thumb. Wrist with flexion improving more than  extention. Reinforce again for pt to wear his isotoner glove, Massage and PROM composite fist. In session pt show  decrease edema, stiffness with use of contrast, soft tissue and PROM. REINFORCE with pt again working on composite fist- pt was doing gripping at night time and no glove. Pt to work motion to palm , light putty for digits extention and rubber band for thumb PA.  Pt to cont with wrist splint but take off and on alternating 2 hrs. Pt seen PT for HEP for PROM to shoulder and elbow- reinforce for pt to do them daily as advice. ? if doing correctly.   Pt can benefit from skilled OT services to decrease edema, pain and increase AROM and strength in L wrist and hand to be able to use L hand in functional tasks- pt had CVA in past but had full motion and strength with diminshed sensation in thumb and 2nd digit    OT Occupational Profile and History Problem Focused Assessment - Including review of records relating to presenting problem    Occupational performance deficits (Please refer to evaluation for details): ADL's;IADL's;Rest and Sleep;Work;Play;Social Participation;Leisure    Body Structure / Function / Physical Skills ADL;Edema;Dexterity;Decreased knowledge of use of DME;Decreased knowledge of precautions;Flexibility;ROM;UE functional use;FMC;Sensation;Pain;Strength;IADL;Proprioception;Coordination    Rehab Potential Fair    Clinical Decision Making Several treatment options, min-mod task modification necessary    Comorbidities Affecting Occupational Performance: May have comorbidities impacting occupational performance  Modification or Assistance to Complete Evaluation  Min-Moderate modification of tasks or assist with assess necessary to complete eval    OT Frequency 2x / week    OT Duration 8 weeks    OT Treatment/Interventions Self-care/ADL training;Moist Heat;Paraffin;Fluidtherapy;Contrast Bath;DME and/or AE instruction;Therapeutic exercise;Splinting;Patient/family education;Therapeutic activities;Manual Therapy;Passive range of motion    Consulted and Agree with Plan of Care Patient;Family  member/caregiver             Patient will benefit from skilled therapeutic intervention in order to improve the following deficits and impairments:   Body Structure / Function / Physical Skills: ADL, Edema, Dexterity, Decreased knowledge of use of DME, Decreased knowledge of precautions, Flexibility, ROM, UE functional use, FMC, Sensation, Pain, Strength, IADL, Proprioception, Coordination       Visit Diagnosis: Nerve injury  Stiffness of left wrist, not elsewhere classified  Stiffness of left hand, not elsewhere classified  Pain in left arm  Muscle weakness (generalized)    Problem List Patient Active Problem List   Diagnosis Date Noted   Acute CVA (cerebrovascular accident) (HCC) 03/20/2021   HTN (hypertension) 03/20/2021   HLD (hyperlipidemia) 03/20/2021   Anemia 03/20/2021   Hypokalemia 03/20/2021    Oletta Cohn, OTR/L,CLT 03/28/2023, 10:31 AM  [facility] [address], [place], [address] Phone: [phone]   Fax:  [phone]  Name: Jimmy Montoya MRN: [id number] Date of Birth: [birth year]

## 2023-03-28 NOTE — Progress Notes (Signed)
Patient tolerated CT well. Drank water after. Vital signs stable encourage to drink water throughout day.Reasons explained and verbalized understanding. Ambulated steady gait.  

## 2023-04-02 ENCOUNTER — Ambulatory Visit: Payer: Medicare Other | Attending: Physician Assistant | Admitting: Occupational Therapy

## 2023-04-02 DIAGNOSIS — M6281 Muscle weakness (generalized): Secondary | ICD-10-CM | POA: Diagnosis present

## 2023-04-02 DIAGNOSIS — M25512 Pain in left shoulder: Secondary | ICD-10-CM | POA: Insufficient documentation

## 2023-04-02 DIAGNOSIS — M25612 Stiffness of left shoulder, not elsewhere classified: Secondary | ICD-10-CM | POA: Diagnosis present

## 2023-04-02 DIAGNOSIS — M79602 Pain in left arm: Secondary | ICD-10-CM | POA: Diagnosis present

## 2023-04-02 DIAGNOSIS — M25642 Stiffness of left hand, not elsewhere classified: Secondary | ICD-10-CM | POA: Diagnosis present

## 2023-04-02 DIAGNOSIS — T148XXA Other injury of unspecified body region, initial encounter: Secondary | ICD-10-CM | POA: Diagnosis present

## 2023-04-02 DIAGNOSIS — M25632 Stiffness of left wrist, not elsewhere classified: Secondary | ICD-10-CM | POA: Diagnosis present

## 2023-04-02 NOTE — Therapy (Signed)
[facility] [address], [place], [address] Phone: [phone]   Fax:  [phone]  Occupational Therapy Treatment  Patient Details  Name: Jimmy Montoya MRN: [id number] Date of Birth: 14-May-1956 Referring Provider (OT): Dr Joice Lofts   Encounter Date: 04/02/2023   OT End of Session - 04/02/23 1113     Visit Number 12    Number of Visits 16    Date for OT Re-Evaluation 05/02/23    OT Start Time 1114    OT Stop Time 1156    OT Time Calculation (min) 42 min    Activity Tolerance Patient tolerated treatment well    Behavior During Therapy [facility] for tasks assessed/performed             Past Medical History:  Diagnosis Date   Anemia 03/20/2021   Flu 09/29/2015   Hernia, inguinal, right 09/02/2015   Hyperlipemia    Hypertension    Kidney stone     Past Surgical History:  Procedure Laterality Date   HERNIA REPAIR     INGUINAL HERNIA REPAIR Right 12/23/2015   Procedure: HERNIA REPAIR INGUINAL ADULT;  Surgeon: Nadeen Landau, MD;  Location: [facility] ORS;  Service: General;  Laterality: Right;    There were no vitals filed for this visit.   Subjective Assessment - 04/02/23 1113     Subjective  Wore my glove more- arm in sling when out and about and church- done the putty pushing the fingers and making fist - swellling better- grip still weak    Pertinent History 02/01/23 Ortho note with DR Poggi- Jimmy Montoya is a 67 y.o. male who presents for evaluation and treatment of a left shoulder injury which occurred 1 week ago. Apparently he was leaving a horse while holding onto the rains when the horse suddenly reared causing his shoulder to dislocate. He presented to the emergency room where the shoulder was reduced under conscious sedation. The patient notes that his shoulder is feeling reasonably comfortable but he has a lot of pain in the upper arm extending to the elbow region. He also notes numbness and tingling as well as  pain extending from his elbow down to his wrist. He has been wearing his shoulder immobilizer on a regular basis, removing it for bathing purposes. He rates his pain at 8/10 on today's visit and has been taking naproxen and oxycodone as necessary with temporary partial relief of his symptoms. He is right-hand dominant. He denies any prior dislocation or other injury to the left shoulder in the past.The patient is quite concerned by the neurologic deficits he continues to experience after his acute shoulder dislocation. It does appear that he has some deficits, primarily involving the radial and ulnar nerve distributions, although he also appears to have some deficits with the musculocutaneous and median nerves as well. Therefore, I have recommended that he undergo an EMG/NCV study of the left upper extremity to assess these nerves for evidence of damage and reinnervation. In addition, he will be started in occupational therapy to work on range of motion and strength exercises to the left upper extremity. Finally, he will be sent for an MRI scan of the left shoulder to evaluate for rotator cuff tear. Meanwhile, the patient will continue to wear his shoulder immobilizer on a regular basis, removing it for bathing purposes and for exercises. He may continue his present medication regimen, but is encouraged to wean off of his oxycodone as soon as possible.  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    Currently in Pain? Yes    Pain Score 3     Pain Location Hand    Pain Descriptors / Indicators Burning;Pins and needles    Pain Type Acute pain                [facility] OT Assessment - 04/02/23 0001       Strength   Left Hand Lateral Pinch 4 lbs    Left Hand 3 Point Pinch 6 lbs                 Pt arrive with compression glove on and swelling decrease in L hand - increase extention - able to tapping digits  And  place and hold touching palm 3rd thru 5th  Thumb flexion  And opposition to all diigts      OT Treatments/Exercises (OP) - 04/02/23 0001       LUE Contrast Bath   Time 8 minutes    Comments decrease edema and increase motion              Soft tissue massage to [facility] and Carpal stretch prior to ROM    PROM to MC flexion and intrinsic fist - followed by composite12 reps Place and hold 12x touching palm Cont  to wear compression glove  FOCUS ON MOTION WIth extention in between fisting Thumb PA and RA PROM - 10 reps PROM for thumb flexion IP , MC and composite Followed by opposition with thumb RA and PA inbetween Also working on ind motion of digits- doing diff signs Cont rubber band for PA of thumb and PA  12 reps    Light blue putty for digits extention 12 reps- focus on keep forearm still -digits doing extention Doing welll  And then cont with lat pinch - into putty and 3 point pinch - with elbow supported    PROM for wrist flexion ,ext - with elbow supported all activities Review again and then elbow supported 1 lbs weight RD, UD and wrist flexion, extention PROM sup/pronation   REINFORCE for pt to follow thru with PT HEP for shoulder and elbow daily         OT Education - 04/02/23 1113     Education Details progress and changes to HEP    Person(s) Educated Patient    Methods Explanation;Demonstration;Tactile cues;Verbal cues;Handout    Comprehension Verbal cues required;Returned demonstration;Verbalized understanding                 OT Long Term Goals - 03/21/23 1020       OT LONG TERM GOAL #1   Title Patient wife independent in home program to decrease edema and pain less 3-4/10 at rest show increased elbow, wrist and digit active into motion    Baseline Pain 6-9/10 from elbow down to digits as well as increased edema from elbow down to hand; decreased range of motion in elbow, wrist, forearm as well as to chest NOW pain decrease to 3/10 in radial  hand and wrist- increase motion in forearm, wrist and hand  - pt had surgery 03/12/23 seen by PT for shoulder and elbow    Status Achieved      OT LONG TERM GOAL #2   Title L Elbow flexion and extension increased to within normal limits for patient able to initiate washing face  and put hand through sleeve if    Baseline Had surgery to shoulder and bicep- PT adressing shoulder and elbow    Status Deferred      OT LONG TERM GOAL #3   Title L wrist  and forearm ROM and strength increase to 4-/5 to turn doorknob and push hand thru sleeve    Baseline Wrist in all planes decrease - pain  - and 3 to 4-/5 - in all planes- unable to use hand or wrist  NOW pain decrease wrist RD, UD WNL , sup/pro WNL but decrease wrist flexion and extention - tight and pain with PROM    Time 5    Period Weeks    Status On-going    Target Date 04/25/23      OT LONG TERM GOAL #4   Title L hand digits increase for pt to touch palm and digits extention WNL to donn gloves    Baseline decrease digits flexion MC's 60-70 and 2nd PIP 60; extnetion -30 to -40 MC's nad -30 for PIP  NOW pt able to get full extention of digits, flexion decrease to 60's at [facility] flexion , and PIP's 85-95 -    Time 5    Period Weeks    Status On-going    Target Date 04/25/23      OT LONG TERM GOAL #5   Title L thumb AROM increase to WNL for pt to hold cup and turn doorknob    Baseline L thumb flexion decrease greatly -and edema , pain - PA and RA 35 - tight in webspace  NOW numbness still in radial hand and wrist- PA WNL 50 but RA decrease to 35 and tight - 3/10 pain - decrease opposition to 5th DIP    Time 6    Period Weeks    Status On-going    Target Date 05/02/23                   Plan - 04/02/23 1113     Clinical Impression Statement Pt was refer to OT with diagnosis of L shoulder anterior dislocation on 01/24/23. Pt is R hand dominant. Pt was seen for 9 visits prior to shoulder and bicep surgery 03/12/23 by DR Dewaine Conger. Prior to  surgery pt made great progress  in elbow, forearm, wrist  and digits AROM - pain improved from 7-9/10 to 3/10 -only now more tingling on dorsal radial hand this date. Pt cont to have  edema , pain and stiffness in L hand  over MC's the most and thumb. Wrist with flexion improving more than  extention. Reinforce again for pt to wear his isotoner glove, Massage and PROM composite fist. Pt arrive this date with decrease edema , increase exention and flexion of digits - pt to cont withpt again working on composite fist and individual motion of digits - Reinforce to cont wearing compression glove and to work motion to palm , light putty for digits extention and rubber band for thumb PA.  Pt to cont with wrist splint but take off and on alternating 2 hrs. Pt seen PT for HEP for PROM to shoulder and elbow- reinforce for pt to do them daily as advice. ? if doing correctly.   Pt can benefit from skilled OT services to decrease edema, pain and increase AROM and strength in L wrist and hand to be able to use L hand in functional tasks- pt had CVA in past but had full motion and strength with diminshed  sensation in thumb and 2nd digit    OT Occupational Profile and History Problem Focused Assessment - Including review of records relating to presenting problem    Occupational performance deficits (Please refer to evaluation for details): ADL's;IADL's;Rest and Sleep;Work;Play;Social Participation;Leisure    Body Structure / Function / Physical Skills ADL;Edema;Dexterity;Decreased knowledge of use of DME;Decreased knowledge of precautions;Flexibility;ROM;UE functional use;FMC;Sensation;Pain;Strength;IADL;Proprioception;Coordination    Rehab Potential Fair    Clinical Decision Making Several treatment options, min-mod task modification necessary    Comorbidities Affecting Occupational Performance: May have comorbidities impacting occupational performance    Modification or Assistance to Complete Evaluation  Min-Moderate  modification of tasks or assist with assess necessary to complete eval    OT Frequency 2x / week    OT Duration 8 weeks    OT Treatment/Interventions Self-care/ADL training;Moist Heat;Paraffin;Fluidtherapy;Contrast Bath;DME and/or AE instruction;Therapeutic exercise;Splinting;Patient/family education;Therapeutic activities;Manual Therapy;Passive range of motion    Consulted and Agree with Plan of Care Patient;Family member/caregiver             Patient will benefit from skilled therapeutic intervention in order to improve the following deficits and impairments:   Body Structure / Function / Physical Skills: ADL, Edema, Dexterity, Decreased knowledge of use of DME, Decreased knowledge of precautions, Flexibility, ROM, UE functional use, FMC, Sensation, Pain, Strength, IADL, Proprioception, Coordination       Visit Diagnosis: Nerve injury  Stiffness of left wrist, not elsewhere classified  Stiffness of left hand, not elsewhere classified  Pain in left arm  Muscle weakness (generalized)    Problem List Patient Active Problem List   Diagnosis Date Noted   Acute CVA (cerebrovascular accident) (HCC) 03/20/2021   HTN (hypertension) 03/20/2021   HLD (hyperlipidemia) 03/20/2021   Anemia 03/20/2021   Hypokalemia 03/20/2021    Oletta Cohn, OTR/L,CLT 04/02/2023, 12:01 PM  [facility] [address] [place], [address] Phone: [phone]   Fax:  [phone]  Name: Neiman Voltaire MRN: [id number] Date of Birth: [birth year]

## 2023-04-03 ENCOUNTER — Encounter: Payer: Medicare Other | Admitting: Physical Therapy

## 2023-04-04 ENCOUNTER — Ambulatory Visit: Payer: Medicare Other | Admitting: Physical Therapy

## 2023-04-04 DIAGNOSIS — T148XXA Other injury of unspecified body region, initial encounter: Secondary | ICD-10-CM | POA: Diagnosis not present

## 2023-04-04 DIAGNOSIS — M6281 Muscle weakness (generalized): Secondary | ICD-10-CM

## 2023-04-04 DIAGNOSIS — M79602 Pain in left arm: Secondary | ICD-10-CM

## 2023-04-04 NOTE — Therapy (Signed)
OUTPATIENT PHYSICAL THERAPY TREATMENT NOTE   Patient Name: Jimmy Montoya MRN: [id number] DOB:Apr 19, 1956, 67 y.o., male Today's Date: 04/04/2023  PCP: Dr. Jostin Rue Nones  REFERRING PROVIDER: Dr. Massie Bougie   END OF SESSION:   PT End of Session - 04/04/23 0856     Visit Number 4    Number of Visits 24    Date for PT Re-Evaluation 06/06/23    Authorization Type [employer]    Authorization - Visit Number 4    Authorization - Number of Visits 20    Progress Note Due on Visit 10    PT Start Time 0815    PT Stop Time 0845    PT Time Calculation (min) 30 min    Equipment Utilized During Treatment Other (comment)  L shoulder immobilizer with abduction pillow   Activity Tolerance Patient tolerated treatment well    Behavior During Therapy [facility] for tasks assessed/performed              Past Medical History:  Diagnosis Date   Anemia 03/20/2021   Flu 09/29/2015   Hernia, inguinal, right 09/02/2015   Hyperlipemia    Hypertension    Kidney stone    Past Surgical History:  Procedure Laterality Date   HERNIA REPAIR     INGUINAL HERNIA REPAIR Right 12/23/2015   Procedure: HERNIA REPAIR INGUINAL ADULT;  Surgeon: Nadeen Landau, MD;  Location: [facility] ORS;  Service: General;  Laterality: Right;   Patient Active Problem List   Diagnosis Date Noted   Acute CVA (cerebrovascular accident) (HCC) 03/20/2021   HTN (hypertension) 03/20/2021   HLD (hyperlipidemia) 03/20/2021   Anemia 03/20/2021   Hypokalemia 03/20/2021    REFERRING DIAG: s/p L shoulder arthroscopy, RC repair and biceps tenodesis   THERAPY DIAG:  Pain in left arm  Muscle weakness (generalized)  Rationale for Evaluation and Treatment Rehabilitation  PERTINENT HISTORY: Per Dr. Binnie Rail note (02/01/23), L shoulder injury occurred 1 week prior while holding reigns of a horse and horse suddenly reared causing L shoulder to dislocate which was reduced under conscious sedation. Underwent EMG/NVC and MRI. MRI  found L rotator cuff tear. S/p L rotator cuff repair and biceps tenodesis on 03/12/23.   PRECAUTIONS: No AROM of the elbow or shoulder                              No Shoulder ER beyond 40 deg                              No Shoulder Ext or horizontal abduction past neutral                             No lifting objects                              Place a towel roll or pillow under elbow while laying supine to avoid shoulder ext                             No friction massage of surgical site    SUBJECTIVE:  SUBJECTIVE STATEMENT:  Pt reports that he has been able to perform all exercises independently.   PAIN:  Are you having pain? Yes: NPRS scale: 1/10 Pain location: Left anterior shoulder  Pain description: Achy  Aggravating factors: Moving shoulder  Relieving factors: Tylenol    OBJECTIVE: (objective measures completed at initial evaluation unless otherwise dated)   DIAGNOSTIC FINDINGS:  Unable to obtain due to out of system imaging    PATIENT SURVEYS:  FOTO 34     COGNITION: Overall cognitive status: Within functional limits for tasks assessed                                  SENSATION: Reports tingling in fingers    POSTURE: WFL    UPPER EXTREMITY ROM:    Passive ROM Right eval Left eval  Shoulder flexion  170 65  Shoulder extension      Shoulder abduction  180 40  Shoulder adduction      Shoulder internal rotation   40 at 30 deg abduction  Shoulder external rotation 90  15  (Blank rows = not tested)   UPPER EXTREMITY MMT: Unable to assess due to protocol    MMT Right eval Left eval  Shoulder flexion      Shoulder extension      Shoulder abduction      Shoulder adduction      Shoulder internal rotation      Shoulder external rotation      Middle trapezius      Lower  trapezius      Elbow flexion      Elbow extension      Wrist flexion      Wrist extension      Wrist ulnar deviation      Wrist radial deviation      Wrist pronation      Wrist supination      Grip strength (lbs)      (Blank rows = not tested)     JOINT MOBILITY TESTING:  Unable to assess due to protocol   PALPATION:  Bandaging surrounding shoulder. Unable to assess             TODAY'S TREATMENT:                                                                                                                                         DATE:   04/04/23: All exercises performed on LUE  Scap Squeezes with 3 sec isometric hold 3 x 10 Supine Shoulder ER PROM to 40 deg at 0 deg abduction 3 x 10  Supine Shoulder Abduction to 60 deg 3 x 10   Supine Shoulder Flexion PROM to 55 deg 3 x 10  -min VC to flex arm as far as possible   03/20/23: All exercises performed on LUE  Shoulder PROM  -Flex R/L 170/65 -Abd R/L 180/40  -ER at 0 deg abd R/L 90/15   Supine Shoulder Flex PROM 1 x 10 Supine Shoulder Abd PROM 1 x 10  Supine Shoulder IR/ER at 0 deg abd 1 x 10   Pendulums Forward/Backward 1 x 10  Pendulums Side to Side 1 x 10   03/18/23 Review of biceps tendinosis protocol with emphasis on precautions.    03/14/23       PATIENT EDUCATION: Education details: HEP, goals, POC Person educated: Patient Education method: Explanation, Demonstration, and Handouts Education comprehension: verbalized understanding and returned demonstration   HOME EXERCISE PROGRAM: Access Code: [email or web address] URL: [email or web address] Date: 04/04/2023 Prepared by: Ellin Goodie  Exercises - Seated Scapular Retraction  - 1 x daily - 3 sets - 10 reps - Ball Squeeze With Shoulder Sling  - 1 x daily - 3 sets - 10 reps - Wrist Flexion and Extension With Shoulder Sling  - 1 x daily - 3 sets - 10 reps - Closing and Opening Hand With Shoulder Sling  - 1 x daily - 3 sets - 10 reps - Forearm Pronation  and Supination With Shoulder Sling  - 1 x daily - 3 sets - 10 reps - Supine Shoulder Flexion PROM  - 1 x daily - 3 sets - 10 reps - Supported Elbow Flexion Extension PROM  - 1 x daily - 3 sets - 10 reps - Shoulder External Rotation and Internal Rotation PROM and use other hand to move left arm   - 1 x daily - 3 sets - 10 reps - Supine Shoulder Abduction PROM with Caregiver  - 1 x daily - 3 sets - 10 reps - Circular Shoulder Pendulum with Table Support  - 3 x daily - 1 x weekly - 10 reps - Flexion-Extension Shoulder Pendulum with Table Support  - 1 x daily - 3 sets - 10 reps - Horizontal Shoulder Pendulum with Table Support  - 1 x daily - 3 sets - 10 reps   ASSESSMENT:   CLINICAL IMPRESSION: Pt presents s/p 3 weeks for left shoulder rotator cuff repair and debridement and biceps tenodesis. Reviewed HEP extensively to gauge pt's understanding of exercises. Pt was able to perform all exercises independently with min multimodal cueing. Modified HEP to have caretaker perform PROM ER exercises given pt's difficulty in reaching across his body. Pt presented to session without wearing sling. PT instructed pt that he needs to wear sling until his physician instructs him not to and pt acknowledged instruction by saying he would put on sling when he leaves. Patient will continue to  benefit from skilled therapy to address deficits in order to improve quality of life and return to PLOF while following protocol provided by MD.    OBJECTIVE IMPAIRMENTS: decreased activity tolerance, decreased mobility, decreased ROM, decreased strength, hypomobility, impaired flexibility, impaired sensation, impaired UE functional use, improper body mechanics, and pain.    ACTIVITY LIMITATIONS: carrying, lifting, bathing, toileting, dressing, reach over head, and hygiene/grooming   PARTICIPATION LIMITATIONS: meal prep, cleaning, laundry, driving, community activity, occupation, and yard work   PERSONAL FACTORS: Age, Fitness,  Profession, and Time since onset of injury/illness/exacerbation are also affecting patient's functional outcome.    REHAB POTENTIAL: Good   CLINICAL DECISION MAKING: Stable/uncomplicated   EVALUATION COMPLEXITY: Low     GOALS: Goals reviewed with patient? Yes   SHORT TERM GOALS: Target date: 04/11/2023   Patient will be independent in home exercise program to improve strength/mobility for  better functional independence with ADLs. Baseline: 5/16: HEP given Goal status: Ongoing   2. Patient will demonstrate knowledge of how to correctly don and doff left shoulder brace in order to be able to do it after surgery.             Baseline: NT 03/18/23: Pt demonstrates understanding             Goal status: Achieved      LONG TERM GOALS: Target date: 06/06/2023     Patient will increase FOTO score to equal to or greater than 50 to demonstrate statistically significant improvement in mobility and quality of life. Baseline: 5/16: 34 Goal status: Ongoing    2.  Patient will show symmetrical strength between left shoulder with right shoulder to regain ability to perform reaching tasks for his job  Baseline: see above Goal status: Ongoing    3.  Patient will improve AROM UE so they are able to perform typical job tasks and overhead ADL's such as reaching into cabinets. Baseline: see above Goal status: Ongoing    4.  Patient will report a worst pain of 3/10 on NPRS in to improve tolerance with ADLs and reduced symptoms with activities. Baseline: see above Goal status: Ongoing        PLAN:   PT FREQUENCY: 1-2x/week   PT DURATION: 12 weeks   PLANNED INTERVENTIONS: Therapeutic exercises, Therapeutic activity, Neuromuscular re-education, Balance training, Gait training, Patient/Family education, Self Care, Joint mobilization, Joint manipulation, Cryotherapy, Moist heat, and Manual therapy   PLAN FOR NEXT SESSION: Begin Phase II Therex on protocol: Begin AAROM in supine and grade I and II  shoulder mobilizations. Gentle ROM stretch to reach ROM goals. DO NOT BEGIN BICEPS STRENGTHENING UNTIL WEEK 8.    Ellin Goodie PT, DPT  04/04/2023, 8:57 AM

## 2023-04-08 ENCOUNTER — Encounter: Payer: Medicare Other | Admitting: Physical Therapy

## 2023-04-10 ENCOUNTER — Ambulatory Visit: Payer: Medicare Other | Admitting: Physical Therapy

## 2023-04-10 DIAGNOSIS — M79602 Pain in left arm: Secondary | ICD-10-CM

## 2023-04-10 DIAGNOSIS — M6281 Muscle weakness (generalized): Secondary | ICD-10-CM

## 2023-04-10 DIAGNOSIS — T148XXA Other injury of unspecified body region, initial encounter: Secondary | ICD-10-CM | POA: Diagnosis not present

## 2023-04-10 NOTE — Therapy (Addendum)
OUTPATIENT PHYSICAL THERAPY TREATMENT NOTE   Patient Name: Jimmy Montoya MRN: [id number] DOB:11-26-1955, 67 y.o., male Today's Date: 04/10/2023  PCP: Dr. Eisha Chatterjee Nones  REFERRING PROVIDER: Dr. Massie Bougie   END OF SESSION:   PT End of Session - 04/10/23 1034     Visit Number 5    Number of Visits 24    Date for PT Re-Evaluation 06/06/23    Authorization Type [employer]    Authorization - Visit Number 5    Authorization - Number of Visits 20    Progress Note Due on Visit 10    PT Start Time 0945    PT Stop Time 1030    PT Time Calculation (min) 45 min    Equipment Utilized During Treatment Other (comment)  L shoulder immobilizer with abduction pillow   Activity Tolerance Patient tolerated treatment well    Behavior During Therapy [facility] for tasks assessed/performed               Past Medical History:  Diagnosis Date   Anemia 03/20/2021   Flu 09/29/2015   Hernia, inguinal, right 09/02/2015   Hyperlipemia    Hypertension    Kidney stone    Past Surgical History:  Procedure Laterality Date   HERNIA REPAIR     INGUINAL HERNIA REPAIR Right 12/23/2015   Procedure: HERNIA REPAIR INGUINAL ADULT;  Surgeon: Nadeen Landau, MD;  Location: [facility] ORS;  Service: General;  Laterality: Right;   Patient Active Problem List   Diagnosis Date Noted   Acute CVA (cerebrovascular accident) (HCC) 03/20/2021   HTN (hypertension) 03/20/2021   HLD (hyperlipidemia) 03/20/2021   Anemia 03/20/2021   Hypokalemia 03/20/2021    REFERRING DIAG: s/p L shoulder arthroscopy, RC repair and biceps tenodesis   THERAPY DIAG:  Pain in left arm  Muscle weakness (generalized)  Rationale for Evaluation and Treatment Rehabilitation  PERTINENT HISTORY: Per Dr. Binnie Rail note (02/01/23), L shoulder injury occurred 1 week prior while holding reigns of a horse and horse suddenly reared causing L shoulder to dislocate which was reduced under conscious sedation. Underwent EMG/NVC and MRI. MRI  found L rotator cuff tear. S/p L rotator cuff repair and biceps tenodesis on 03/12/23.   PRECAUTIONS: No AROM of the elbow or shoulder                              No Shoulder ER beyond 40 deg                              No Shoulder Ext or horizontal abduction past neutral                             No lifting objects                              Place a towel roll or pillow under elbow while laying supine to avoid shoulder ext                             No friction massage of surgical site    SUBJECTIVE:  SUBJECTIVE STATEMENT:  Pt states he has been able to continue to perform exercises and he feels some soreness but otherwise pain is well controlled.    PAIN:  Are you having pain? Yes: NPRS scale: 1/10 Pain location: Left anterior shoulder  Pain description: Achy  Aggravating factors: Moving shoulder  Relieving factors: Tylenol    OBJECTIVE: (objective measures completed at initial evaluation unless otherwise dated)   DIAGNOSTIC FINDINGS:  Unable to obtain due to out of system imaging    PATIENT SURVEYS:  FOTO 34     COGNITION: Overall cognitive status: Within functional limits for tasks assessed                                  SENSATION: Reports tingling in fingers    POSTURE: WFL    UPPER EXTREMITY ROM:    Passive ROM Right eval Left eval  Shoulder flexion  170 65  Shoulder extension      Shoulder abduction  180 40  Shoulder adduction      Shoulder internal rotation   40 at 30 deg abduction  Shoulder external rotation 90  15  (Blank rows = not tested)   UPPER EXTREMITY MMT: Unable to assess due to protocol    MMT Right eval Left eval  Shoulder flexion      Shoulder extension      Shoulder abduction      Shoulder adduction      Shoulder internal rotation      Shoulder  external rotation      Middle trapezius      Lower trapezius      Elbow flexion      Elbow extension      Wrist flexion      Wrist extension      Wrist ulnar deviation      Wrist radial deviation      Wrist pronation      Wrist supination      Grip strength (lbs)      (Blank rows = not tested)     JOINT MOBILITY TESTING:  Unable to assess due to protocol   PALPATION:  Bandaging surrounding shoulder. Unable to assess             TODAY'S TREATMENT:                                                                                                                                         DATE:   6/12: All exercise perform on LUE  AAROM Shoulder Flexion/Extension 3 x 10  AAROM Shoulder Abdution/Adduction 3 x 10  Supine Shoulder Flexion/ Extension with wand and #3 AW 3 x 10  -Pt able to reach about 120 degrees  Supine Shoulder ER in 30 degrees scaption with wand 3 x 10  Supine Shoulder ER at 0  degrees abduction with palms up and  with wand 3 x 10  Supine Shoulder Abduction with want 3 x 10  Seated Should Abduction Table Slides 3 x 10  Seated Shoulder Flexion Table Slides 3 x 10   04/04/23: All exercises performed on LUE  Scap Squeezes with 3 sec isometric hold 3 x 10 Supine Shoulder ER PROM to 40 deg at 0 deg abduction 3 x 10  Supine Shoulder Abduction to 60 deg 3 x 10   Supine Shoulder Flexion PROM to 55 deg 3 x 10  -min VC to flex arm as far as possible   03/20/23: All exercises performed on LUE  Shoulder PROM  -Flex R/L 170/65 -Abd R/L 180/40  -ER at 0 deg abd R/L 90/15   Supine Shoulder Flex PROM 1 x 10 Supine Shoulder Abd PROM 1 x 10  Supine Shoulder IR/ER at 0 deg abd 1 x 10   Pendulums Forward/Backward 1 x 10  Pendulums Side to Side 1 x 10   03/18/23 Review of biceps tendinosis protocol with emphasis on precautions.     PATIENT EDUCATION: Education details: HEP, goals, POC Person educated: Patient Education method: Explanation, Demonstration, and  Handouts Education comprehension: verbalized understanding and returned demonstration   HOME EXERCISE PROGRAM: Access Code: [email or web address] URL: [email or web address] Date: 04/10/2023 Prepared by: Ellin Goodie  Exercises - Seated Shoulder Flexion AAROM with Pulley Behind  - 1 x daily - 3 sets - 10 reps - Seated Shoulder Abduction AAROM with Pulley Behind  - 1 x daily - 3 sets - 10 reps - Scare the Bear   - 1 x daily - 3 sets - 10 reps - Supine Shoulder External Rotation AAROM with Dowel  - 1 x daily - 3 sets - 10 reps - Supine Shoulder Abduction AAROM with Dowel  - 1 x daily - 3 sets - 10 reps - Supine Shoulder External Rotation with Dowel  - 1 x daily - 3 sets - 10 reps - Seated Shoulder Abduction Towel Slide at Table Top  - 1 x daily - 3 sets - 10 reps - Seated Shoulder Flexion Towel Slide at Table Top  - 1 x daily - 3 sets - 10 reps - Seated Scapular Retraction  - 1 x daily - 3 sets - 10 reps   ASSESSMENT:   CLINICAL IMPRESSION: Pt presents s/p 4 weeks for left shoulder rotator cuff repair and debridement and biceps tenodesis. Pt was able to complete all exercises without an increase in her pain and he was able to show independence while performing exercise and he has been given handout of exercises. He is now in [facility] stage of rehab protocol and he will continue with these type of exercises for the next two weeks. Patient will continue to  benefit from skilled therapy to address deficits in order to improve quality of life and return to PLOF while following protocol provided by MD.     OBJECTIVE IMPAIRMENTS: decreased activity tolerance, decreased mobility, decreased ROM, decreased strength, hypomobility, impaired flexibility, impaired sensation, impaired UE functional use, improper body mechanics, and pain.    ACTIVITY LIMITATIONS: carrying, lifting, bathing, toileting, dressing, reach over head, and hygiene/grooming   PARTICIPATION LIMITATIONS: meal prep, cleaning,  laundry, driving, community activity, occupation, and yard work   PERSONAL FACTORS: Age, Fitness, Profession, and Time since onset of injury/illness/exacerbation are also affecting patient's functional outcome.    REHAB POTENTIAL: Good   CLINICAL DECISION MAKING: Stable/uncomplicated   EVALUATION COMPLEXITY: Low  GOALS: Goals reviewed with patient? Yes   SHORT TERM GOALS: Target date: 04/11/2023   Patient will be independent in home exercise program to improve strength/mobility for better functional independence with ADLs. Baseline: 5/16: HEP given Goal status: Ongoing   2. Patient will demonstrate knowledge of how to correctly don and doff left shoulder brace in order to be able to do it after surgery.             Baseline: NT 03/18/23: Pt demonstrates understanding             Goal status: Achieved      LONG TERM GOALS: Target date: 06/06/2023     Patient will increase FOTO score to equal to or greater than 50 to demonstrate statistically significant improvement in mobility and quality of life. Baseline: 5/16: 34 Goal status: Ongoing    2.  Patient will show symmetrical strength between left shoulder with right shoulder to regain ability to perform reaching tasks for his job  Baseline: see above Goal status: Ongoing    3.  Patient will improve AROM UE so they are able to perform typical job tasks and overhead ADL's such as reaching into cabinets. Baseline: see above Goal status: Ongoing    4.  Patient will report a worst pain of 3/10 on NPRS in to improve tolerance with ADLs and reduced symptoms with activities. Baseline: see above Goal status: Ongoing        PLAN:   PT FREQUENCY: 1-2x/week   PT DURATION: 12 weeks   PLANNED INTERVENTIONS: Therapeutic exercises, Therapeutic activity, Neuromuscular re-education, Balance training, Gait training, Patient/Family education, Self Care, Joint mobilization, Joint manipulation, Cryotherapy, Moist heat, and Manual therapy    PLAN FOR NEXT SESSION: Take FOTO. Check that pt understands HEP and have him perform them to demonstrate understanding. Review if he struggles to perform and can perform grade I and II shoulder mobilizations.  DO NOT BEGIN BICEPS STRENGTHENING UNTIL WEEK 8.    Ellin Goodie PT, DPT  04/10/2023, 10:34 AM

## 2023-04-11 ENCOUNTER — Ambulatory Visit: Payer: Medicare Other | Admitting: Occupational Therapy

## 2023-04-11 DIAGNOSIS — M25642 Stiffness of left hand, not elsewhere classified: Secondary | ICD-10-CM

## 2023-04-11 DIAGNOSIS — T148XXA Other injury of unspecified body region, initial encounter: Secondary | ICD-10-CM | POA: Diagnosis not present

## 2023-04-11 DIAGNOSIS — M25632 Stiffness of left wrist, not elsewhere classified: Secondary | ICD-10-CM

## 2023-04-11 DIAGNOSIS — M79602 Pain in left arm: Secondary | ICD-10-CM

## 2023-04-11 DIAGNOSIS — M6281 Muscle weakness (generalized): Secondary | ICD-10-CM

## 2023-04-11 NOTE — Therapy (Signed)
[facility] [address], [place], [address] Phone: [phone]   Fax:  [phone]  Occupational Therapy Treatment  Patient Details  Name: Jimmy Montoya MRN: [id number] Date of Birth: [birth year] Referring Provider (OT): Dr Joice Lofts   Encounter Date: 04/11/2023   OT End of Session - 04/11/23 1544     Visit Number 13    Number of Visits 16    Date for OT Re-Evaluation 05/02/23    OT Start Time 1530    OT Stop Time 1610    OT Time Calculation (min) 40 min    Activity Tolerance Patient tolerated treatment well    Behavior During Therapy [facility] for tasks assessed/performed             Past Medical History:  Diagnosis Date   Anemia 03/20/2021   Flu 09/29/2015   Hernia, inguinal, right 09/02/2015   Hyperlipemia    Hypertension    Kidney stone     Past Surgical History:  Procedure Laterality Date   HERNIA REPAIR     INGUINAL HERNIA REPAIR Right 12/23/2015   Procedure: HERNIA REPAIR INGUINAL ADULT;  Surgeon: Nadeen Landau, MD;  Location: [facility] ORS;  Service: General;  Laterality: Right;    There were no vitals filed for this visit.   Subjective Assessment - 04/11/23 1544     Subjective  Hand is getting better- glove helping for the swelling - wrist stronger    Pertinent History 02/01/23 Ortho note with DR Poggi- Jimmy Montoya is a 67 y.o. male who presents for evaluation and treatment of a left shoulder injury which occurred 1 week ago. Apparently he was leaving a horse while holding onto the rains when the horse suddenly reared causing his shoulder to dislocate. He presented to the emergency room where the shoulder was reduced under conscious sedation. The patient notes that his shoulder is feeling reasonably comfortable but he has a lot of pain in the upper arm extending to the elbow region. He also notes numbness and tingling as well as pain extending from his elbow down to his wrist. He has been wearing his  shoulder immobilizer on a regular basis, removing it for bathing purposes. He rates his pain at 8/10 on today's visit and has been taking naproxen and oxycodone as necessary with temporary partial relief of his symptoms. He is right-hand dominant. He denies any prior dislocation or other injury to the left shoulder in the past.The patient is quite concerned by the neurologic deficits he continues to experience after his acute shoulder dislocation. It does appear that he has some deficits, primarily involving the radial and ulnar nerve distributions, although he also appears to have some deficits with the musculocutaneous and median nerves as well. Therefore, I have recommended that he undergo an EMG/NCV study of the left upper extremity to assess these nerves for evidence of damage and reinnervation. In addition, he will be started in occupational therapy to work on range of motion and strength exercises to the left upper extremity. Finally, he will be sent for an MRI scan of the left shoulder to evaluate for rotator cuff tear. Meanwhile, the patient will continue to wear his shoulder immobilizer on a regular basis, removing it for bathing purposes and for exercises. He may continue his present medication regimen, but is encouraged to wean off of his oxycodone as soon as possible.    Patient Stated Goals I want to be able to use my left arm and hand  again to repair of the [place] take care of the animals.    Currently in Pain? Yes    Pain Score 1     Pain Location Hand    Pain Orientation Left    Pain Descriptors / Indicators Tingling    Pain Type Acute pain                [facility] OT Assessment - 04/11/23 0001       Strength   Left Hand Lateral Pinch 6 lbs    Left Hand 3 Point Pinch 8 lbs                      OT Treatments/Exercises (OP) - 04/11/23 0001       LUE Contrast Bath   Time 8 minutes    Comments decrease edema prior to PROM             Soft tissue massage to  [facility] and Carpal stretch prior to ROM    PROM to [facility] flexion and intrinsic fist - followed by composite12 reps Place and hold 12x touching palm Cont  to wear compression glove - and fitted with CMC neoprene for thumb suppport during lat and 3 point pinch -keeping thumb out of palm FOCUS ON MOTION- and composte fist prior to putty  Light blue add for gripping  With extention in between fisting Thumb PA and RA PROM - 10 reps PROM for thumb flexion IP , MC and composite Followed by opposition with thumb RA and PA inbetween Also working on ind motion of digits- doing diff signs Cont rubber band for PA of thumb and PA  12 reps    Light blue putty for digits extention 12 reps- focus on keep forearm still -digits doing extention Doing welll  And then upgrade to teal med putty for  lat pinch  and 3 point pinch - with elbow supported  Keep wrist straight with all  Can do 2 sets 2 x day     PROM for wrist flexion ,ext - with elbow supported all activities Review again and then elbow supported 1 lbs weight RD, UD and wrist flexion, extention PROM sup/pronation   FMC done this date fingers <> palm <> fingers- 2 cm by 2 cm foam block And then pick up 1 cm object one at time alternate digits  And then also one at time - 4 of them - store in palm and release one at time  15 reps  Several times during day- can use [facility] neoprene        OT Education - 04/11/23 1544     Education Details progress and changes to HEP    Person(s) Educated Patient    Methods Explanation;Demonstration;Tactile cues;Verbal cues;Handout    Comprehension Verbal cues required;Returned demonstration;Verbalized understanding                 OT Long Term Goals - 03/21/23 1020       OT LONG TERM GOAL #1   Title Patient wife independent in home program to decrease edema and pain less 3-4/10 at rest show increased elbow, wrist and digit active into motion    Baseline Pain 6-9/10 from elbow down to digits as well as  increased edema from elbow down to hand; decreased range of motion in elbow, wrist, forearm as well as to chest NOW pain decrease to 3/10 in radial hand and wrist- increase motion in forearm, wrist and hand  - pt had  surgery 03/12/23 seen by PT for shoulder and elbow    Status Achieved      OT LONG TERM GOAL #2   Title L Elbow flexion and extension increased to within normal limits for patient able to initiate washing face and put hand through sleeve if    Baseline Had surgery to shoulder and bicep- PT adressing shoulder and elbow    Status Deferred      OT LONG TERM GOAL #3   Title L wrist  and forearm ROM and strength increase to 4-/5 to turn doorknob and push hand thru sleeve    Baseline Wrist in all planes decrease - pain  - and 3 to 4-/5 - in all planes- unable to use hand or wrist  NOW pain decrease wrist RD, UD WNL , sup/pro WNL but decrease wrist flexion and extention - tight and pain with PROM    Time 5    Period Weeks    Status On-going    Target Date 04/25/23      OT LONG TERM GOAL #4   Title L hand digits increase for pt to touch palm and digits extention WNL to donn gloves    Baseline decrease digits flexion MC's 60-70 and 2nd PIP 60; extnetion -30 to -40 MC's nad -30 for PIP  NOW pt able to get full extention of digits, flexion decrease to 60's at [facility] flexion , and PIP's 85-95 -    Time 5    Period Weeks    Status On-going    Target Date 04/25/23      OT LONG TERM GOAL #5   Title L thumb AROM increase to WNL for pt to hold cup and turn doorknob    Baseline L thumb flexion decrease greatly -and edema , pain - PA and RA 35 - tight in webspace  NOW numbness still in radial hand and wrist- PA WNL 50 but RA decrease to 35 and tight - 3/10 pain - decrease opposition to 5th DIP    Time 6    Period Weeks    Status On-going    Target Date 05/02/23                   Plan - 04/11/23 1544     Clinical Impression Statement Pt was refer to OT with diagnosis of L shoulder  anterior dislocation on 01/24/23. Pt is R hand dominant. Pt was seen for 9 visits prior to shoulder and bicep surgery 03/12/23 by DR Dewaine Conger. Prior to surgery pt made great progress  in elbow, forearm, wrist  and digits AROM - pain improved from 7-9/10 to 1/10 -only now more tingling on dorsal radial hand . Pt cont to have  edema and stiffness in L hand  over MC's the most and thumb. Wrist strength increasing 4+/5 in all planes.  Reinforce again for pt to wear his isotoner glove, Massage and PROM composite fist.  Pt fitted with CMC neoprene over glove for thumb support when doing prehension strength and opposition.  This date wtih increase edema over MC's and stiffness with composite fist. Reinforce to do composite PROM and place and hold to palm prior to attempt of gripping putty light easy putty- with extention inbetwee.  Pt can benefit from skilled OT services to decrease edema, pain and increase AROM and strength in L wrist and hand to be able to use L hand in functional tasks- pt had CVA in past but had full motion and strength with diminshed  sensation in thumb and 2nd digit    OT Occupational Profile and History Problem Focused Assessment - Including review of records relating to presenting problem    Occupational performance deficits (Please refer to evaluation for details): ADL's;IADL's;Rest and Sleep;Work;Play;Social Participation;Leisure    Body Structure / Function / Physical Skills ADL;Edema;Dexterity;Decreased knowledge of use of DME;Decreased knowledge of precautions;Flexibility;ROM;UE functional use;FMC;Sensation;Pain;Strength;IADL;Proprioception;Coordination    Rehab Potential Fair    Clinical Decision Making Several treatment options, min-mod task modification necessary    Comorbidities Affecting Occupational Performance: May have comorbidities impacting occupational performance    Modification or Assistance to Complete Evaluation  Min-Moderate modification of tasks or assist with assess  necessary to complete eval    OT Frequency 1x / week    OT Duration 8 weeks    OT Treatment/Interventions Self-care/ADL training;Moist Heat;Paraffin;Fluidtherapy;Contrast Bath;DME and/or AE instruction;Therapeutic exercise;Splinting;Patient/family education;Therapeutic activities;Manual Therapy;Passive range of motion    Consulted and Agree with Plan of Care Patient;Family member/caregiver             Patient will benefit from skilled therapeutic intervention in order to improve the following deficits and impairments:   Body Structure / Function / Physical Skills: ADL, Edema, Dexterity, Decreased knowledge of use of DME, Decreased knowledge of precautions, Flexibility, ROM, UE functional use, FMC, Sensation, Pain, Strength, IADL, Proprioception, Coordination       Visit Diagnosis: Pain in left arm  Muscle weakness (generalized)  Nerve injury  Stiffness of left wrist, not elsewhere classified  Stiffness of left hand, not elsewhere classified    Problem List Patient Active Problem List   Diagnosis Date Noted   Acute CVA (cerebrovascular accident) (HCC) 03/20/2021   HTN (hypertension) 03/20/2021   HLD (hyperlipidemia) 03/20/2021   Anemia 03/20/2021   Hypokalemia 03/20/2021    Oletta Cohn, OTR/L,CLT 04/11/2023, 4:17 PM  [facility] [address], [place], [address] Phone: [phone]   Fax:  [phone]  Name: Jimmy Montoya MRN: [id number] Date of Birth: 11/07/1955

## 2023-04-16 ENCOUNTER — Encounter: Payer: Medicare Other | Admitting: Physical Therapy

## 2023-04-22 ENCOUNTER — Ambulatory Visit: Payer: Medicare Other

## 2023-04-22 DIAGNOSIS — M79602 Pain in left arm: Secondary | ICD-10-CM

## 2023-04-22 DIAGNOSIS — M25612 Stiffness of left shoulder, not elsewhere classified: Secondary | ICD-10-CM

## 2023-04-22 DIAGNOSIS — M25512 Pain in left shoulder: Secondary | ICD-10-CM

## 2023-04-22 DIAGNOSIS — T148XXA Other injury of unspecified body region, initial encounter: Secondary | ICD-10-CM | POA: Diagnosis not present

## 2023-04-22 DIAGNOSIS — M6281 Muscle weakness (generalized): Secondary | ICD-10-CM

## 2023-04-22 NOTE — Therapy (Signed)
OUTPATIENT PHYSICAL THERAPY TREATMENT    Patient Name: Jimmy Montoya MRN: [id number] DOB:May 08, 1956, 67 y.o., male Today's Date: 04/10/2023  PCP: Dr. Daniel Nones  REFERRING PROVIDER: Dr. Massie Bougie   END OF SESSION:   PT End of Session - 04/22/23 1609     Visit Number 6    Number of Visits 24    Date for PT Re-Evaluation 06/06/23    Authorization Type [employer]IC[employer]    Authorization - Visit Number 6    Authorization - Number of Visits 20    Progress Note Due on Visit 10    PT Start Time 1604    PT Stop Time 1644    PT Time Calculation (min) 40 min    Activity Tolerance Patient tolerated treatment well;Patient limited by pain    Behavior During Therapy [facility] for tasks assessed/performed               Past Medical History:  Diagnosis Date   Anemia 03/20/2021   Flu 09/29/2015   Hernia, inguinal, right 09/02/2015   Hyperlipemia    Hypertension    Kidney stone    Past Surgical History:  Procedure Laterality Date   HERNIA REPAIR     INGUINAL HERNIA REPAIR Right 12/23/2015   Procedure: HERNIA REPAIR INGUINAL ADULT;  Surgeon: Nadeen Landau, MD;  Location: [facility] ORS;  Service: General;  Laterality: Right;   Patient Active Problem List   Diagnosis Date Noted   Acute CVA (cerebrovascular accident) (HCC) 03/20/2021   HTN (hypertension) 03/20/2021   HLD (hyperlipidemia) 03/20/2021   Anemia 03/20/2021   Hypokalemia 03/20/2021    REFERRING DIAG: s/p L shoulder arthroscopy, RC repair and biceps tenodesis   THERAPY DIAG:  Pain in left arm  Muscle weakness (generalized)  Rationale for Evaluation and Treatment Rehabilitation  PERTINENT HISTORY: Per Dr. Binnie Rail note (02/01/23), L shoulder injury occurred 1 week prior while holding reigns of a horse and horse suddenly reared causing L shoulder to dislocate which was reduced under conscious sedation. Underwent EMG/NVC and MRI. MRI found L rotator cuff tear. S/p L rotator cuff repair and biceps tenodesis on  03/12/23.   PRECAUTIONS: No AROM of the elbow or shoulder                              No Shoulder ER beyond 40 deg                              No Shoulder Ext or horizontal abduction past neutral                             No lifting objects                              Place a towel roll or pillow under elbow while laying supine to avoid shoulder ext                             No friction massage of surgical site    SUBJECTIVE:  SUBJECTIVE STATEMENT:  Pt doing well. Was on vacation in [place] and had a nice time.    PAIN:  Are you having pain? 7/10 with pulleys; 3/10 at end of session    OBJECTIVE:              TODAY'S TREATMENT:                                                                                                                                         DATE:   04/22/23 -AA/ROM with pulleys: LLE flexion x30 fast, 3x30sec sustained; ABDCT attempted, but plane is awkward and limited (more scaption than ABDCT) -supine LLE P/ROM flexion 8x30sec H; ABCT 8x30sec H  -Left elbow flexion/extension AA/ROM or P/ROM 1x20 -LLE ER wand, (40 degrees ABDCT, 0 degrees flexion) 1x20, then 4x30sec  -Left UT release with A/ROM rotation in supine   ------------------  6/12: All exercise perform on LUE  AAROM Shoulder Flexion/Extension 3 x 10  AAROM Shoulder Abdution/Adduction 3 x 10  Supine Shoulder Flexion/ Extension with wand and #3 AW 3 x 10  -Pt able to reach about 120 degrees  Supine Shoulder ER in 30 degrees scaption with wand 3 x 10  Supine Shoulder ER at 0 degrees abduction with palms up and  with wand 3 x 10  Supine Shoulder Abduction with wand 3 x 10  Seated Should Abduction table Slides 3 x 10  Seated Shoulder Flexion table Slides 3 x 10   04/04/23: All exercises performed on LUE  Scap  Squeezes with 3 sec isometric hold 3 x 10 Supine Shoulder ER PROM to 40 deg at 0 deg abduction 3 x 10  Supine Shoulder Abduction to 60 deg 3 x 10   Supine Shoulder Flexion PROM to 55 deg 3 x 10  -min VC to flex arm as far as possible   03/20/23: All exercises performed on LUE  Shoulder PROM  -Flex R/L 170/65 -Abd R/L 180/40  -ER at 0 deg abd R/L 90/15   Supine Shoulder Flex PROM 1 x 10 Supine Shoulder Abd PROM 1 x 10  Supine Shoulder IR/ER at 0 deg abd 1 x 10   Pendulums Forward/Backward 1 x 10  Pendulums Side to Side 1 x 10   03/18/23 Review of biceps tendinosis protocol with emphasis on precautions.     PATIENT EDUCATION: Education details: HEP, goals, POC Person educated: Patient Education method: Explanation, Demonstration, and Handouts Education comprehension: verbalized understanding and returned demonstration   HOME EXERCISE PROGRAM: Access Code: [email or web address] URL: [email or web address] Date: 04/10/2023 Prepared by: Ellin Goodie  Exercises - Seated Shoulder Flexion AAROM with Pulley Behind  - 1 x daily - 3 sets - 10 reps - Seated Shoulder Abduction AAROM with Pulley Behind  - 1 x daily - 3 sets - 10 reps - Scare the Bear   - 1 x daily - 3 sets - 10 reps -  Supine Shoulder External Rotation AAROM with Dowel  - 1 x daily - 3 sets - 10 reps - Supine Shoulder Abduction AAROM with Dowel  - 1 x daily - 3 sets - 10 reps - Supine Shoulder External Rotation with Dowel  - 1 x daily - 3 sets - 10 reps - Seated Shoulder Abduction Towel Slide at Table Top  - 1 x daily - 3 sets - 10 reps - Seated Shoulder Flexion Towel Slide at Table Top  - 1 x daily - 3 sets - 10 reps - Seated Scapular Retraction  - 1 x daily - 3 sets - 10 reps   ASSESSMENT:   CLINICAL IMPRESSION: Continued to follow protocol. Muscle tightness and pain limiting ROM to allower parameters, but noted small improvements with progressive low load lengthening. Patient will continue to  benefit from  skilled therapy to address deficits in order to improve quality of life and return to PLOF while following protocol provided by MD.     OBJECTIVE IMPAIRMENTS: decreased activity tolerance, decreased mobility, decreased ROM, decreased strength, hypomobility, impaired flexibility, impaired sensation, impaired UE functional use, improper body mechanics, and pain.    ACTIVITY LIMITATIONS: carrying, lifting, bathing, toileting, dressing, reach over head, and hygiene/grooming   PARTICIPATION LIMITATIONS: meal prep, cleaning, laundry, driving, community activity, occupation, and yard work   PERSONAL FACTORS: Age, Fitness, Profession, and Time since onset of injury/illness/exacerbation are also affecting patient's functional outcome.    REHAB POTENTIAL: Good   CLINICAL DECISION MAKING: Stable/uncomplicated   EVALUATION COMPLEXITY: Low     GOALS: Goals reviewed with patient? Yes   SHORT TERM GOALS: Target date: 04/11/2023   Patient will be independent in home exercise program to improve strength/mobility for better functional independence with ADLs. Baseline: 5/16: HEP given Goal status: Ongoing   2. Patient will demonstrate knowledge of how to correctly don and doff left shoulder brace in order to be able to do it after surgery.             Baseline: NT 03/18/23: Pt demonstrates understanding             Goal status: Achieved      LONG TERM GOALS: Target date: 06/06/2023     Patient will increase FOTO score to equal to or greater than 50 to demonstrate statistically significant improvement in mobility and quality of life. Baseline: 5/16: 34 Goal status: Ongoing    2.  Patient will show symmetrical strength between left shoulder with right shoulder to regain ability to perform reaching tasks for his job  Baseline: see above Goal status: Ongoing    3.  Patient will improve AROM UE so they are able to perform typical job tasks and overhead ADL's such as reaching into cabinets. Baseline:  see above Goal status: Ongoing    4.  Patient will report a worst pain of 3/10 on NPRS in to improve tolerance with ADLs and reduced symptoms with activities. Baseline: see above Goal status: Ongoing        PLAN:   PT FREQUENCY: 1-2x/week   PT DURATION: 12 weeks   PLANNED INTERVENTIONS: Therapeutic exercises, Therapeutic activity, Neuromuscular re-education, Balance training, Gait training, Patient/Family education, Self Care, Joint mobilization, Joint manipulation, Cryotherapy, Moist heat, and Manual therapy   PLAN FOR NEXT SESSION: Take FOTO. Check that pt understands HEP and have him perform them to demonstrate understanding. Review if he struggles to perform and can perform grade I and II shoulder mobilizations.  DO NOT BEGIN BICEPS STRENGTHENING UNTIL  WEEK 8.      04/10/2023, 10:34 AM  4:43 PM, 04/22/23 Rosamaria Lints, PT, DPT Physical Therapist - [place] [phone] (Office)

## 2023-04-23 ENCOUNTER — Ambulatory Visit: Payer: Medicare Other | Admitting: Occupational Therapy

## 2023-04-23 DIAGNOSIS — M79602 Pain in left arm: Secondary | ICD-10-CM

## 2023-04-23 DIAGNOSIS — T148XXA Other injury of unspecified body region, initial encounter: Secondary | ICD-10-CM | POA: Diagnosis not present

## 2023-04-23 DIAGNOSIS — M6281 Muscle weakness (generalized): Secondary | ICD-10-CM

## 2023-04-23 NOTE — Therapy (Signed)
[facility] [address] [place], [address] Phone: [phone]   Fax:  [phone]  Occupational Therapy Treatment  Patient Details  Name: Jimmy Montoya MRN: [id number] Date of Birth: 03/25/1956 Referring Provider (OT): Dr Joice Lofts   Encounter Date: 04/23/2023   OT End of Session - 04/23/23 0829     Visit Number 14    Number of Visits 16    Date for OT Re-Evaluation 05/02/23    OT Start Time 0830    OT Stop Time 0916    OT Time Calculation (min) 46 min    Activity Tolerance Patient tolerated treatment well    Behavior During Therapy Jimmy Montoya for tasks assessed/performed             Past Medical History:  Diagnosis Date   Anemia 03/20/2021   Flu 09/29/2015   Hernia, inguinal, right 09/02/2015   Hyperlipemia    Hypertension    Kidney stone     Past Surgical History:  Procedure Laterality Date   HERNIA REPAIR     INGUINAL HERNIA REPAIR Right 12/23/2015   Procedure: HERNIA REPAIR INGUINAL ADULT;  Surgeon: Nadeen Landau, MD;  Location: [facility] ORS;  Service: General;  Laterality: Right;    There were no vitals filed for this visit.   Subjective Assessment - 04/23/23 0829     Subjective  Biggest problem still probably  the swelling and the stiffness of my fingers.  I did not take all my stuff on vacation and last week so I type of just done some gripping    Pertinent History 02/01/23 Ortho note with DR Poggi- Jimmy Montoya is a 67 y.o. male who presents for evaluation and treatment of a left shoulder injury which occurred 1 week ago. Apparently he was leaving a horse while holding onto the rains when the horse suddenly reared causing his shoulder to dislocate. He presented to the emergency room where the shoulder was reduced under conscious sedation. The patient notes that his shoulder is feeling reasonably comfortable but he has a lot of pain in the upper arm extending to the elbow region. He also notes numbness and  tingling as well as pain extending from his elbow down to his wrist. He has been wearing his shoulder immobilizer on a regular basis, removing it for bathing purposes. He rates his pain at 8/10 on today's visit and has been taking naproxen and oxycodone as necessary with temporary partial relief of his symptoms. He is right-hand dominant. He denies any prior dislocation or other injury to the left shoulder in the past.The patient is quite concerned by the neurologic deficits he continues to experience after his acute shoulder dislocation. It does appear that he has some deficits, primarily involving the radial and ulnar nerve distributions, although he also appears to have some deficits with the musculocutaneous and median nerves as well. Therefore, I have recommended that he undergo an EMG/NCV study of the left upper extremity to assess these nerves for evidence of damage and reinnervation. In addition, he will be started in occupational therapy to work on range of motion and strength exercises to the left upper extremity. Finally, he will be sent for an MRI scan of the left shoulder to evaluate for rotator cuff tear. Meanwhile, the patient will continue to wear his shoulder immobilizer on a regular basis, removing it for bathing purposes and for exercises. He may continue his present medication regimen, but is encouraged to wean off of his oxycodone  as soon as possible.    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    Currently in Pain? Yes    Pain Score 2     Pain Location Hand    Pain Orientation Left    Pain Descriptors / Indicators Tightness    Pain Type Acute pain                [facility] OT Assessment - 04/23/23 0001       Strength   Left Hand Lateral Pinch 7 lbs    Left Hand 3 Point Pinch 7 lbs      Hand Function   Comment 5 objects pick up<> palm<> fingers- R 10 sec, L 23 sec                       OT Treatments/Exercises (OP) - 04/23/23 0001       LUE Contrast Bath   Time 8 minutes    Comments decrease edema , stiffness -prior to PROM composite fist            Soft tissue massage to [facility] and Carpal stretch prior to ROM    PROM to MC flexion and intrinsic fist - followed by composite12 reps Place and hold 12x touching palm Cont  to wear compression glove - and fitted with CMC neoprene for thumb suppport during lat and 3 point pinch -keeping thumb out of palm last visit   FOCUS ON MOTION- and composte fist prior to putty or using it during day  DO FIRST THING IN AM   Thumb PA and RA PROM - 10 reps PROM for thumb flexion IP , MC and composite Followed by opposition with thumb RA and PA inbetween Also working on ind motion of digits- doing diff signs Change for thumb and digits extention - teal putty donut - support elbow 12 reps    Teal putty for digits extention 12 reps- focus on keep forearm still -digits doing extention Doing welll  Upgrade to [employer] putty  lat pinch  and 3 point pinch - with elbow supported  Keep WRIST STRAIGHT with all - fitted Benik neoprene to remind wrist to stay straight Can do 2 sets 2 x day      PROM for wrist flexion ,ext - with elbow supported all activities- FOCUS wrist EXT   FMC done this date fingers <> palm <> fingers- 2 cm by 2 cm foam block/or nuts/bolts in his shop 5  of them - one at time and release one at time  And then pick up 1 cm object one at time alternate digits- but keeping other digits extention               OT Education - 04/23/23 0829     Education Details progress and changes to HEP    Person(s) Educated Patient    Methods Explanation;Demonstration;Tactile cues;Verbal cues;Handout    Comprehension Verbal cues required;Returned demonstration;Verbalized understanding                 OT Long Term Goals - 03/21/23 1020       OT LONG TERM GOAL #1   Title Patient wife  independent in home program to decrease edema and pain less 3-4/10 at rest show increased elbow, wrist and digit active into motion    Baseline Pain 6-9/10 from elbow down  to digits as well as increased edema from elbow down to hand; decreased range of motion in elbow, wrist, forearm as well as to chest NOW pain decrease to 3/10 in radial hand and wrist- increase motion in forearm, wrist and hand  - pt had surgery 03/12/23 seen by PT for shoulder and elbow    Status Achieved      OT LONG TERM GOAL #2   Title L Elbow flexion and extension increased to within normal limits for patient able to initiate washing face and put hand through sleeve if    Baseline Had surgery to shoulder and bicep- PT adressing shoulder and elbow    Status Deferred      OT LONG TERM GOAL #3   Title L wrist  and forearm ROM and strength increase to 4-/5 to turn doorknob and push hand thru sleeve    Baseline Wrist in all planes decrease - pain  - and 3 to 4-/5 - in all planes- unable to use hand or wrist  NOW pain decrease wrist RD, UD WNL , sup/pro WNL but decrease wrist flexion and extention - tight and pain with PROM    Time 5    Period Weeks    Status On-going    Target Date 04/25/23      OT LONG TERM GOAL #4   Title L hand digits increase for pt to touch palm and digits extention WNL to donn gloves    Baseline decrease digits flexion MC's 60-70 and 2nd PIP 60; extnetion -30 to -40 MC's nad -30 for PIP  NOW pt able to get full extention of digits, flexion decrease to 60's at [facility] flexion , and PIP's 85-95 -    Time 5    Period Weeks    Status On-going    Target Date 04/25/23      OT LONG TERM GOAL #5   Title L thumb AROM increase to WNL for pt to hold cup and turn doorknob    Baseline L thumb flexion decrease greatly -and edema , pain - PA and RA 35 - tight in webspace  NOW numbness still in radial hand and wrist- PA WNL 50 but RA decrease to 35 and tight - 3/10 pain - decrease opposition to 5th DIP    Time 6     Period Weeks    Status On-going    Target Date 05/02/23                   Plan - 04/23/23 0830     Clinical Impression Statement Pt was refer to OT with diagnosis of L shoulder anterior dislocation on 01/24/23. Pt is R hand dominant. Pt was seen for 9 visits prior to shoulder and bicep surgery 03/12/23 by DR Dewaine Conger. Prior to surgery pt made great progress  in elbow, forearm, wrist  and digits AROM - pain improved from 7-9/10 to 1/10 -only now more tingling on dorsal radial hand . Pt cont to have  edema and stiffness in L hand  over MC's the most and thumb. Wrist strength increasing 4+/5 in all planes.  Reinforce again for pt to wear his isotoner glove, Massage and PROM composite fist. in the am - prior to using hand during day.   Pt fitted with CMC neoprene/Benik over glove for thumb/wrist support when doing gripping /prehension strength and opposition.  This date wtih increase edema over MC's but better than last time  and stiffness with composite fist. Reinforce to do composite  PROM and place and hold to palm in the am- prior to do gripping otherwise compensate with wrist flexors. Add teal med putty doing donut for thumb and digits extention. Pt can benefit from skilled OT services to decrease edema, stiffness, pain and increase AROM and strength in L digits/ wrist and hand to be able to use L hand in functional tasks- pt had CVA in past but had full motion and strength with diminshed sensation in thumb and 2nd digit    OT Occupational Profile and History Problem Focused Assessment - Including review of records relating to presenting problem    Occupational performance deficits (Please refer to evaluation for details): ADL's;IADL's;Rest and Sleep;Work;Play;Social Participation;Leisure    Body Structure / Function / Physical Skills ADL;Edema;Dexterity;Decreased knowledge of use of DME;Decreased knowledge of precautions;Flexibility;ROM;UE functional  use;FMC;Sensation;Pain;Strength;IADL;Proprioception;Coordination    Rehab Potential Fair    Clinical Decision Making Several treatment options, min-mod task modification necessary    Comorbidities Affecting Occupational Performance: May have comorbidities impacting occupational performance    Modification or Assistance to Complete Evaluation  Min-Moderate modification of tasks or assist with assess necessary to complete eval    OT Frequency 1x / week    OT Duration 8 weeks    OT Treatment/Interventions Self-care/ADL training;Moist Heat;Paraffin;Fluidtherapy;Contrast Bath;DME and/or AE instruction;Therapeutic exercise;Splinting;Patient/family education;Therapeutic activities;Manual Therapy;Passive range of motion    Consulted and Agree with Plan of Care Patient;Family member/caregiver             Patient will benefit from skilled therapeutic intervention in order to improve the following deficits and impairments:   Body Structure / Function / Physical Skills: ADL, Edema, Dexterity, Decreased knowledge of use of DME, Decreased knowledge of precautions, Flexibility, ROM, UE functional use, FMC, Sensation, Pain, Strength, IADL, Proprioception, Coordination       Visit Diagnosis: Pain in left arm  Muscle weakness (generalized)    Problem List Patient Active Problem List   Diagnosis Date Noted   Acute CVA (cerebrovascular accident) (HCC) 03/20/2021   HTN (hypertension) 03/20/2021   HLD (hyperlipidemia) 03/20/2021   Anemia 03/20/2021   Hypokalemia 03/20/2021    Jimmy Montoya, OTR/L,CLT 04/23/2023, 9:37 AM  [facility] [address] [place], [address] Phone: [phone]   Fax:  [phone]  Name: Calahan Pak MRN: [id number] Date of Birth: January 16, 1956

## 2023-04-25 ENCOUNTER — Ambulatory Visit: Payer: Medicare Other

## 2023-04-25 DIAGNOSIS — M6281 Muscle weakness (generalized): Secondary | ICD-10-CM

## 2023-04-25 DIAGNOSIS — M79602 Pain in left arm: Secondary | ICD-10-CM

## 2023-04-25 DIAGNOSIS — M25512 Pain in left shoulder: Secondary | ICD-10-CM

## 2023-04-25 DIAGNOSIS — M25612 Stiffness of left shoulder, not elsewhere classified: Secondary | ICD-10-CM

## 2023-04-25 DIAGNOSIS — T148XXA Other injury of unspecified body region, initial encounter: Secondary | ICD-10-CM | POA: Diagnosis not present

## 2023-04-25 NOTE — Therapy (Signed)
OUTPATIENT PHYSICAL THERAPY TREATMENT    Patient Name: Jimmy Montoya MRN: [id number] DOB:November 15, 1955, 67 y.o., male Today's Date: 04/25/2023  PCP: Dr. Daniel Nones  REFERRING PROVIDER: Dr. Massie Bougie   END OF SESSION:   PT End of Session - 04/25/23 1534     Visit Number 7    Number of Visits 24    Date for PT Re-Evaluation 06/06/23    Authorization Type [employer]    Authorization - Visit Number 7    Authorization - Number of Visits 20    Progress Note Due on Visit 10    PT Start Time 1525    PT Stop Time 1558    PT Time Calculation (min) 33 min    Activity Tolerance Patient tolerated treatment well;No increased pain    Behavior During Therapy [facility] for tasks assessed/performed               Past Medical History:  Diagnosis Date   Anemia 03/20/2021   Flu 09/29/2015   Hernia, inguinal, right 09/02/2015   Hyperlipemia    Hypertension    Kidney stone    Past Surgical History:  Procedure Laterality Date   HERNIA REPAIR     INGUINAL HERNIA REPAIR Right 12/23/2015   Procedure: HERNIA REPAIR INGUINAL ADULT;  Surgeon: Nadeen Landau, MD;  Location: [facility] ORS;  Service: General;  Laterality: Right;   Patient Active Problem List   Diagnosis Date Noted   Acute CVA (cerebrovascular accident) (HCC) 03/20/2021   HTN (hypertension) 03/20/2021   HLD (hyperlipidemia) 03/20/2021   Anemia 03/20/2021   Hypokalemia 03/20/2021    REFERRING DIAG: s/p L shoulder arthroscopy, RC repair and biceps tenodesis   THERAPY DIAG:  Pain in left arm  Muscle weakness (generalized)  Acute pain of left shoulder  Stiffness of left shoulder, not elsewhere classified  Rationale for Evaluation and Treatment Rehabilitation  PERTINENT HISTORY: Per Dr. Binnie Rail note (02/01/23), L shoulder injury occurred 1 week prior while holding reigns of a horse and horse suddenly reared causing L shoulder to dislocate which was reduced under conscious sedation. Underwent EMG/NVC and MRI. MRI  found L rotator cuff tear. S/p L rotator cuff repair and biceps tenodesis on 03/12/23.   PRECAUTIONS: No AROM of the elbow or shoulder                              No Shoulder ER beyond 40 deg                              No Shoulder Ext or horizontal abduction past neutral                             No lifting objects                              Place a towel roll or pillow under elbow while laying supine to avoid shoulder ext                             No friction massage of surgical site    SUBJECTIVE:  SUBJECTIVE STATEMENT:  Pt doing ok today, no pain. Pt did a little work today.  Saw the surgeon who was pleased with shoulder so far, said not to lift anything >3lb.   PAIN:  Are you having pain? None on arrival around   OBJECTIVE:              TODAY'S TREATMENT:                                                                                                                                         DATE:   6/27  04/25/23: -heat applied to shoulder for mobility  -forward table slides 1x20x1secH, then 5x30secH  -Scaption table slides 5x30secH -ABDCT table slides 3x60sec  -LAD traction x5 minutes, variable ABDCT/scaption angle  -Left pec major/minor release x3 minutes   ----------------------------------- 04/23/23 -AA/ROM with pulleys: LLE flexion x30 fast, 3x30sec sustained; ABDCT attempted, but plane is awkward and limited (more scaption than ABDCT) -supine LLE P/ROM flexion 8x30sec H; ABCT 8x30sec H  -Left elbow flexion/extension AA/ROM or P/ROM 1x20 -LLE ER wand, (40 degrees ABDCT, 0 degrees flexion) 1x20, then 4x30sec  -Left UT release with A/ROM rotation in supine   ------------------  6/12: All exercise perform on LUE  AAROM Shoulder Flexion/Extension 3 x 10  AAROM Shoulder Abdution/Adduction  3 x 10  Supine Shoulder Flexion/ Extension with wand and #3 AW 3 x 10  -Pt able to reach about 120 degrees  Supine Shoulder ER in 30 degrees scaption with wand 3 x 10  Supine Shoulder ER at 0 degrees abduction with palms up and  with wand 3 x 10  Supine Shoulder Abduction with wand 3 x 10  Seated Should Abduction table Slides 3 x 10  Seated Shoulder Flexion table Slides 3 x 10   04/04/23: All exercises performed on LUE  Scap Squeezes with 3 sec isometric hold 3 x 10 Supine Shoulder ER PROM to 40 deg at 0 deg abduction 3 x 10  Supine Shoulder Abduction to 60 deg 3 x 10   Supine Shoulder Flexion PROM to 55 deg 3 x 10  -min VC to flex arm as far as possible   03/20/23: All exercises performed on LUE  Shoulder PROM  -Flex R/L 170/65 -Abd R/L 180/40  -ER at 0 deg abd R/L 90/15   Supine Shoulder Flex PROM 1 x 10 Supine Shoulder Abd PROM 1 x 10  Supine Shoulder IR/ER at 0 deg abd 1 x 10   Pendulums Forward/Backward 1 x 10  Pendulums Side to Side 1 x 10   03/18/23 Review of biceps tendinosis protocol with emphasis on precautions.     PATIENT EDUCATION: Education details: HEP, goals, POC Person educated: Patient Education method: Explanation, Demonstration, and Handouts Education comprehension: verbalized understanding and returned demonstration   HOME EXERCISE PROGRAM: Access Code: [email or web address] URL: [email or web address] Date: 04/10/2023 Prepared by: Ellin Goodie  Exercises - Seated  Shoulder Flexion AAROM with Pulley Behind  - 1 x daily - 3 sets - 10 reps - Seated Shoulder Abduction AAROM with Pulley Behind  - 1 x daily - 3 sets - 10 reps - Scare the Bear   - 1 x daily - 3 sets - 10 reps - Supine Shoulder External Rotation AAROM with Dowel  - 1 x daily - 3 sets - 10 reps - Supine Shoulder Abduction AAROM with Dowel  - 1 x daily - 3 sets - 10 reps - Supine Shoulder External Rotation with Dowel  - 1 x daily - 3 sets - 10 reps - Seated Shoulder Abduction Towel Slide  at Table Top  - 1 x daily - 3 sets - 10 reps - Seated Shoulder Flexion Towel Slide at Table Top  - 1 x daily - 3 sets - 10 reps - Seated Scapular Retraction  - 1 x daily - 3 sets - 10 reps   ASSESSMENT:   CLINICAL IMPRESSION: Continued to follow protocol. ROM improving. Pain remains at goal in shoulder and intermittent Left radial nerve pain. Patient will continue to  benefit from skilled therapy to address deficits in order to improve quality of life and return to PLOF while following protocol provided by MD.     OBJECTIVE IMPAIRMENTS: decreased activity tolerance, decreased mobility, decreased ROM, decreased strength, hypomobility, impaired flexibility, impaired sensation, impaired UE functional use, improper body mechanics, and pain.    ACTIVITY LIMITATIONS: carrying, lifting, bathing, toileting, dressing, reach over head, and hygiene/grooming   PARTICIPATION LIMITATIONS: meal prep, cleaning, laundry, driving, community activity, occupation, and yard work   PERSONAL FACTORS: Age, Fitness, Profession, and Time since onset of injury/illness/exacerbation are also affecting patient's functional outcome.    REHAB POTENTIAL: Good   CLINICAL DECISION MAKING: Stable/uncomplicated   EVALUATION COMPLEXITY: Low     GOALS: Goals reviewed with patient? Yes   SHORT TERM GOALS: Target date: 04/11/2023   Patient will be independent in home exercise program to improve strength/mobility for better functional independence with ADLs. Baseline: 5/16: HEP given Goal status: Ongoing   2. Patient will demonstrate knowledge of how to correctly don and doff left shoulder brace in order to be able to do it after surgery.             Baseline: NT 03/18/23: Pt demonstrates understanding             Goal status: Achieved      LONG TERM GOALS: Target date: 06/06/2023     Patient will increase FOTO score to equal to or greater than 50 to demonstrate statistically significant improvement in mobility and  quality of life. Baseline: 5/16: 34 Goal status: Ongoing    2.  Patient will show symmetrical strength between left shoulder with right shoulder to regain ability to perform reaching tasks for his job  Baseline: see above Goal status: Ongoing    3.  Patient will improve AROM UE so they are able to perform typical job tasks and overhead ADL's such as reaching into cabinets. Baseline: see above Goal status: Ongoing    4.  Patient will report a worst pain of 3/10 on NPRS in to improve tolerance with ADLs and reduced symptoms with activities. Baseline: see above Goal status: Ongoing        PLAN:   PT FREQUENCY: 1-2x/week   PT DURATION: 12 weeks   PLANNED INTERVENTIONS: Therapeutic exercises, Therapeutic activity, Neuromuscular re-education, Balance training, Gait training, Patient/Family education, Self Care, Joint mobilization, Joint manipulation,  Cryotherapy, Moist heat, and Manual therapy   PLAN FOR NEXT SESSION: Take FOTO. Check that pt understands HEP and have him perform them to demonstrate understanding. Review if he struggles to perform and can perform grade I and II shoulder mobilizations.  DO NOT BEGIN BICEPS STRENGTHENING UNTIL WEEK 8.      04/25/2023, 3:35 PM  3:35 PM, 04/25/23 Rosamaria Lints, PT, DPT Physical Therapist - [place] [phone] (Office)

## 2023-04-30 ENCOUNTER — Encounter: Payer: Self-pay | Admitting: Physical Therapy

## 2023-04-30 ENCOUNTER — Ambulatory Visit: Payer: Medicare Other | Attending: Physician Assistant | Admitting: Physical Therapy

## 2023-04-30 DIAGNOSIS — M25632 Stiffness of left wrist, not elsewhere classified: Secondary | ICD-10-CM | POA: Insufficient documentation

## 2023-04-30 DIAGNOSIS — M25512 Pain in left shoulder: Secondary | ICD-10-CM | POA: Insufficient documentation

## 2023-04-30 DIAGNOSIS — M6281 Muscle weakness (generalized): Secondary | ICD-10-CM | POA: Diagnosis present

## 2023-04-30 DIAGNOSIS — T148XXA Other injury of unspecified body region, initial encounter: Secondary | ICD-10-CM | POA: Diagnosis present

## 2023-04-30 DIAGNOSIS — M79602 Pain in left arm: Secondary | ICD-10-CM | POA: Diagnosis present

## 2023-04-30 DIAGNOSIS — M25642 Stiffness of left hand, not elsewhere classified: Secondary | ICD-10-CM | POA: Diagnosis present

## 2023-04-30 NOTE — Therapy (Signed)
OUTPATIENT PHYSICAL THERAPY TREATMENT    Patient Name: Jimmy Montoya MRN: [id number] DOB:01/20/1956, 67 y.o., male Today's Date: 04/30/2023  PCP: Dr. Evan Mackie Nones  REFERRING PROVIDER: Dr. Massie Bougie   END OF SESSION:   PT End of Session - 04/30/23 1438     Visit Number 8    Number of Visits 24    Date for PT Re-Evaluation 06/06/23    Authorization Type [employer]    Authorization - Visit Number 8    Authorization - Number of Visits 20    Progress Note Due on Visit 10    PT Start Time 1435    PT Stop Time 1515    PT Time Calculation (min) 40 min    Activity Tolerance Patient tolerated treatment well;No increased pain    Behavior During Therapy [facility] for tasks assessed/performed               Past Medical History:  Diagnosis Date   Anemia 03/20/2021   Flu 09/29/2015   Hernia, inguinal, right 09/02/2015   Hyperlipemia    Hypertension    Kidney stone    Past Surgical History:  Procedure Laterality Date   HERNIA REPAIR     INGUINAL HERNIA REPAIR Right 12/23/2015   Procedure: HERNIA REPAIR INGUINAL ADULT;  Surgeon: Nadeen Landau, MD;  Location: [facility] ORS;  Service: General;  Laterality: Right;   Patient Active Problem List   Diagnosis Date Noted   Acute CVA (cerebrovascular accident) (HCC) 03/20/2021   HTN (hypertension) 03/20/2021   HLD (hyperlipidemia) 03/20/2021   Anemia 03/20/2021   Hypokalemia 03/20/2021    REFERRING DIAG: s/p L shoulder arthroscopy, RC repair and biceps tenodesis   THERAPY DIAG:  Pain in left arm  Muscle weakness (generalized)  Rationale for Evaluation and Treatment Rehabilitation  PERTINENT HISTORY: Per Dr. Binnie Rail note (02/01/23), L shoulder injury occurred 1 week prior while holding reigns of a horse and horse suddenly reared causing L shoulder to dislocate which was reduced under conscious sedation. Underwent EMG/NVC and MRI. MRI found L rotator cuff tear. S/p L rotator cuff repair and biceps tenodesis on 03/12/23.    PRECAUTIONS: No AROM of the elbow or shoulder                              No Shoulder ER beyond 40 deg                              No Shoulder Ext or horizontal abduction past neutral                             No lifting objects                              Place a towel roll or pillow under elbow while laying supine to avoid shoulder ext                             No friction massage of surgical site    SUBJECTIVE:  SUBJECTIVE STATEMENT:  Pt reports that his shoulder is feeling better now that it is no longer in a sling.     PAIN:  Are you having pain? None on arrival around   OBJECTIVE:              TODAY'S TREATMENT:                                                                                                                                         DATE:   04/30/23: All single arm exercises performed on LUE  Shoulder pulleys flexion/extension AAROM 3 x 10  Shoulder pulleys abduction/adduction AAROM 3 x 10  Seated Shoulder Flex AAROM with wand 1 x 10  -Pt only able to raise arm to 70 deg flex  Supine Shoulder Flex AAROM with #AW 1 x 10 to 120 deg flex  -Pt able to reach 120 left shoulder flexion PROM  Shoulder Flex Isometric 3 sec hold 1 x 10  -Pt unable to complete full sets because of swollen left hand  Shoulder Abd Isometric 3 sec hold 2 x 10  Shoulder Ext Isometric 3 sec hold 2 x 10  Shoulder ER Isometric 3 sec hold 2 x 10 -Pt reports increased pain on dorsal side of hand  Shoulder Flexion Isometric into palm of opposite hand 3 sec hold 1 x 10  Shoulder ER Isometric into palm of opposite hand 3 sec hold 1 x 10   04/25/23: -heat applied to shoulder for mobility  -forward table slides 1x20x1secH, then 5x30secH  -Scaption table slides 5x30secH -ABDCT table slides 3x60sec  -LAD traction  x5 minutes, variable ABDCT/scaption angle  -Left pec major/minor release x3 minutes   ----------------------------------- 04/23/23 -AA/ROM with pulleys: LLE flexion x30 fast, 3x30sec sustained; ABDCT attempted, but plane is awkward and limited (more scaption than ABDCT) -supine LLE P/ROM flexion 8x30sec H; ABCT 8x30sec H  -Left elbow flexion/extension AA/ROM or P/ROM 1x20 -LLE ER wand, (40 degrees ABDCT, 0 degrees flexion) 1x20, then 4x30sec  -Left UT release with A/ROM rotation in supine   ------------------  6/12: All exercise perform on LUE  AAROM Shoulder Flexion/Extension 3 x 10  AAROM Shoulder Abdution/Adduction 3 x 10  Supine Shoulder Flexion/ Extension with wand and #3 AW 3 x 10  -Pt able to reach about 120 degrees  Supine Shoulder ER in 30 degrees scaption with wand 3 x 10  Supine Shoulder ER at 0 degrees abduction with palms up and  with wand 3 x 10  Supine Shoulder Abduction with wand 3 x 10  Seated Should Abduction table Slides 3 x 10  Seated Shoulder Flexion table Slides 3 x 10   04/04/23: All exercises performed on LUE  Scap Squeezes with 3 sec isometric hold 3 x 10 Supine Shoulder ER PROM to 40 deg at 0 deg abduction 3 x 10  Supine Shoulder Abduction to 60 deg 3 x  10   Supine Shoulder Flexion PROM to 55 deg 3 x 10  -min VC to flex arm as far as possible   03/20/23: All exercises performed on LUE  Shoulder PROM  -Flex R/L 170/65 -Abd R/L 180/40  -ER at 0 deg abd R/L 90/15   Supine Shoulder Flex PROM 1 x 10 Supine Shoulder Abd PROM 1 x 10  Supine Shoulder IR/ER at 0 deg abd 1 x 10   Pendulums Forward/Backward 1 x 10  Pendulums Side to Side 1 x 10   03/18/23 Review of biceps tendinosis protocol with emphasis on precautions.     PATIENT EDUCATION: Education details: HEP, goals, POC Person educated: Patient Education method: Explanation, Demonstration, and Handouts Education comprehension: verbalized understanding and returned demonstration   HOME EXERCISE  PROGRAM: Access Code: [email or web address] URL: [email or web address] Date: 04/30/2023 Prepared by: Ellin Goodie  Exercises - Seated Shoulder Flexion AAROM with Pulley Behind  - 1 x daily - 3 sets - 10 reps - Seated Shoulder Abduction AAROM with Pulley Behind  - 1 x daily - 3 sets - 10 reps - Scare the Bear   - 1 x daily - 3 sets - 10 reps - Supine Shoulder External Rotation AAROM with Dowel  - 1 x daily - 3 sets - 10 reps - Isometric Shoulder Abduction at Wall  - 3-4 x weekly - 3 sets - 10 reps - 3 sec  hold - Isometric Shoulder Flexion  - 3-4 x weekly - 3 sets - 10 reps - 3 sec  hold - Isometric Shoulder Abduction  - 3-4 x weekly - 3 sets - 10 reps - Isometric Shoulder Extension at Wall  - 3-4 x weekly - 3 sets - 10 reps - 3 sec  hold - Standing Isometric Shoulder Internal Rotation at Doorway  - 3-4 x weekly - 3 sets - 10 reps - 3 sec hold - Supine Shoulder External Rotation with Dowel  - 1 x daily - 3 sets - 10 reps - Seated Shoulder Abduction Towel Slide at Table Top  - 1 x daily - 3 sets - 10 reps - Seated Shoulder Flexion Towel Slide at Table Top  - 1 x daily - 3 sets - 10 reps   ASSESSMENT:   CLINICAL IMPRESSION: Pt is now 7 weeks s/p left shoulder RTC debridement. Educated pt on performing isometrics which he was able to do with no increase in his pain with the exception of shoulder external rotation and flexion into wall as his right hand had considerable swelling. Modified these exercises to have pt perform them by pushing into his hand instead of the wall, which was less painful. He will continue to  benefit from skilled therapy to address deficits in order to improve quality of life and return to PLOF while following protocol provided by MD.  OBJECTIVE IMPAIRMENTS: decreased activity tolerance, decreased mobility, decreased ROM, decreased strength, hypomobility, impaired flexibility, impaired sensation, impaired UE functional use, improper body mechanics, and pain.     ACTIVITY LIMITATIONS: carrying, lifting, bathing, toileting, dressing, reach over head, and hygiene/grooming   PARTICIPATION LIMITATIONS: meal prep, cleaning, laundry, driving, community activity, occupation, and yard work   PERSONAL FACTORS: Age, Fitness, Profession, and Time since onset of injury/illness/exacerbation are also affecting patient's functional outcome.    REHAB POTENTIAL: Good   CLINICAL DECISION MAKING: Stable/uncomplicated   EVALUATION COMPLEXITY: Low     GOALS: Goals reviewed with patient? Yes   SHORT TERM GOALS: Target date: 04/11/2023   Patient will be  independent in home exercise program to improve strength/mobility for better functional independence with ADLs. Baseline: 5/16: HEP given Goal status: Ongoing   2. Patient will demonstrate knowledge of how to correctly don and doff left shoulder brace in order to be able to do it after surgery.             Baseline: NT 03/18/23: Pt demonstrates understanding             Goal status: Achieved      LONG TERM GOALS: Target date: 06/06/2023     Patient will increase FOTO score to equal to or greater than 50 to demonstrate statistically significant improvement in mobility and quality of life. Baseline: 5/16: 34 Goal status: Ongoing    2.  Patient will show symmetrical strength between left shoulder with right shoulder to regain ability to perform reaching tasks for his job  Baseline: see above Goal status: Ongoing    3.  Patient will improve AROM UE so they are able to perform typical job tasks and overhead ADL's such as reaching into cabinets. Baseline: see above Goal status: Ongoing    4.  Patient will report a worst pain of 3/10 on NPRS in to improve tolerance with ADLs and reduced symptoms with activities. Baseline: see above Goal status: Ongoing        PLAN:   PT FREQUENCY: 1-2x/week   PT DURATION: 12 weeks   PLANNED INTERVENTIONS: Therapeutic exercises, Therapeutic activity, Neuromuscular  re-education, Balance training, Gait training, Patient/Family education, Self Care, Joint mobilization, Joint manipulation, Cryotherapy, Moist heat, and Manual therapy   PLAN FOR NEXT SESSION: Take FOTO. Shoulder flexion in scapular plane and shoulder abduction arom.  DO NOT BEGIN BICEPS STRENGTHENING UNTIL WEEK 8.     Ellin Goodie PT, DPT  [facility] [address], [place], [address] Phone: [phone]   Fax:  [phone]

## 2023-05-07 ENCOUNTER — Ambulatory Visit: Payer: Medicare Other | Admitting: Physical Therapy

## 2023-05-07 ENCOUNTER — Encounter: Payer: Self-pay | Admitting: Physical Therapy

## 2023-05-07 DIAGNOSIS — M79602 Pain in left arm: Secondary | ICD-10-CM | POA: Diagnosis not present

## 2023-05-07 DIAGNOSIS — M6281 Muscle weakness (generalized): Secondary | ICD-10-CM

## 2023-05-07 NOTE — Therapy (Signed)
OUTPATIENT PHYSICAL THERAPY TREATMENT    Patient Name: Jimmy Montoya MRN: [id number] DOB:11/25/1955, 67 y.o., male Today's Date: 05/07/2023  PCP: Dr. Devoiry Corriher Nones  REFERRING PROVIDER: Dr. Massie Bougie   END OF SESSION:   PT End of Session - 05/07/23 1348     Visit Number 9    Number of Visits 24    Date for PT Re-Evaluation 06/06/23    Authorization Type [employer]    Authorization - Visit Number 9    Authorization - Number of Visits 20    Progress Note Due on Visit 10    PT Start Time 1345    PT Stop Time 1430    PT Time Calculation (min) 45 min    Activity Tolerance Patient tolerated treatment well;No increased pain    Behavior During Therapy [facility] for tasks assessed/performed               Past Medical History:  Diagnosis Date   Anemia 03/20/2021   Flu 09/29/2015   Hernia, inguinal, right 09/02/2015   Hyperlipemia    Hypertension    Kidney stone    Past Surgical History:  Procedure Laterality Date   HERNIA REPAIR     INGUINAL HERNIA REPAIR Right 12/23/2015   Procedure: HERNIA REPAIR INGUINAL ADULT;  Surgeon: Nadeen Landau, MD;  Location: [facility] ORS;  Service: General;  Laterality: Right;   Patient Active Problem List   Diagnosis Date Noted   Acute CVA (cerebrovascular accident) (HCC) 03/20/2021   HTN (hypertension) 03/20/2021   HLD (hyperlipidemia) 03/20/2021   Anemia 03/20/2021   Hypokalemia 03/20/2021    REFERRING DIAG: s/p L shoulder arthroscopy, RC repair and biceps tenodesis   THERAPY DIAG:  Pain in left arm  Muscle weakness (generalized)  Rationale for Evaluation and Treatment Rehabilitation  PERTINENT HISTORY: Per Dr. Binnie Rail note (02/01/23), L shoulder injury occurred 1 week prior while holding reigns of a horse and horse suddenly reared causing L shoulder to dislocate which was reduced under conscious sedation. Underwent EMG/NVC and MRI. MRI found L rotator cuff tear. S/p L rotator cuff repair and biceps tenodesis on 03/12/23.    PRECAUTIONS: No AROM of the elbow or shoulder                              No Shoulder ER beyond 40 deg                              No Shoulder Ext or horizontal abduction past neutral                             No lifting objects                              Place a towel roll or pillow under elbow while laying supine to avoid shoulder ext                             No friction massage of surgical site    SUBJECTIVE:  SUBJECTIVE STATEMENT:  Pt reports that his shoulder is feeling better now that it is no longer in a sling.     PAIN:  Are you having pain? None on arrival around   OBJECTIVE:              TODAY'S TREATMENT:                                                                                                                                         DATE:   05/07/23: All single arm exercises performed on LUE UBE seat 7 (2.5 forward and 2.5 backward) 5 min  OMEGA Seated Rows #15 3 x 10  -Pt does not fully grip left handle  -min VC and TC for proper sequence of exercise  Shoulder Flexion/Extension AAROM Pulleys 3 x 10  Shoulder Abduction/Adduction AAROM Pulleys 3 x 10  Shoulder Flexion/Extension AAROM railing slides 3 x 10  Shoulder Abduction/Adduction AAROM railing slides 3 x 10  Shoulder at 0 deg abduction ER isometric 3 sec hold 1 x 10  Shoulder at 0 deg abduction IR isometric 3 sec hold 1 x 10   04/30/23: All single arm exercises performed on LUE  Shoulder pulleys flexion/extension AAROM 3 x 10  Shoulder pulleys abduction/adduction AAROM 3 x 10  Seated Shoulder Flex AAROM with wand 1 x 10  -Pt only able to raise arm to 70 deg flex  Supine Shoulder Flex AAROM with #AW 1 x 10 to 120 deg flex  -Pt able to reach 120 left shoulder flexion PROM  Shoulder Flex Isometric 3 sec hold 1 x 10  -Pt  unable to complete full sets because of swollen left hand  Shoulder Abd Isometric 3 sec hold 2 x 10  Shoulder Ext Isometric 3 sec hold 2 x 10  Shoulder ER Isometric 3 sec hold 2 x 10 -Pt reports increased pain on dorsal side of hand  Shoulder Flexion Isometric into palm of opposite hand 3 sec hold 1 x 10  Shoulder ER Isometric into palm of opposite hand 3 sec hold 1 x 10   04/25/23: -heat applied to shoulder for mobility  -forward table slides 1x20x1secH, then 5x30secH  -Scaption table slides 5x30secH -ABDCT table slides 3x60sec  -LAD traction x5 minutes, variable ABDCT/scaption angle  -Left pec major/minor release x3 minutes   ----------------------------------- 04/23/23 -AA/ROM with pulleys: LLE flexion x30 fast, 3x30sec sustained; ABDCT attempted, but plane is awkward and limited (more scaption than ABDCT) -supine LLE P/ROM flexion 8x30sec H; ABCT 8x30sec H  -Left elbow flexion/extension AA/ROM or P/ROM 1x20 -LLE ER wand, (40 degrees ABDCT, 0 degrees flexion) 1x20, then 4x30sec  -Left UT release with A/ROM rotation in supine    PATIENT EDUCATION: Education details: HEP, goals, POC Person educated: Patient Education method: Explanation, Demonstration, and Handouts Education comprehension: verbalized understanding and returned demonstration   HOME EXERCISE PROGRAM: Access Code: [email or web address] URL: [email or web address] Date:  05/07/2023 Prepared by: Ellin Goodie  Exercises - Seated Shoulder Flexion AAROM with Pulley Behind  - 1 x daily - 3 sets - 10 reps - Seated Shoulder Abduction AAROM with Pulley Behind  - 1 x daily - 3 sets - 10 reps - Scare the Bear   - 1 x daily - 3 sets - 10 reps - Supine Shoulder External Rotation AAROM with Dowel  - 1 x daily - 3 sets - 10 reps - Isometric Shoulder Extension at Wall  - 3-4 x weekly - 3 sets - 10 reps - 3 sec  hold - Standing Isometric Shoulder Internal Rotation at Doorway  - 3-4 x weekly - 3 sets - 10 reps - 3 sec hold -  Supine Shoulder External Rotation with Dowel  - 1 x daily - 3 sets - 10 reps - Seated Shoulder Abduction Towel Slide at Table Top  - 1 x daily - 3 sets - 10 reps - Seated Shoulder Flexion Towel Slide at Table Top  - 1 x daily - 3 sets - 10 reps - Seated Isometric Shoulder Internal Rotation with Towel  - 1 x daily - 3 sets - 10 reps - 3 sec  hold - Isometric Shoulder External Rotation  - 1 x daily - 3 sets - 10 reps - 3 sec  hold   ASSESSMENT:   CLINICAL IMPRESSION: Pt is now 8 weeks s/p left shoulder RTC debridement. Pt continues to show limitations with AROM in shoulder flexion and abduction. Focused session on mobility exercises and introduced shoulder ER and IR isometrics. He is now in phase III of protocol with emphasis on regaining shoulder arom. He will continue to  benefit from skilled therapy to address deficits in order to improve quality of life and return to PLOF while following protocol provided by MD.  OBJECTIVE IMPAIRMENTS: decreased activity tolerance, decreased mobility, decreased ROM, decreased strength, hypomobility, impaired flexibility, impaired sensation, impaired UE functional use, improper body mechanics, and pain.    ACTIVITY LIMITATIONS: carrying, lifting, bathing, toileting, dressing, reach over head, and hygiene/grooming   PARTICIPATION LIMITATIONS: meal prep, cleaning, laundry, driving, community activity, occupation, and yard work   PERSONAL FACTORS: Age, Fitness, Profession, and Time since onset of injury/illness/exacerbation are also affecting patient's functional outcome.    REHAB POTENTIAL: Good   CLINICAL DECISION MAKING: Stable/uncomplicated   EVALUATION COMPLEXITY: Low     GOALS: Goals reviewed with patient? Yes   SHORT TERM GOALS: Target date: 04/11/2023   Patient will be independent in home exercise program to improve strength/mobility for better functional independence with ADLs. Baseline: 5/16: HEP given Goal status: Ongoing   2. Patient will  demonstrate knowledge of how to correctly don and doff left shoulder brace in order to be able to do it after surgery.             Baseline: NT 03/18/23: Pt demonstrates understanding             Goal status: Achieved      LONG TERM GOALS: Target date: 06/06/2023     Patient will increase FOTO score to equal to or greater than 50 to demonstrate statistically significant improvement in mobility and quality of life. Baseline: 5/16: 34 Goal status: Ongoing    2.  Patient will show symmetrical strength between left shoulder with right shoulder to regain ability to perform reaching tasks for his job  Baseline: see above Goal status: Ongoing    3.  Patient will improve AROM UE so they are  able to perform typical job tasks and overhead ADL's such as reaching into cabinets. Baseline: see above Goal status: Ongoing    4.  Patient will report a worst pain of 3/10 on NPRS in to improve tolerance with ADLs and reduced symptoms with activities. Baseline: see above Goal status: Ongoing        PLAN:   PT FREQUENCY: 1-2x/week   PT DURATION: 12 weeks   PLANNED INTERVENTIONS: Therapeutic exercises, Therapeutic activity, Neuromuscular re-education, Balance training, Gait training, Patient/Family education, Self Care, Joint mobilization, Joint manipulation, Cryotherapy, Moist heat, and Manual therapy   PLAN FOR NEXT SESSION: Reassess goals. Posterior capsule stretch and continue with AROM exerciss.    Ellin Goodie PT, DPT  [facility] [address], [place], [address] Phone: [phone]   Fax:  [phone]

## 2023-05-09 ENCOUNTER — Ambulatory Visit: Payer: Medicare Other | Admitting: Occupational Therapy

## 2023-05-09 ENCOUNTER — Ambulatory Visit: Payer: Medicare Other | Admitting: Physical Therapy

## 2023-05-09 DIAGNOSIS — M79602 Pain in left arm: Secondary | ICD-10-CM

## 2023-05-09 DIAGNOSIS — M25642 Stiffness of left hand, not elsewhere classified: Secondary | ICD-10-CM

## 2023-05-09 DIAGNOSIS — M6281 Muscle weakness (generalized): Secondary | ICD-10-CM

## 2023-05-09 DIAGNOSIS — M25632 Stiffness of left wrist, not elsewhere classified: Secondary | ICD-10-CM

## 2023-05-09 DIAGNOSIS — T148XXA Other injury of unspecified body region, initial encounter: Secondary | ICD-10-CM

## 2023-05-09 DIAGNOSIS — M25512 Pain in left shoulder: Secondary | ICD-10-CM

## 2023-05-09 NOTE — Therapy (Signed)
[facility] [address] [place], [address] Phone: [phone]   Fax:  [phone]  Occupational Therapy Treatment  Patient Details  Name: Jimmy Montoya MRN: [id number] Date of Birth: 04/06/1956 Referring Provider (OT): Dr Joice Lofts   Encounter Date: 05/09/2023   OT End of Session - 05/09/23 1438     Visit Number 15    Number of Visits 7    Date for OT Re-Evaluation 07/04/23    OT Start Time 1400    OT Stop Time 1428    OT Time Calculation (min) 28 min    Activity Tolerance Patient tolerated treatment well    Behavior During Therapy [facility] for tasks assessed/performed             Past Medical History:  Diagnosis Date   Anemia 03/20/2021   Flu 09/29/2015   Hernia, inguinal, right 09/02/2015   Hyperlipemia    Hypertension    Kidney stone     Past Surgical History:  Procedure Laterality Date   HERNIA REPAIR     INGUINAL HERNIA REPAIR Right 12/23/2015   Procedure: HERNIA REPAIR INGUINAL ADULT;  Surgeon: Nadeen Landau, MD;  Location: [facility] ORS;  Service: General;  Laterality: Right;    There were no vitals filed for this visit.   Subjective Assessment - 05/09/23 1412     Subjective  Doing better- using it more -still sweling every day and stiff - but getting better- surgeon said it is going to take time because of the nerve injury    Pertinent History 02/01/23 Ortho note with DR Poggi- Raybon Conard is a 67 y.o. male who presents for evaluation and treatment of a left shoulder injury which occurred 1 week ago. Apparently he was leaving a horse while holding onto the rains when the horse suddenly reared causing his shoulder to dislocate. He presented to the emergency room where the shoulder was reduced under conscious sedation. The patient notes that his shoulder is feeling reasonably comfortable but he has a lot of pain in the upper arm extending to the elbow region. He also notes numbness and tingling as well as  pain extending from his elbow down to his wrist. He has been wearing his shoulder immobilizer on a regular basis, removing it for bathing purposes. He rates his pain at 8/10 on today's visit and has been taking naproxen and oxycodone as necessary with temporary partial relief of his symptoms. He is right-hand dominant. He denies any prior dislocation or other injury to the left shoulder in the past.The patient is quite concerned by the neurologic deficits he continues to experience after his acute shoulder dislocation. It does appear that he has some deficits, primarily involving the radial and ulnar nerve distributions, although he also appears to have some deficits with the musculocutaneous and median nerves as well. Therefore, I have recommended that he undergo an EMG/NCV study of the left upper extremity to assess these nerves for evidence of damage and reinnervation. In addition, he will be started in occupational therapy to work on range of motion and strength exercises to the left upper extremity. Finally, he will be sent for an MRI scan of the left shoulder to evaluate for rotator cuff tear. Meanwhile, the patient will continue to wear his shoulder immobilizer on a regular basis, removing it for bathing purposes and for exercises. He may continue his present medication regimen, but is encouraged to wean off of his oxycodone as soon as possible.  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    Currently in Pain? Yes    Pain Score 5     Pain Location Shoulder    Pain Orientation Left    Pain Descriptors / Indicators Aching    Pain Type Surgical pain    Pain Onset More than a month ago    Pain Frequency Intermittent                [facility] OT Assessment - 05/09/23 0001       AROM   Left Wrist Extension 62 Degrees    Left Wrist Flexion 85 Degrees    Left Wrist Radial Deviation 25 Degrees    Left Wrist Ulnar  Deviation 35 Degrees      Strength   Right Hand Grip (lbs) 58    Right Hand Lateral Pinch 20 lbs    Left Hand Grip (lbs) 16    Left Hand Lateral Pinch 9 lbs    Left Hand 3 Point Pinch 11 lbs      Left Hand AROM   L Index  MCP 0-90 70 Degrees    L Index PIP 0-100 90 Degrees    L Long  MCP 0-90 75 Degrees    L Long PIP 0-100 95 Degrees    L Ring  MCP 0-90 80 Degrees    L Ring PIP 0-100 100 Degrees    L Little  MCP 0-90 80 Degrees    L Little PIP 0-100 100 Degrees               Patient arrived after not being seen for about 3 weeks.  Patient making great progress in wrist active range of motion since last time especially with wrist extension and then flexion to. Strength increased to 4+/5. Supination still decreased strength but improving. Edema decreases but still present at second metacarpal as well as thumb through third digit Also some nerve pain still present over these 3 digits. Fisting improved with composite flexion improving As well as grip and prehension strength improving. Patient to continue to work on contrast in the morning with composite fisting while maintaining extension Work on 2 pound weight for supination pronation As well as for medium green putty for gripping and prehension. Patient to follow-up with me in a month time.               OT Education - 05/09/23 1438     Education Details progress and changes to HEP    Person(s) Educated Patient    Methods Explanation;Demonstration;Tactile cues;Verbal cues;Handout    Comprehension Verbal cues required;Returned demonstration;Verbalized understanding                 OT Long Term Goals - 05/09/23 1443       OT LONG TERM GOAL #1   Title Patient wife independent in home program to decrease edema and pain less 3-4/10 at rest show increased elbow, wrist and digit active into motion    Status Achieved      OT LONG TERM GOAL #2   Title L Elbow flexion and extension increased to within  normal limits for patient able to initiate washing face and put hand through sleeve if    Status Deferred      OT LONG TERM GOAL #3   Title L wrist  and forearm ROM and strength increase to 4-/5  to turn doorknob and push hand thru sleeve    Status Achieved      OT LONG TERM GOAL #4   Title L hand digits increase for pt to touch palm and digits extention WNL to donn gloves    Status Achieved      OT LONG TERM GOAL #5   Title L thumb AROM increase to WNL for pt to hold cup and turn doorknob    Status Achieved      Long Term Additional Goals   Additional Long Term Goals Yes      OT LONG TERM GOAL #6   Title L grip and prehension strenght increase to more than 60% compare to R hand to use hand in ADlL's more than 90%    Baseline GRip and prehension increase but still decrease compare to R - Grip R 58; L 16 lbs, Lat grip R 20 ,L 9 and L 3 point 11 lbs    Time 8    Period Weeks    Status New    Target Date 07/04/23                   Plan - 05/09/23 1439     Clinical Impression Statement Pt was refer to OT with diagnosis of L shoulder anterior dislocation on 01/24/23. Pt is R hand dominant. Pt was seen for 9 visits prior to shoulder and bicep surgery 03/12/23 by DR Dewaine Conger. Prior to surgery pt made great progress  in elbow, forearm, wrist  and digits AROM - pain improved from 7-9/10 to 1/10 -only now more tingling on dorsal radial hand . Pt cont to have  edema and stiffness in L hand but mostly over 2nd MC and thumb thru 3rd digits. Pt follow up after 3 wks doing HEP - great progress in edema over ulnar side of hand , increase wrist flexion and extention- increase strength. As well as increase grip and prehension strength.   Reinforce again for pt to wear his isotoner glove, Massage and PROM composite fist. in the am - prior to using hand during day and doing putty.   Pt to wear his CMC neoprene with gripping of tools to increase thumb RA and PA. USed Benik as needed for wrist when using  tools in shop.  Pt can do HEP with med firm putty - gripping and lat/3 point pinch - but keep pain free and not compensate with wrist flexion.  Pt can benefit from skilled OT services to decrease edema, stiffness, pain and increase AROM and strength in L hand/ wrist  to be able to use L hand in functional tasks- pt had CVA in past but had full motion and strength with diminshed sensation in thumb and 2nd digit    OT Occupational Profile and History Problem Focused Assessment - Including review of records relating to presenting problem    Occupational performance deficits (Please refer to evaluation for details): ADL's;IADL's;Rest and Sleep;Work;Play;Social Participation;Leisure    Body Structure / Function / Physical Skills ADL;Edema;Dexterity;Decreased knowledge of use of DME;Decreased knowledge of precautions;Flexibility;ROM;UE functional use;FMC;Sensation;Pain;Strength;IADL;Proprioception;Coordination    Rehab Potential Fair    Clinical Decision Making Several treatment options, min-mod task modification necessary    Comorbidities Affecting Occupational Performance: May have comorbidities impacting occupational performance    Modification or Assistance to Complete Evaluation  Min-Moderate modification of tasks or assist with assess necessary to complete eval    OT Frequency Monthly    OT Duration 8 weeks    OT  Treatment/Interventions Self-care/ADL training;Moist Heat;Paraffin;Fluidtherapy;Contrast Bath;DME and/or AE instruction;Therapeutic exercise;Splinting;Patient/family education;Therapeutic activities;Manual Therapy;Passive range of motion    Consulted and Agree with Plan of Care Patient;Family member/caregiver             Patient will benefit from skilled therapeutic intervention in order to improve the following deficits and impairments:   Body Structure / Function / Physical Skills: ADL, Edema, Dexterity, Decreased knowledge of use of DME, Decreased knowledge of precautions,  Flexibility, ROM, UE functional use, FMC, Sensation, Pain, Strength, IADL, Proprioception, Coordination       Visit Diagnosis: Stiffness of left hand, not elsewhere classified  Muscle weakness (generalized)  Nerve injury  Stiffness of left wrist, not elsewhere classified    Problem List Patient Active Problem List   Diagnosis Date Noted   Acute CVA (cerebrovascular accident) (HCC) 03/20/2021   HTN (hypertension) 03/20/2021   HLD (hyperlipidemia) 03/20/2021   Anemia 03/20/2021   Hypokalemia 03/20/2021    Oletta Cohn, OTR/L,CLT 05/09/2023, 3:28 PM  [facility] [address] [place], [address] Phone: [phone]   Fax:  [phone]  Name: Amair Shrout MRN: [id number] Date of Birth: [birth year]

## 2023-05-09 NOTE — Therapy (Signed)
OUTPATIENT PHYSICAL THERAPY PROGRESS NOTE    Patient Name: Jimmy Montoya MRN: [id number] DOB:09/07/1956, 67 y.o., male Today's Date: 05/09/2023  PCP: Dr. Kenleigh Toback Nones  REFERRING PROVIDER: Dr. Massie Bougie   END OF SESSION:   PT End of Session - 05/09/23 1434     Visit Number 10    Number of Visits 24    Date for PT Re-Evaluation 06/06/23    Authorization Type [employer]IC[employer]    Authorization - Visit Number 10    Authorization - Number of Visits 20    Progress Note Due on Visit 10    PT Start Time 1430    PT Stop Time 1515    PT Time Calculation (min) 45 min    Activity Tolerance Patient tolerated treatment well;No increased pain    Behavior During Therapy [facility] for tasks assessed/performed               Past Medical History:  Diagnosis Date   Anemia 03/20/2021   Flu 09/29/2015   Hernia, inguinal, right 09/02/2015   Hyperlipemia    Hypertension    Kidney stone    Past Surgical History:  Procedure Laterality Date   HERNIA REPAIR     INGUINAL HERNIA REPAIR Right 12/23/2015   Procedure: HERNIA REPAIR INGUINAL ADULT;  Surgeon: Nadeen Landau, MD;  Location: [facility] ORS;  Service: General;  Laterality: Right;   Patient Active Problem List   Diagnosis Date Noted   Acute CVA (cerebrovascular accident) (HCC) 03/20/2021   HTN (hypertension) 03/20/2021   HLD (hyperlipidemia) 03/20/2021   Anemia 03/20/2021   Hypokalemia 03/20/2021    REFERRING DIAG: s/p L shoulder arthroscopy, RC repair and biceps tenodesis   THERAPY DIAG:  Pain in left arm  Muscle weakness (generalized)  Acute pain of left shoulder  Rationale for Evaluation and Treatment Rehabilitation  PERTINENT HISTORY: Per Dr. Binnie Rail note (02/01/23), L shoulder injury occurred 1 week prior while holding reigns of a horse and horse suddenly reared causing L shoulder to dislocate which was reduced under conscious sedation. Underwent EMG/NVC and MRI. MRI found L rotator cuff tear. S/p L rotator cuff  repair and biceps tenodesis on 03/12/23.   PRECAUTIONS: No AROM of the elbow or shoulder                              No Shoulder ER beyond 40 deg                              No Shoulder Ext or horizontal abduction past neutral                             No lifting objects                              Place a towel roll or pillow under elbow while laying supine to avoid shoulder ext                             No friction massage of surgical site    SUBJECTIVE:  SUBJECTIVE STATEMENT:  Pt reports that he felt increased pain in left shoulder after trying to hedge using pole hedger.   PAIN:  Are you having pain? None on arrival around   OBJECTIVE:              TODAY'S TREATMENT:                                                                                                                                         DATE:   05/09/23: All single arm exercises performed on LUE UBE seat 7 (2.5 forward and 2.5 backward) 5 min OMEGA Seated Rows #20 3 x 10  -min VC to decrease speed for increased time under tension  Sleeper Stretch 4 x 30 sec  Shoulder Abduction AROM with red ball seated beside mat table 3 x 10  Shoulder Abduction AAROM Pulleys 3 x 10  FOTO: 35/100   Shoulder Flex R/L 4+/3- Shoulder Abd R/L 4+/3-    05/07/23: All single arm exercises performed on LUE UBE seat 7 (2.5 forward and 2.5 backward) 5 min  OMEGA Seated Rows #15 3 x 10  -Pt does not fully grip left handle  -min VC and TC for proper sequence of exercise  Shoulder Flexion/Extension AAROM Pulleys 3 x 10  Shoulder Abduction/Adduction AAROM Pulleys 3 x 10  Shoulder Flexion/Extension AAROM railing slides 3 x 10  Shoulder Abduction/Adduction AAROM railing slides 3 x 10  Shoulder at 0 deg abduction ER isometric 3 sec hold 1 x 10  Shoulder at  0 deg abduction IR isometric 3 sec hold 1 x 10   04/30/23: All single arm exercises performed on LUE  Shoulder pulleys flexion/extension AAROM 3 x 10  Shoulder pulleys abduction/adduction AAROM 3 x 10  Seated Shoulder Flex AAROM with wand 1 x 10  -Pt only able to raise arm to 70 deg flex  Supine Shoulder Flex AAROM with #AW 1 x 10 to 120 deg flex  -Pt able to reach 120 left shoulder flexion PROM  Shoulder Flex Isometric 3 sec hold 1 x 10  -Pt unable to complete full sets because of swollen left hand  Shoulder Abd Isometric 3 sec hold 2 x 10  Shoulder Ext Isometric 3 sec hold 2 x 10  Shoulder ER Isometric 3 sec hold 2 x 10 -Pt reports increased pain on dorsal side of hand  Shoulder Flexion Isometric into palm of opposite hand 3 sec hold 1 x 10  Shoulder ER Isometric into palm of opposite hand 3 sec hold 1 x 10   04/25/23: -heat applied to shoulder for mobility  -forward table slides 1x20x1secH, then 5x30secH  -Scaption table slides 5x30secH -ABDCT table slides 3x60sec  -LAD traction x5 minutes, variable ABDCT/scaption angle  -Left pec major/minor release x3 minutes    PATIENT EDUCATION: Education details: HEP, goals, POC Person educated: Patient Education method: Explanation, Demonstration, and Handouts Education comprehension:  verbalized understanding and returned demonstration   HOME EXERCISE PROGRAM: Access Code: [email or web address] URL: [email or web address] Date: 05/07/2023 Prepared by: Ellin Goodie  Exercises - Seated Shoulder Flexion AAROM with Pulley Behind  - 1 x daily - 3 sets - 10 reps - Seated Shoulder Abduction AAROM with Pulley Behind  - 1 x daily - 3 sets - 10 reps - Scare the Bear   - 1 x daily - 3 sets - 10 reps - Supine Shoulder External Rotation AAROM with Dowel  - 1 x daily - 3 sets - 10 reps - Isometric Shoulder Extension at Wall  - 3-4 x weekly - 3 sets - 10 reps - 3 sec  hold - Standing Isometric Shoulder Internal Rotation at Doorway  - 3-4 x  weekly - 3 sets - 10 reps - 3 sec hold - Supine Shoulder External Rotation with Dowel  - 1 x daily - 3 sets - 10 reps - Seated Shoulder Abduction Towel Slide at Table Top  - 1 x daily - 3 sets - 10 reps - Seated Shoulder Flexion Towel Slide at Table Top  - 1 x daily - 3 sets - 10 reps - Seated Isometric Shoulder Internal Rotation with Towel  - 1 x daily - 3 sets - 10 reps - 3 sec  hold - Isometric Shoulder External Rotation  - 1 x daily - 3 sets - 10 reps - 3 sec  hold   ASSESSMENT:   CLINICAL IMPRESSION: Pt is now 8 weeks s/p left shoulder RTC debridement. Pt presenting for progress note where he shows some improvement with shoulder strength compared to baseline. He continues to show decreased left shoulder AROM and strength, but this is appropriate for phase III portion of rehab that he is currently in. PT stressed not performing overhead activities especially with weight object until cleared during PT. He will continue to  benefit from skilled therapy to address deficits in order to improve quality of life and return to PLOF while following protocol provided by MD.  OBJECTIVE IMPAIRMENTS: decreased activity tolerance, decreased mobility, decreased ROM, decreased strength, hypomobility, impaired flexibility, impaired sensation, impaired UE functional use, improper body mechanics, and pain.    ACTIVITY LIMITATIONS: carrying, lifting, bathing, toileting, dressing, reach over head, and hygiene/grooming   PARTICIPATION LIMITATIONS: meal prep, cleaning, laundry, driving, community activity, occupation, and yard work   PERSONAL FACTORS: Age, Fitness, Profession, and Time since onset of injury/illness/exacerbation are also affecting patient's functional outcome.    REHAB POTENTIAL: Good   CLINICAL DECISION MAKING: Stable/uncomplicated   EVALUATION COMPLEXITY: Low     GOALS: Goals reviewed with patient? Yes   SHORT TERM GOALS: Target date: 04/11/2023   Patient will be independent in home  exercise program to improve strength/mobility for better functional independence with ADLs. Baseline: 5/16: HEP given  05/09/23: Able to perform independently  Goal status: Ongoing   2. Patient will demonstrate knowledge of how to correctly don and doff left shoulder brace in order to be able to do it after surgery.             Baseline: NT 03/18/23: Pt demonstrates understanding             Goal status: Achieved      LONG TERM GOALS: Target date: 06/06/2023     Patient will increase FOTO score to equal to or greater than 50 to demonstrate statistically significant improvement in mobility and quality of life. Baseline: 5/16: 34 Goal status: Ongoing    2.  Patient  will show symmetrical strength between left shoulder with right shoulder to regain ability to perform reaching tasks for his job  Baseline: see above Goal status: Ongoing    3.  Patient will improve AROM UE so they are able to perform typical job tasks and overhead ADL's such as reaching into cabinets. Baseline: see above  05/09/23: Still not reaching above his head except for exercises  Goal status: Ongoing    4.  Patient will report a worst pain of 3/10 on NPRS in to improve tolerance with ADLs and reduced symptoms with activities. Baseline: see above  05/09/23: 5/10 NRPS  Goal status: Ongoing        PLAN:   PT FREQUENCY: 1-2x/week   PT DURATION: 12 weeks   PLANNED INTERVENTIONS: Therapeutic exercises, Therapeutic activity, Neuromuscular re-education, Balance training, Gait training, Patient/Family education, Self Care, Joint mobilization, Joint manipulation, Cryotherapy, Moist heat, and Manual therapy   PLAN FOR NEXT SESSION: Shoulder Flex and Abduction in gravity eliminated positions. D2 PNF Flexion/Extension rhythmic stabilization. Continue to progress shoulder AROM and AAROM exercises.    Ellin Goodie PT, DPT  [facility] [address], [place], [address] Phone:  [phone]   Fax:  [phone]

## 2023-05-14 ENCOUNTER — Ambulatory Visit: Payer: Medicare Other | Admitting: Physical Therapy

## 2023-05-14 DIAGNOSIS — M79602 Pain in left arm: Secondary | ICD-10-CM | POA: Diagnosis not present

## 2023-05-14 DIAGNOSIS — M6281 Muscle weakness (generalized): Secondary | ICD-10-CM

## 2023-05-14 DIAGNOSIS — M25642 Stiffness of left hand, not elsewhere classified: Secondary | ICD-10-CM

## 2023-05-14 NOTE — Therapy (Signed)
OUTPATIENT PHYSICAL THERAPY TREATMENT NOTE    Patient Name: Jimmy Montoya MRN: [id number] DOB:[birth year], 67 y.o., male Today's Date: 05/14/2023  PCP: Dr. Keston Seever Nones  REFERRING PROVIDER: Dr. Massie Bougie   END OF SESSION:   PT End of Session - 05/14/23 1306     Visit Number 11    Number of Visits 24    Date for PT Re-Evaluation 06/06/23    Authorization Type [employer]    Authorization - Visit Number 11    Authorization - Number of Visits 20    Progress Note Due on Visit 10    PT Start Time 1305    PT Stop Time 1345    PT Time Calculation (min) 40 min    Activity Tolerance Patient tolerated treatment well;No increased pain    Behavior During Therapy [facility] for tasks assessed/performed                Past Medical History:  Diagnosis Date   Anemia 03/20/2021   Flu 09/29/2015   Hernia, inguinal, right 09/02/2015   Hyperlipemia    Hypertension    Kidney stone    Past Surgical History:  Procedure Laterality Date   HERNIA REPAIR     INGUINAL HERNIA REPAIR Right 12/23/2015   Procedure: HERNIA REPAIR INGUINAL ADULT;  Surgeon: Nadeen Landau, MD;  Location: [facility] ORS;  Service: General;  Laterality: Right;   Patient Active Problem List   Diagnosis Date Noted   Acute CVA (cerebrovascular accident) (HCC) 03/20/2021   HTN (hypertension) 03/20/2021   HLD (hyperlipidemia) 03/20/2021   Anemia 03/20/2021   Hypokalemia 03/20/2021    REFERRING DIAG: s/p L shoulder arthroscopy, RC repair and biceps tenodesis   THERAPY DIAG:  No diagnosis found.  Rationale for Evaluation and Treatment Rehabilitation  PERTINENT HISTORY: Per Dr. Binnie Rail note (02/01/23), L shoulder injury occurred 1 week prior while holding reigns of a horse and horse suddenly reared causing L shoulder to dislocate which was reduced under conscious sedation. Underwent EMG/NVC and MRI. MRI found L rotator cuff tear. S/p L rotator cuff repair and biceps tenodesis on 03/12/23.   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    PAIN:  Are you having pain? None on arrival around   OBJECTIVE:              TODAY'S TREATMENT:                                                                                                                                         DATE:   05/09/23: All single arm exercises performed on LUE UBE seat 7 (2.5 forward and 2.5 backward) 5 min OMEGA Seated Rows #20 3 x 10  -min VC to decrease speed for increased time under tension  Supine Shoulder Abduction with washcloth to decrease friction 3 x 30  Side Lying Shoulder Flexion with red ball roll on big mat 3 x 30  Shoulder Railing Trunk Flex to 45 deg on railing 3 x 10  -Mod VC to prevent trunk rotation  Side Lying D2 PNF Flexion and Extension 3 x 10  -intermittent PROM for NMR   05/09/23: All single arm exercises performed on LUE UBE seat 7 (2.5 forward and 2.5 backward) 5 min OMEGA Seated Rows #20 3 x 10  -min VC to decrease speed for increased time under tension  Sleeper Stretch 4 x 30 sec  Shoulder Abduction AROM with red ball seated beside mat table 3 x 10  Shoulder Abduction AAROM Pulleys 3 x 10  FOTO: 35/100   Shoulder Flex R/L 4+/3- Shoulder Abd R/L 4+/3-    05/07/23: All single arm  exercises performed on LUE UBE seat 7 (2.5 forward and 2.5 backward) 5 min  OMEGA Seated Rows #15 3 x 10  -Pt does not fully grip left handle  -min VC and TC for proper sequence of exercise  Shoulder Flexion/Extension AAROM Pulleys 3 x 10  Shoulder Abduction/Adduction AAROM Pulleys 3 x 10  Shoulder Flexion/Extension AAROM railing slides 3 x 10  Shoulder Abduction/Adduction AAROM railing slides 3 x 10  Shoulder at 0 deg abduction ER isometric 3 sec hold 1 x 10  Shoulder at 0 deg abduction IR isometric 3 sec hold 1 x 10   04/30/23: All single arm exercises performed on LUE  Shoulder pulleys flexion/extension AAROM 3 x 10  Shoulder pulleys abduction/adduction AAROM 3 x 10  Seated Shoulder Flex AAROM with wand 1 x 10  -Pt only able to raise arm to 70 deg flex  Supine Shoulder Flex AAROM with #AW 1 x 10 to 120 deg flex  -Pt able to reach 120 left shoulder flexion PROM  Shoulder Flex Isometric 3 sec hold 1 x 10  -Pt unable to complete full sets because of swollen left hand  Shoulder Abd Isometric 3 sec hold 2 x 10  Shoulder Ext Isometric 3 sec hold 2 x 10  Shoulder  ER Isometric 3 sec hold 2 x 10 -Pt reports increased pain on dorsal side of hand  Shoulder Flexion Isometric into palm of opposite hand 3 sec hold 1 x 10  Shoulder ER Isometric into palm of opposite hand 3 sec hold 1 x 10    PATIENT EDUCATION: Education details: HEP, goals, POC Person educated: Patient Education method: Explanation, Demonstration, and Handouts Education comprehension: verbalized understanding and returned demonstration   HOME EXERCISE PROGRAM: Access Code: [email or web address] URL: [email or web address] Date: 05/07/2023 Prepared by: Ellin Goodie  Exercises - Seated Shoulder Flexion AAROM with Pulley Behind  - 1 x daily - 3 sets - 10 reps - Seated Shoulder Abduction AAROM with Pulley Behind  - 1 x daily - 3 sets - 10 reps - Scare the Bear   - 1 x daily - 3 sets - 10 reps - Supine Shoulder External  Rotation AAROM with Dowel  - 1 x daily - 3 sets - 10 reps - Isometric Shoulder Extension at Wall  - 3-4 x weekly - 3 sets - 10 reps - 3 sec  hold - Standing Isometric Shoulder Internal Rotation at Doorway  - 3-4 x weekly - 3 sets - 10 reps - 3 sec hold - Supine Shoulder External Rotation with Dowel  - 1 x daily - 3 sets - 10 reps - Seated Shoulder Abduction Towel Slide at Table Top  - 1 x daily - 3 sets - 10 reps - Seated Shoulder Flexion Towel Slide at Table Top  - 1 x daily - 3 sets - 10 reps - Seated Isometric Shoulder Internal Rotation with Towel  - 1 x daily - 3 sets - 10 reps - 3 sec  hold - Isometric Shoulder External Rotation  - 1 x daily - 3 sets - 10 reps - 3 sec  hold   ASSESSMENT:   CLINICAL IMPRESSION: Pt is now 9 weeks s/p left shoulder RTC debridement. Pt continues to show left shoulder weakness even in gravity eliminated position. He did show improvement with rhythmic stabilization with D2 PNF indicating benefits of neuromuscular facilitation to improve muscle activation starting in gravity eliminated position. He will continue to  benefit from skilled therapy to address deficits in order to improve quality of life and return to PLOF while following protocol provided by MD.   OBJECTIVE IMPAIRMENTS: decreased activity tolerance, decreased mobility, decreased ROM, decreased strength, hypomobility, impaired flexibility, impaired sensation, impaired UE functional use, improper body mechanics, and pain.    ACTIVITY LIMITATIONS: carrying, lifting, bathing, toileting, dressing, reach over head, and hygiene/grooming   PARTICIPATION LIMITATIONS: meal prep, cleaning, laundry, driving, community activity, occupation, and yard work   PERSONAL FACTORS: Age, Fitness, Profession, and Time since onset of injury/illness/exacerbation are also affecting patient's functional outcome.    REHAB POTENTIAL: Good   CLINICAL DECISION MAKING: Stable/uncomplicated   EVALUATION COMPLEXITY: Low      GOALS: Goals reviewed with patient? Yes   SHORT TERM GOALS: Target date: 04/11/2023   Patient will be independent in home exercise program to improve strength/mobility for better functional independence with ADLs. Baseline: 5/16: HEP given  05/09/23: Able to perform independently  Goal status: Ongoing   2. Patient will demonstrate knowledge of how to correctly don and doff left shoulder brace in order to be able to do it after surgery.             Baseline: NT 03/18/23: Pt demonstrates understanding             Goal status: Achieved  LONG TERM GOALS: Target date: 06/06/2023     Patient will increase FOTO score to equal to or greater than 50 to demonstrate statistically significant improvement in mobility and quality of life. Baseline: 5/16: 34 Goal status: Ongoing    2.  Patient will show symmetrical strength between left shoulder with right shoulder to regain ability to perform reaching tasks for his job  Baseline: see above Goal status: Ongoing    3.  Patient will improve AROM UE so they are able to perform typical job tasks and overhead ADL's such as reaching into cabinets. Baseline: see above  05/09/23: Still not reaching above his head except for exercises  Goal status: Ongoing    4.  Patient will report a worst pain of 3/10 on NPRS in to improve tolerance with ADLs and reduced symptoms with activities. Baseline: see above  05/09/23: 5/10 NRPS  Goal status: Ongoing        PLAN:   PT FREQUENCY: 1-2x/week   PT DURATION: 12 weeks   PLANNED INTERVENTIONS: Therapeutic exercises, Therapeutic activity, Neuromuscular re-education, Balance training, Gait training, Patient/Family education, Self Care, Joint mobilization, Joint manipulation, Cryotherapy, Moist heat, and Manual therapy   PLAN FOR NEXT SESSION: Shoulder Flex and Abduction in gravity eliminated positions. D2 PNF Flexion/Extension rhythmic stabilization. Continue to progress shoulder AROM and AAROM exercises.     Ellin Goodie PT, DPT  [facility] [address] [place], [address] Phone: [phone]   Fax:  [phone]

## 2023-05-16 ENCOUNTER — Ambulatory Visit: Payer: Medicare Other | Admitting: Physical Therapy

## 2023-05-16 DIAGNOSIS — M6281 Muscle weakness (generalized): Secondary | ICD-10-CM

## 2023-05-16 DIAGNOSIS — M25512 Pain in left shoulder: Secondary | ICD-10-CM

## 2023-05-16 DIAGNOSIS — M79602 Pain in left arm: Secondary | ICD-10-CM | POA: Diagnosis not present

## 2023-05-16 NOTE — Therapy (Signed)
OUTPATIENT PHYSICAL THERAPY TREATMENT NOTE    Patient Name: Jimmy Montoya MRN: [id number] DOB:1956-07-05, 67 y.o., male Today's Date: 05/16/2023  PCP: Dr. Shenaya Lebo Nones  REFERRING PROVIDER: Dr. Massie Bougie   END OF SESSION:   PT End of Session - 05/16/23 1402     Visit Number 12    Number of Visits 24    Date for PT Re-Evaluation 06/06/23    Authorization Type [employer]    Authorization - Visit Number 12    Authorization - Number of Visits 20    Progress Note Due on Visit 10    PT Start Time 1350    PT Stop Time 1430    PT Time Calculation (min) 40 min    Activity Tolerance Patient tolerated treatment well;No increased pain    Behavior During Therapy [facility] for tasks assessed/performed                 Past Medical History:  Diagnosis Date   Anemia 03/20/2021   Flu 09/29/2015   Hernia, inguinal, right 09/02/2015   Hyperlipemia    Hypertension    Kidney stone    Past Surgical History:  Procedure Laterality Date   HERNIA REPAIR     INGUINAL HERNIA REPAIR Right 12/23/2015   Procedure: HERNIA REPAIR INGUINAL ADULT;  Surgeon: Nadeen Landau, MD;  Location: [facility] ORS;  Service: General;  Laterality: Right;   Patient Active Problem List   Diagnosis Date Noted   Acute CVA (cerebrovascular accident) (HCC) 03/20/2021   HTN (hypertension) 03/20/2021   HLD (hyperlipidemia) 03/20/2021   Anemia 03/20/2021   Hypokalemia 03/20/2021    REFERRING DIAG: s/p L shoulder arthroscopy, RC repair and biceps tenodesis   THERAPY DIAG:  Acute pain of left shoulder  Muscle weakness (generalized)  Rationale for Evaluation and Treatment Rehabilitation  PERTINENT HISTORY: Per Dr. Binnie Rail note (02/01/23), L shoulder injury occurred 1 week prior while holding reigns of a horse and horse suddenly reared causing L shoulder to dislocate which was reduced under conscious sedation. Underwent EMG/NVC and MRI. MRI found L rotator cuff tear. S/p L rotator cuff repair and biceps  tenodesis on 03/12/23.   PRECAUTIONS: No AROM of the elbow or shoulder                              No Shoulder ER beyond 40 deg                              No Shoulder Ext or horizontal abduction past neutral                             No lifting objects                              Place a towel roll or pillow under elbow while laying supine to avoid shoulder ext                             No friction massage of surgical site    SUBJECTIVE:  SUBJECTIVE STATEMENT:  Pt reports ongoing left shoulder and forearm soreness from using a hedge clipper to prune his bushes.   PAIN:  Are you having pain? 4/5 NRPS, left anterior shoulder   OBJECTIVE:              TODAY'S TREATMENT:                                                                                                                                         DATE:   05/16/23: All single arm exercises performed on LUE  THEREX  Shoulder Flex and Ext AAROM 3 x 10  Shoulder Abd and Add AAROM 3 x 10  OMEGA Seated Rows #20 3 x 10  Shoulder Salutes 3 x 10  -min VC to decrease lumbar extension  Supine Shoulder Flex #1 DB 2 x 10  Shoulder PROM Abduction on LUE to 120 deg 1 x 10  -Beyond 120 deg pt reports 7/10 NRPS   NMR   PNF D2 Shoulder Flexion and Extension 3 x 10  -mod VC and TC to cue muscle activation  PNF D1 Shoulder Flexion and Extension 3 x 10 -mod VC and TC to cue muscle activation   05/09/23: All single arm exercises performed on LUE UBE seat 7 (2.5 forward and 2.5 backward) 5 min OMEGA Seated Rows #20 3 x 10  -min VC to decrease speed for increased time under tension  Supine Shoulder Abduction with washcloth to decrease friction 3 x 30  Side Lying Shoulder Flexion with red ball roll on big mat 3 x 30  Shoulder Railing Trunk Flex to 45 deg on  railing 3 x 10  -Mod VC to prevent trunk rotation  Side Lying D2 PNF Flexion and Extension 3 x 10  -intermittent PROM for NMR   05/09/23: All single arm exercises performed on LUE UBE seat 7 (2.5 forward and 2.5 backward) 5 min OMEGA Seated Rows #20 3 x 10  -min VC to decrease speed for increased time under tension  Sleeper Stretch 4 x 30 sec  Shoulder Abduction AROM with red ball seated beside mat table 3 x 10  Shoulder Abduction AAROM Pulleys 3 x 10  FOTO: 35/100   Shoulder Flex R/L 4+/3- Shoulder Abd R/L 4+/3-    05/07/23: All single arm exercises performed on LUE UBE seat 7 (2.5 forward and 2.5 backward) 5 min  OMEGA Seated Rows #15 3 x 10  -Pt does not fully grip left handle  -min VC and TC for proper sequence of exercise  Shoulder Flexion/Extension AAROM Pulleys 3 x 10  Shoulder Abduction/Adduction AAROM Pulleys 3 x 10  Shoulder Flexion/Extension AAROM railing slides 3 x 10  Shoulder Abduction/Adduction AAROM railing slides 3 x 10  Shoulder at 0 deg abduction ER isometric 3 sec hold 1 x 10  Shoulder at 0 deg abduction IR isometric 3 sec hold 1 x 10  PATIENT EDUCATION: Education details: HEP, goals, POC Person educated: Patient Education method: Explanation, Demonstration, and Handouts Education comprehension: verbalized understanding and returned demonstration   HOME EXERCISE PROGRAM: Access Code: [email or web address] URL: [email or web address] Date: 05/07/2023 Prepared by: Ellin Goodie  Exercises - Seated Shoulder Flexion AAROM with Pulley Behind  - 1 x daily - 3 sets - 10 reps - Seated Shoulder Abduction AAROM with Pulley Behind  - 1 x daily - 3 sets - 10 reps - Scare the Bear   - 1 x daily - 3 sets - 10 reps - Supine Shoulder External Rotation AAROM with Dowel  - 1 x daily - 3 sets - 10 reps - Isometric Shoulder Extension at Wall  - 3-4 x weekly - 3 sets - 10 reps - 3 sec  hold - Standing Isometric Shoulder Internal Rotation at Doorway  - 3-4 x weekly -  3 sets - 10 reps - 3 sec hold - Supine Shoulder External Rotation with Dowel  - 1 x daily - 3 sets - 10 reps - Seated Shoulder Abduction Towel Slide at Table Top  - 1 x daily - 3 sets - 10 reps - Seated Shoulder Flexion Towel Slide at Table Top  - 1 x daily - 3 sets - 10 reps - Seated Isometric Shoulder Internal Rotation with Towel  - 1 x daily - 3 sets - 10 reps - 3 sec  hold - Isometric Shoulder External Rotation  - 1 x daily - 3 sets - 10 reps - 3 sec  hold   ASSESSMENT:   CLINICAL IMPRESSION: Pt is now 9.5 weeks s/p left shoulder RTC debridement. Pt does show improvement with left shoulder strength with ability to perform D1 and D2 in less of a gravity eliminated position from side lying to supine. He does still show significant left shoulder weakness with in ability abduct or flex shoulder to 90 degrees. He will continue to  benefit from skilled therapy to address deficits in order to improve quality of life and return to PLOF while following protocol provided by MD.   OBJECTIVE IMPAIRMENTS: decreased activity tolerance, decreased mobility, decreased ROM, decreased strength, hypomobility, impaired flexibility, impaired sensation, impaired UE functional use, improper body mechanics, and pain.    ACTIVITY LIMITATIONS: carrying, lifting, bathing, toileting, dressing, reach over head, and hygiene/grooming   PARTICIPATION LIMITATIONS: meal prep, cleaning, laundry, driving, community activity, occupation, and yard work   PERSONAL FACTORS: Age, Fitness, Profession, and Time since onset of injury/illness/exacerbation are also affecting patient's functional outcome.    REHAB POTENTIAL: Good   CLINICAL DECISION MAKING: Stable/uncomplicated   EVALUATION COMPLEXITY: Low     GOALS: Goals reviewed with patient? Yes   SHORT TERM GOALS: Target date: 04/11/2023   Patient will be independent in home exercise program to improve strength/mobility for better functional independence with  ADLs. Baseline: 5/16: HEP given  05/09/23: Able to perform independently  Goal status: Ongoing   2. Patient will demonstrate knowledge of how to correctly don and doff left shoulder brace in order to be able to do it after surgery.             Baseline: NT 03/18/23: Pt demonstrates understanding             Goal status: Achieved      LONG TERM GOALS: Target date: 06/06/2023     Patient will increase FOTO score to equal to or greater than 50 to demonstrate statistically significant improvement in mobility and quality of life. Baseline: 5/16: 34  Goal status: Ongoing    2.  Patient will show symmetrical strength between left shoulder with right shoulder to regain ability to perform reaching tasks for his job  Baseline: see above Goal status: Ongoing    3.  Patient will improve AROM UE so they are able to perform typical job tasks and overhead ADL's such as reaching into cabinets. Baseline: see above  05/09/23: Still not reaching above his head except for exercises  Goal status: Ongoing    4.  Patient will report a worst pain of 3/10 on NPRS in to improve tolerance with ADLs and reduced symptoms with activities. Baseline: see above  05/09/23: 5/10 NRPS  Goal status: Ongoing        PLAN:   PT FREQUENCY: 1-2x/week   PT DURATION: 12 weeks   PLANNED INTERVENTIONS: Therapeutic exercises, Therapeutic activity, Neuromuscular re-education, Balance training, Gait training, Patient/Family education, Self Care, Joint mobilization, Joint manipulation, Cryotherapy, Moist heat, and Manual therapy   PLAN FOR NEXT SESSION: Continue witn D1 and D2 flexion and extension in supine. Add salutes to home exercise plan and progress parascapular exercises    Ellin Goodie PT, DPT  [facility] [address] [place], [address] Phone: [phone]   Fax:  [phone]

## 2023-05-21 ENCOUNTER — Ambulatory Visit: Payer: Medicare Other | Admitting: Physical Therapy

## 2023-05-21 DIAGNOSIS — M6281 Muscle weakness (generalized): Secondary | ICD-10-CM

## 2023-05-21 DIAGNOSIS — M25512 Pain in left shoulder: Secondary | ICD-10-CM

## 2023-05-21 DIAGNOSIS — M79602 Pain in left arm: Secondary | ICD-10-CM | POA: Diagnosis not present

## 2023-05-21 NOTE — Therapy (Signed)
OUTPATIENT PHYSICAL THERAPY TREATMENT NOTE    Patient Name: Jimmy Montoya MRN: [id number] DOB:Sep 26, 1956, 67 y.o., male Today's Date: 05/21/2023  PCP: Dr. Keziah Drotar Nones  REFERRING PROVIDER: Dr. Massie Bougie   END OF SESSION:   PT End of Session - 05/21/23 1315     Visit Number 13    Number of Visits 24    Date for PT Re-Evaluation 06/06/23    Authorization Type [employer]    Authorization - Visit Number 13    Authorization - Number of Visits 20    Progress Note Due on Visit 20    PT Start Time 1305    PT Stop Time 1345    PT Time Calculation (min) 40 min    Activity Tolerance Patient tolerated treatment well;No increased pain    Behavior During Therapy [facility] for tasks assessed/performed                 Past Medical History:  Diagnosis Date   Anemia 03/20/2021   Flu 09/29/2015   Hernia, inguinal, right 09/02/2015   Hyperlipemia    Hypertension    Kidney stone    Past Surgical History:  Procedure Laterality Date   HERNIA REPAIR     INGUINAL HERNIA REPAIR Right 12/23/2015   Procedure: HERNIA REPAIR INGUINAL ADULT;  Surgeon: Nadeen Landau, MD;  Location: [facility] ORS;  Service: General;  Laterality: Right;   Patient Active Problem List   Diagnosis Date Noted   Acute CVA (cerebrovascular accident) (HCC) 03/20/2021   HTN (hypertension) 03/20/2021   HLD (hyperlipidemia) 03/20/2021   Anemia 03/20/2021   Hypokalemia 03/20/2021    REFERRING DIAG: s/p L shoulder arthroscopy, RC repair and biceps tenodesis   THERAPY DIAG:  Acute pain of left shoulder  Muscle weakness (generalized)  Rationale for Evaluation and Treatment Rehabilitation  PERTINENT HISTORY: Per Dr. Binnie Rail note (02/01/23), L shoulder injury occurred 1 week prior while holding reigns of a horse and horse suddenly reared causing L shoulder to dislocate which was reduced under conscious sedation. Underwent EMG/NVC and MRI. MRI found L rotator cuff tear. S/p L rotator cuff repair and biceps  tenodesis on 03/12/23.   PRECAUTIONS: No AROM of the elbow or shoulder                              No Shoulder ER beyond 40 deg                              No Shoulder Ext or horizontal abduction past neutral                             No lifting objects                              Place a towel roll or pillow under elbow while laying supine to avoid shoulder ext                             No friction massage of surgical site    SUBJECTIVE:  SUBJECTIVE STATEMENT:  Pt reports that he thinks he passed a kidney stone yesterday because he had a lot of lower back pain. He has some left shoulder soreness after cutting hedges.   PAIN:  Are you having pain? 3/10 NRPS, left anterior shoulder   OBJECTIVE:              TODAY'S TREATMENT:                                                                                                                                         DATE:   05/21/23: All single arm exercises performed on LUE Shoulder Flex and Ext AAROM 3 x 10  Shoulder Abd and Add AAROM 3 x 10  OMEGA Seated Rows with #25 lbs 3 x 10  Shoulder wall walks on LUE 1 x 10  Shoulder Abduction wall walks on LUE 1 x 5  -Pt is unable to abduct past 40 deg  Supine Shoulder Abduction to 90 deg 1 x 10  Supine Shoulder Abduction with elbow flexed to 90 deg 1 x 10  Supine D2 PNF Flexion and Extension AROM with intermittent support from PT 1 x 10  Side Lying Shoulder Abduction to 120 deg 2 x 10  Side Lying Shoulder D2 PNF Flex and Ext 2 x 10   05/16/23: All single arm exercises performed on LUE  THEREX  Shoulder Flex and Ext AAROM 3 x 10  Shoulder Abd and Add AAROM 3 x 10  OMEGA Seated Rows #20 3 x 10  Shoulder Salutes 3 x 10  -min VC to decrease lumbar extension  Supine Shoulder Flex #1 DB 2 x 10  Shoulder PROM  Abduction on LUE to 120 deg 1 x 10  -Beyond 120 deg pt reports 7/10 NRPS   NMR   PNF D2 Shoulder Flexion and Extension 3 x 10  -mod VC and TC to cue muscle activation  PNF D1 Shoulder Flexion and Extension 3 x 10 -mod VC and TC to cue muscle activation   05/09/23: All single arm exercises performed on LUE UBE seat 7 (2.5 forward and 2.5 backward) 5 min OMEGA Seated Rows #20 3 x 10  -min VC to decrease speed for increased time under tension  Supine Shoulder Abduction with washcloth to decrease friction 3 x 30  Side Lying Shoulder Flexion with red ball roll on big mat 3 x 30  Shoulder Railing Trunk Flex to 45 deg on railing 3 x 10  -Mod VC to prevent trunk rotation  Side Lying D2 PNF Flexion and Extension 3 x 10  -intermittent PROM for NMR   05/09/23: All single arm exercises performed on LUE UBE seat 7 (2.5 forward and 2.5 backward) 5 min OMEGA Seated Rows #20 3 x 10  -min VC to decrease speed for increased time under tension  Sleeper Stretch 4 x 30 sec  Shoulder Abduction AROM with  red ball seated beside mat table 3 x 10  Shoulder Abduction AAROM Pulleys 3 x 10  FOTO: 35/100   Shoulder Flex R/L 4+/3- Shoulder Abd R/L 4+/3-   PATIENT EDUCATION: Education details: HEP, goals, POC Person educated: Patient Education method: Explanation, Demonstration, and Handouts Education comprehension: verbalized understanding and returned demonstration   HOME EXERCISE PROGRAM: Access Code: [email or web address] URL: [email or web address] Date: 05/21/2023 Prepared by: Ellin Goodie  Exercises - Seated Shoulder Flexion AAROM with Pulley Behind  - 1 x daily - 3 sets - 10 reps - Seated Shoulder Abduction AAROM with Pulley Behind  - 1 x daily - 3 sets - 10 reps - Sleeper Stretch  - 1 x daily - 3 reps - 30-60 sec  hold - Shoulder External Rotation and Scapular Retraction  - 3-4 x weekly - 3 sets - 10 reps - Seated Shoulder Abduction Towel Slide at Table Top  - 1 x daily - 3 sets - 10  reps - Supine Shoulder Flexion Extension Full Range AROM  - 3-4 x weekly - 3 sets - 10 reps - Supine Single Arm Shoulder PNF D1 Flexion  - 1 x daily - 2 sets - 10 reps - Side-lying Shoulder PNF D2 Flexion and Extension  - 1 x daily - 3 sets - 10 reps - Sidelying Shoulder Abduction Full Range of Motion  - 1 x daily - 3 sets - 10 reps   ASSESSMENT:   CLINICAL IMPRESSION: Pt is now 10 weeks s/p left shoulder RTC debridement. He continues to show shoulder weakness with decreased left shoulder abduction and flexion AROM. Exercises modified to include gravity eliminated position, which pt was able to perform through near full ROM.  He will continue to  benefit from skilled therapy to address deficits in order to improve quality of life and return to PLOF while following protocol provided by MD.  OBJECTIVE IMPAIRMENTS: decreased activity tolerance, decreased mobility, decreased ROM, decreased strength, hypomobility, impaired flexibility, impaired sensation, impaired UE functional use, improper body mechanics, and pain.    ACTIVITY LIMITATIONS: carrying, lifting, bathing, toileting, dressing, reach over head, and hygiene/grooming   PARTICIPATION LIMITATIONS: meal prep, cleaning, laundry, driving, community activity, occupation, and yard work   PERSONAL FACTORS: Age, Fitness, Profession, and Time since onset of injury/illness/exacerbation are also affecting patient's functional outcome.    REHAB POTENTIAL: Good   CLINICAL DECISION MAKING: Stable/uncomplicated   EVALUATION COMPLEXITY: Low     GOALS: Goals reviewed with patient? Yes   SHORT TERM GOALS: Target date: 04/11/2023   Patient will be independent in home exercise program to improve strength/mobility for better functional independence with ADLs. Baseline: 5/16: HEP given  05/09/23: Able to perform independently  Goal status: Ongoing   2. Patient will demonstrate knowledge of how to correctly don and doff left shoulder brace in order to  be able to do it after surgery.             Baseline: NT 03/18/23: Pt demonstrates understanding             Goal status: Achieved      LONG TERM GOALS: Target date: 06/06/2023     Patient will increase FOTO score to equal to or greater than 50 to demonstrate statistically significant improvement in mobility and quality of life. Baseline: 5/16: 34 Goal status: Ongoing    2.  Patient will show symmetrical strength between left shoulder with right shoulder to regain ability to perform reaching tasks for his job  Baseline: see above Goal status:  Ongoing    3.  Patient will improve AROM UE so they are able to perform typical job tasks and overhead ADL's such as reaching into cabinets. Baseline: see above  05/09/23: Still not reaching above his head except for exercises  Goal status: Ongoing    4.  Patient will report a worst pain of 3/10 on NPRS in to improve tolerance with ADLs and reduced symptoms with activities. Baseline: see above  05/09/23: 5/10 NRPS  Goal status: Ongoing        PLAN:   PT FREQUENCY: 1-2x/week   PT DURATION: 12 weeks   PLANNED INTERVENTIONS: Therapeutic exercises, Therapeutic activity, Neuromuscular re-education, Balance training, Gait training, Patient/Family education, Self Care, Joint mobilization, Joint manipulation, Cryotherapy, Moist heat, and Manual therapy   PLAN FOR NEXT SESSION: Attempt lat pull downs, abduction against gravity and if too weak then perform in gravity eliminated position. No restrictions at this point in protocol.    Ellin Goodie PT, DPT  [facility] [address], [place], [address] Phone: [phone]   Fax:  [phone]

## 2023-05-23 ENCOUNTER — Ambulatory Visit: Payer: Medicare Other

## 2023-05-23 ENCOUNTER — Ambulatory Visit: Payer: Medicare Other | Admitting: Occupational Therapy

## 2023-05-23 DIAGNOSIS — M6281 Muscle weakness (generalized): Secondary | ICD-10-CM

## 2023-05-23 DIAGNOSIS — M79602 Pain in left arm: Secondary | ICD-10-CM | POA: Diagnosis not present

## 2023-05-23 DIAGNOSIS — M25512 Pain in left shoulder: Secondary | ICD-10-CM

## 2023-05-23 DIAGNOSIS — M25642 Stiffness of left hand, not elsewhere classified: Secondary | ICD-10-CM

## 2023-05-23 NOTE — Therapy (Signed)
OUTPATIENT PHYSICAL THERAPY TREATMENT NOTE    Patient Name: Jimmy Montoya MRN: [id number] DOB:1956/08/04, 67 y.o., male Today's Date: 05/23/2023  PCP: Dr. Daniel Nones  REFERRING PROVIDER: Dr. Massie Bougie   END OF SESSION:   PT End of Session - 05/23/23 1316     Visit Number 14    Number of Visits 24    Date for PT Re-Evaluation 06/06/23    Authorization Type [employer]    Progress Note Due on Visit 20    PT Start Time 1305    PT Stop Time 1345    PT Time Calculation (min) 40 min    Activity Tolerance Patient tolerated treatment well;No increased pain    Behavior During Therapy [facility] for tasks assessed/performed                 Past Medical History:  Diagnosis Date   Anemia 03/20/2021   Flu 09/29/2015   Hernia, inguinal, right 09/02/2015   Hyperlipemia    Hypertension    Kidney stone    Past Surgical History:  Procedure Laterality Date   HERNIA REPAIR     INGUINAL HERNIA REPAIR Right 12/23/2015   Procedure: HERNIA REPAIR INGUINAL ADULT;  Surgeon: Nadeen Landau, MD;  Location: [facility] ORS;  Service: General;  Laterality: Right;   Patient Active Problem List   Diagnosis Date Noted   Acute CVA (cerebrovascular accident) (HCC) 03/20/2021   HTN (hypertension) 03/20/2021   HLD (hyperlipidemia) 03/20/2021   Anemia 03/20/2021   Hypokalemia 03/20/2021    REFERRING DIAG: s/p L shoulder arthroscopy, RC repair and biceps tenodesis   THERAPY DIAG:  Acute pain of left shoulder  Muscle weakness (generalized)  Stiffness of left hand, not elsewhere classified  Rationale for Evaluation and Treatment Rehabilitation  PERTINENT HISTORY: Per Dr. Binnie Rail note (02/01/23), L shoulder injury occurred 1 week prior while holding reigns of a horse and horse suddenly reared causing L shoulder to dislocate which was reduced under conscious sedation. Underwent EMG/NVC and MRI. MRI found L rotator cuff tear. S/p L rotator cuff repair and biceps tenodesis on 03/12/23.    PRECAUTIONS: No AROM of the elbow or shoulder                              No Shoulder ER beyond 40 deg                              No Shoulder Ext or horizontal abduction past neutral                             No lifting objects                              Place a towel roll or pillow under elbow while laying supine to avoid shoulder ext                             No friction massage of surgical site    SUBJECTIVE:  SUBJECTIVE STATEMENT:  Pt reports that he thinks he passed a kidney stone yesterday because he had a lot of lower back pain. He has some left shoulder soreness after cutting hedges.   PAIN:  Are you having pain? 3/10 NRPS, left anterior shoulder   OBJECTIVE:              TODAY'S TREATMENT:                                                                                                                                         DATE:    05/23/23 -UBE level 2 [id number] -shoulder flexion wall slides stretch (range insufficient for full ergonomic benefit for position)  -seated table slides flexion 5x45sec -sensory tens application from [facility] joint to deltoid insertion -myofascial sustained release to Left teres major x60sec, then anterior deltoid x4 minutes -supine contract/relax mobilization of Left GHJ ER 10sec:30sec stretch  -standing AA/ROM Left shoulder flexion to 90 degrees with eccentric lowering 1x10 -standing scapular retraction 1x10x3secH  -standing AA/ROM Left shoulder ABDCT to 90 degrees with eccentric lowering 1x10   05/21/23: All single arm exercises performed on LUE Shoulder Flex and Ext AAROM 3 x 10  Shoulder Abd and Add AAROM 3 x 10  OMEGA Seated Rows with #25 lbs 3 x 10  Shoulder wall walks on LUE 1 x 10  Shoulder Abduction wall walks on LUE 1 x 5  -Pt is unable to  abduct past 40 deg  Supine Shoulder Abduction to 90 deg 1 x 10  Supine Shoulder Abduction with elbow flexed to 90 deg 1 x 10  Supine D2 PNF Flexion and Extension AROM with intermittent support from PT 1 x 10  Side Lying Shoulder Abduction to 120 deg 2 x 10  Side Lying Shoulder D2 PNF Flex and Ext 2 x 10   05/16/23: All single arm exercises performed on LUE  THEREX  Shoulder Flex and Ext AAROM 3 x 10  Shoulder Abd and Add AAROM 3 x 10  OMEGA Seated Rows #20 3 x 10  Shoulder Salutes 3 x 10  -min VC to decrease lumbar extension  Supine Shoulder Flex #1 DB 2 x 10  Shoulder PROM Abduction on LUE to 120 deg 1 x 10  -Beyond 120 deg pt reports 7/10 NRPS   NMR   PNF D2 Shoulder Flexion and Extension 3 x 10  -mod VC and TC to cue muscle activation  PNF D1 Shoulder Flexion and Extension 3 x 10 -mod VC and TC to cue muscle activation   05/09/23: All single arm exercises performed on LUE UBE seat 7 (2.5 forward and 2.5 backward) 5 min OMEGA Seated Rows #20 3 x 10  -min VC to decrease speed for increased time under tension  Supine Shoulder Abduction with washcloth to decrease friction 3 x 30  Side Lying Shoulder Flexion with red ball roll on big mat 3  x 30  Shoulder Railing Trunk Flex to 45 deg on railing 3 x 10  -Mod VC to prevent trunk rotation  Side Lying D2 PNF Flexion and Extension 3 x 10  -intermittent PROM for NMR   05/09/23: All single arm exercises performed on LUE UBE seat 7 (2.5 forward and 2.5 backward) 5 min OMEGA Seated Rows #20 3 x 10  -min VC to decrease speed for increased time under tension  Sleeper Stretch 4 x 30 sec  Shoulder Abduction AROM with red ball seated beside mat table 3 x 10  Shoulder Abduction AAROM Pulleys 3 x 10  FOTO: 35/100   Shoulder Flex R/L 4+/3- Shoulder Abd R/L 4+/3-   PATIENT EDUCATION: Education details: HEP, goals, POC Person educated: Patient Education method: Explanation, Demonstration, and Handouts Education comprehension: verbalized  understanding and returned demonstration   HOME EXERCISE PROGRAM: Access Code: [email or web address] URL: [email or web address] Date: 05/21/2023 Prepared by: Ellin Goodie  Exercises - Seated Shoulder Flexion AAROM with Pulley Behind  - 1 x daily - 3 sets - 10 reps - Seated Shoulder Abduction AAROM with Pulley Behind  - 1 x daily - 3 sets - 10 reps - Sleeper Stretch  - 1 x daily - 3 reps - 30-60 sec  hold - Shoulder External Rotation and Scapular Retraction  - 3-4 x weekly - 3 sets - 10 reps - Seated Shoulder Abduction Towel Slide at Table Top  - 1 x daily - 3 sets - 10 reps - Supine Shoulder Flexion Extension Full Range AROM  - 3-4 x weekly - 3 sets - 10 reps - Supine Single Arm Shoulder PNF D1 Flexion  - 1 x daily - 2 sets - 10 reps - Side-lying Shoulder PNF D2 Flexion and Extension  - 1 x daily - 3 sets - 10 reps - Sidelying Shoulder Abduction Full Range of Motion  - 1 x daily - 3 sets - 10 reps   ASSESSMENT:   CLINICAL IMPRESSION: Trying out some alternative modalities today as pt continues to be largely limited by postoperative shoulder stiffness. Integrated UBE today for warmup, tolerated well, then use of TENS for both pain management and addressing neuromotor inhibition. Long form stretches performed well, but do not create very much ROM change within session. We finish with AA/ROM in standing with cues to cease postural compensation, but strength remains very limited, requires max-total assist from 60-90 degrees in both flexion and abdct. No pain at end of session. He will continue to benefit from skilled therapy to address deficits in order to improve quality of life and return to PLOF while following protocol provided by MD.  OBJECTIVE IMPAIRMENTS: decreased activity tolerance, decreased mobility, decreased ROM, decreased strength, hypomobility, impaired flexibility, impaired sensation, impaired UE functional use, improper body mechanics, and pain.    ACTIVITY LIMITATIONS:  carrying, lifting, bathing, toileting, dressing, reach over head, and hygiene/grooming   PARTICIPATION LIMITATIONS: meal prep, cleaning, laundry, driving, community activity, occupation, and yard work   PERSONAL FACTORS: Age, Fitness, Profession, and Time since onset of injury/illness/exacerbation are also affecting patient's functional outcome.    REHAB POTENTIAL: Good   CLINICAL DECISION MAKING: Stable/uncomplicated   EVALUATION COMPLEXITY: Low     GOALS: Goals reviewed with patient? Yes   SHORT TERM GOALS: Target date: 04/11/2023   Patient will be independent in home exercise program to improve strength/mobility for better functional independence with ADLs. Baseline: 5/16: HEP given  05/09/23: Able to perform independently  Goal status: Ongoing   2. Patient will demonstrate  knowledge of how to correctly don and doff left shoulder brace in order to be able to do it after surgery.             Baseline: NT 03/18/23: Pt demonstrates understanding             Goal status: Achieved      LONG TERM GOALS: Target date: 06/06/2023     Patient will increase FOTO score to equal to or greater than 50 to demonstrate statistically significant improvement in mobility and quality of life. Baseline: 5/16: 34 Goal status: Ongoing    2.  Patient will show symmetrical strength between left shoulder with right shoulder to regain ability to perform reaching tasks for his job  Baseline: see above Goal status: Ongoing    3.  Patient will improve AROM UE so they are able to perform typical job tasks and overhead ADL's such as reaching into cabinets. Baseline: see above  05/09/23: Still not reaching above his head except for exercises  Goal status: Ongoing    4.  Patient will report a worst pain of 3/10 on NPRS in to improve tolerance with ADLs and reduced symptoms with activities. Baseline: see above  05/09/23: 5/10 NRPS  Goal status: Ongoing        PLAN:   PT FREQUENCY: 1-2x/week   PT  DURATION: 12 weeks   PLANNED INTERVENTIONS: Therapeutic exercises, Therapeutic activity, Neuromuscular re-education, Balance training, Gait training, Patient/Family education, Self Care, Joint mobilization, Joint manipulation, Cryotherapy, Moist heat, and Manual therapy   PLAN FOR NEXT SESSION: Attempt lat pull downs, abduction against gravity and if too weak then perform in gravity eliminated position. No restrictions at this point in protocol.    2:50 PM, 05/23/23 Rosamaria Lints, PT, DPT Physical Therapist - [place] [phone] (Office)

## 2023-05-27 ENCOUNTER — Telehealth: Payer: Self-pay | Admitting: Physical Therapy

## 2023-05-27 NOTE — Telephone Encounter (Signed)
Called pt to clarify which apt time he wanted because he is on the schedule twice for tomorrow at 8:15 am and 1:00 pm. Unable to reach but left VM instructing pt to call back.

## 2023-05-28 ENCOUNTER — Ambulatory Visit: Payer: Medicare Other | Admitting: Physical Therapy

## 2023-05-30 ENCOUNTER — Ambulatory Visit: Payer: Medicare Other | Attending: Physician Assistant | Admitting: Physical Therapy

## 2023-05-30 ENCOUNTER — Encounter: Payer: Self-pay | Admitting: Physical Therapy

## 2023-05-30 ENCOUNTER — Ambulatory Visit: Payer: Medicare Other | Admitting: Occupational Therapy

## 2023-05-30 DIAGNOSIS — M25612 Stiffness of left shoulder, not elsewhere classified: Secondary | ICD-10-CM | POA: Diagnosis present

## 2023-05-30 DIAGNOSIS — T148XXA Other injury of unspecified body region, initial encounter: Secondary | ICD-10-CM | POA: Insufficient documentation

## 2023-05-30 DIAGNOSIS — M6281 Muscle weakness (generalized): Secondary | ICD-10-CM | POA: Insufficient documentation

## 2023-05-30 DIAGNOSIS — M79602 Pain in left arm: Secondary | ICD-10-CM | POA: Diagnosis present

## 2023-05-30 DIAGNOSIS — M25512 Pain in left shoulder: Secondary | ICD-10-CM | POA: Diagnosis not present

## 2023-05-30 DIAGNOSIS — M25642 Stiffness of left hand, not elsewhere classified: Secondary | ICD-10-CM | POA: Insufficient documentation

## 2023-05-30 NOTE — Therapy (Signed)
OUTPATIENT PHYSICAL THERAPY TREATMENT NOTE    Patient Name: Jimmy Montoya MRN: [id number] DOB:1956/05/24, 67 y.o., male Today's Date: 05/30/2023  PCP: Dr. Ron Beske Nones  REFERRING PROVIDER: Dr. Massie Bougie   END OF SESSION:   PT End of Session - 05/30/23 0827     Visit Number 15    Number of Visits 24    Date for PT Re-Evaluation 06/06/23    Authorization Type [employer]    Authorization - Visit Number 15    Authorization - Number of Visits 20    Progress Note Due on Visit 20    PT Start Time 0820    PT Stop Time 0900    PT Time Calculation (min) 40 min    Activity Tolerance Patient tolerated treatment well;No increased pain    Behavior During Therapy [facility] for tasks assessed/performed                 Past Medical History:  Diagnosis Date   Anemia 03/20/2021   Flu 09/29/2015   Hernia, inguinal, right 09/02/2015   Hyperlipemia    Hypertension    Kidney stone    Past Surgical History:  Procedure Laterality Date   HERNIA REPAIR     INGUINAL HERNIA REPAIR Right 12/23/2015   Procedure: HERNIA REPAIR INGUINAL ADULT;  Surgeon: Nadeen Landau, MD;  Location: [facility] ORS;  Service: General;  Laterality: Right;   Patient Active Problem List   Diagnosis Date Noted   Acute CVA (cerebrovascular accident) (HCC) 03/20/2021   HTN (hypertension) 03/20/2021   HLD (hyperlipidemia) 03/20/2021   Anemia 03/20/2021   Hypokalemia 03/20/2021    REFERRING DIAG: s/p L shoulder arthroscopy, RC repair and biceps tenodesis   THERAPY DIAG:  Acute pain of left shoulder  Rationale for Evaluation and Treatment Rehabilitation  PERTINENT HISTORY: Per Dr. Binnie Rail note (02/01/23), L shoulder injury occurred 1 week prior while holding reigns of a horse and horse suddenly reared causing L shoulder to dislocate which was reduced under conscious sedation. Underwent EMG/NVC and MRI. MRI found L rotator cuff tear. S/p L rotator cuff repair and biceps tenodesis on 03/12/23.    PRECAUTIONS: No AROM of the elbow or shoulder                              No Shoulder ER beyond 40 deg                              No Shoulder Ext or horizontal abduction past neutral                             No lifting objects                              Place a towel roll or pillow under elbow while laying supine to avoid shoulder ext                             No friction massage of surgical site    SUBJECTIVE:  SUBJECTIVE STATEMENT:  Pt reports that he thinks he passed a kidney stone yesterday because he had a lot of lower back pain. He has some left shoulder soreness after cutting hedges.   PAIN:  Are you having pain? 3/10 NRPS, left anterior shoulder   OBJECTIVE:              TODAY'S TREATMENT:                                                                                                                                         DATE:   05/30/23: All UE on left arm  UBE 2.5 forward and 2.5 backward with seat at 8 and resistance at 2 for 5 min  OMEGA Seated Rows #30 3 x 10 Seated Shoulder Flexion AROM 90 with upper trap activation 1 x 10  Supine Shoulder Flexion #2 3 x 10  Bicep Curls 3 x 10 #2    Tricep Extension 3 x 10 #2   MANUAL  Triceps Stretch  Triceps Trigger Point Release    05/23/23 -UBE level 2 [id number] -shoulder flexion wall slides stretch (range insufficient for full ergonomic benefit for position)  -seated table slides flexion 5x45sec -sensory tens application from [facility] joint to deltoid insertion -myofascial sustained release to Left teres major x60sec, then anterior deltoid x4 minutes -supine contract/relax mobilization of Left GHJ ER 10sec:30sec stretch  -standing AA/ROM Left shoulder flexion to 90 degrees with eccentric lowering 1x10 -standing scapular retraction  1x10x3secH  -standing AA/ROM Left shoulder ABDCT to 90 degrees with eccentric lowering 1x10   05/21/23: All single arm exercises performed on LUE Shoulder Flex and Ext AAROM 3 x 10  Shoulder Abd and Add AAROM 3 x 10  OMEGA Seated Rows with #25 lbs 3 x 10  Shoulder wall walks on LUE 1 x 10  Shoulder Abduction wall walks on LUE 1 x 5  -Pt is unable to abduct past 40 deg  Supine Shoulder Abduction to 90 deg 1 x 10  Supine Shoulder Abduction with elbow flexed to 90 deg 1 x 10  Supine D2 PNF Flexion and Extension AROM with intermittent support from PT 1 x 10  Side Lying Shoulder Abduction to 120 deg 2 x 10  Side Lying Shoulder D2 PNF Flex and Ext 2 x 10     PATIENT EDUCATION: Education details: HEP, goals, POC Person educated: Patient Education method: Explanation, Demonstration, and Handouts Education comprehension: verbalized understanding and returned demonstration   HOME EXERCISE PROGRAM: Access Code: [email or web address] URL: [email or web address] Date: 05/30/2023 Prepared by: Ellin Goodie  Exercises - Standing Overhead Shoulder External Rotation Stretch with Towel  - 1 x daily - 3 reps - 30-60 sec hold - Seated Shoulder Flexion AAROM with Pulley Behind  - 1 x daily - 3 sets - 10 reps - Seated Shoulder Abduction AAROM with Pulley Behind  - 1 x daily - 3 sets - 10  reps - Engineer, building services  - 1 x daily - 3 reps - 30-60 sec  hold - Seated Shoulder Abduction Towel Slide at Table Top  - 1 x daily - 3 sets - 10 reps - Standing Bent Over Triceps Extension  - 3-4 x weekly - 3 sets - 10 reps - Seated Bicep Curls Supinated with Dumbbells  - 3-4 x weekly - 3 sets - 10 reps - Supine Shoulder Flexion Extension Full Range AROM  - 3-4 x weekly - 3 sets - 10 reps - Supine Single Arm Shoulder PNF D1 Flexion  - 1 x daily - 2 sets - 10 reps - Side-lying Shoulder PNF D2 Flexion and Extension  - 1 x daily - 3 sets - 10 reps - Sidelying Shoulder Abduction Full Range of Motion  - 1 x daily - 3  sets - 10 reps   ASSESSMENT:   CLINICAL IMPRESSION: Pt continues to be limited in shoulder AROM with ongoing shoulder weakness and postoperative stiffness. He is currently behind in his protocol with in ability to achieve full shoulder AROM and inability to reach overhead. Pt has upcoming orthopedic apt at end of August, but PT recommended that pt reach out sooner. He is limited by increased muscular tension in triceps muscular. He will continue to benefit from skilled therapy to address deficits in order to improve quality of life and return to PLOF while following protocol provided by MD.   OBJECTIVE IMPAIRMENTS: decreased activity tolerance, decreased mobility, decreased ROM, decreased strength, hypomobility, impaired flexibility, impaired sensation, impaired UE functional use, improper body mechanics, and pain.    ACTIVITY LIMITATIONS: carrying, lifting, bathing, toileting, dressing, reach over head, and hygiene/grooming   PARTICIPATION LIMITATIONS: meal prep, cleaning, laundry, driving, community activity, occupation, and yard work   PERSONAL FACTORS: Age, Fitness, Profession, and Time since onset of injury/illness/exacerbation are also affecting patient's functional outcome.    REHAB POTENTIAL: Good   CLINICAL DECISION MAKING: Stable/uncomplicated   EVALUATION COMPLEXITY: Low     GOALS: Goals reviewed with patient? Yes   SHORT TERM GOALS: Target date: 04/11/2023   Patient will be independent in home exercise program to improve strength/mobility for better functional independence with ADLs. Baseline: 5/16: HEP given  05/09/23: Able to perform independently  Goal status: Ongoing   2. Patient will demonstrate knowledge of how to correctly don and doff left shoulder brace in order to be able to do it after surgery.             Baseline: NT 03/18/23: Pt demonstrates understanding             Goal status: Achieved      LONG TERM GOALS: Target date: 06/06/2023     Patient will  increase FOTO score to equal to or greater than 50 to demonstrate statistically significant improvement in mobility and quality of life. Baseline: 5/16: 34 Goal status: Ongoing    2.  Patient will show symmetrical strength between left shoulder with right shoulder to regain ability to perform reaching tasks for his job  Baseline: see above Goal status: Ongoing    3.  Patient will improve AROM UE so they are able to perform typical job tasks and overhead ADL's such as reaching into cabinets. Baseline: see above  05/09/23: Still not reaching above his head except for exercises  Goal status: Ongoing    4.  Patient will report a worst pain of 3/10 on NPRS in to improve tolerance with ADLs and reduced symptoms with activities. Baseline:  see above  05/09/23: 5/10 NRPS  Goal status: Ongoing        PLAN:   PT FREQUENCY: 1-2x/week   PT DURATION: 12 weeks   PLANNED INTERVENTIONS: Therapeutic exercises, Therapeutic activity, Neuromuscular re-education, Balance training, Gait training, Patient/Family education, Self Care, Joint mobilization, Joint manipulation, Cryotherapy, Moist heat, and Manual therapy   PLAN FOR NEXT SESSION: F/u with med about re obtaining post op protocol, continue with AROM flexion and abduction   Ellin Goodie PT, DPT  [facility] [address] [place], [address] Phone: [phone]   Fax:  [phone]

## 2023-06-05 ENCOUNTER — Ambulatory Visit: Payer: Medicare Other | Admitting: Physical Therapy

## 2023-06-10 ENCOUNTER — Ambulatory Visit: Payer: Medicare Other | Admitting: Occupational Therapy

## 2023-06-10 ENCOUNTER — Encounter: Payer: Medicare Other | Admitting: Physical Therapy

## 2023-06-10 DIAGNOSIS — M6281 Muscle weakness (generalized): Secondary | ICD-10-CM

## 2023-06-10 DIAGNOSIS — M25642 Stiffness of left hand, not elsewhere classified: Secondary | ICD-10-CM

## 2023-06-10 DIAGNOSIS — M25512 Pain in left shoulder: Secondary | ICD-10-CM | POA: Diagnosis not present

## 2023-06-10 DIAGNOSIS — T148XXA Other injury of unspecified body region, initial encounter: Secondary | ICD-10-CM

## 2023-06-10 NOTE — Therapy (Signed)
[facility] [address], [place], [address] Phone: [phone]   Fax:  [phone]  Occupational Therapy Treatment  Patient Details  Name: Jimmy Montoya MRN: [id number] Date of Birth: 1955-12-29 Referring Provider (OT): Dr Joice Lofts   Encounter Date: 06/10/2023   OT End of Session - 06/10/23 1332     Visit Number 16    Number of Visits 17    Date for OT Re-Evaluation 07/04/23    OT Start Time 0907    OT Stop Time 0945    OT Time Calculation (min) 38 min    Activity Tolerance Patient tolerated treatment well    Behavior During Therapy [facility] for tasks assessed/performed             Past Medical History:  Diagnosis Date   Anemia 03/20/2021   Flu 09/29/2015   Hernia, inguinal, right 09/02/2015   Hyperlipemia    Hypertension    Kidney stone     Past Surgical History:  Procedure Laterality Date   HERNIA REPAIR     INGUINAL HERNIA REPAIR Right 12/23/2015   Procedure: HERNIA REPAIR INGUINAL ADULT;  Surgeon: Nadeen Landau, MD;  Location: [facility] ORS;  Service: General;  Laterality: Right;    There were no vitals filed for this visit.   Subjective Assessment - 06/10/23 1313     Subjective  I am doing okay -seen the Surgeon - said the nerves in fingers can take another 6 months- thumb thru middle finger still pins and needles and stiff- using my hand more    Pertinent History 02/01/23 Ortho note with DR Poggi- Jimmy Montoya is a 67 y.o. male who presents for evaluation and treatment of a left shoulder injury which occurred 1 week ago. Apparently he was leaving a horse while holding onto the rains when the horse suddenly reared causing his shoulder to dislocate. He presented to the emergency room where the shoulder was reduced under conscious sedation. The patient notes that his shoulder is feeling reasonably comfortable but he has a lot of pain in the upper arm extending to the elbow region. He also notes numbness and  tingling as well as pain extending from his elbow down to his wrist. He has been wearing his shoulder immobilizer on a regular basis, removing it for bathing purposes. He rates his pain at 8/10 on today's visit and has been taking naproxen and oxycodone as necessary with temporary partial relief of his symptoms. He is right-hand dominant. He denies any prior dislocation or other injury to the left shoulder in the past.The patient is quite concerned by the neurologic deficits he continues to experience after his acute shoulder dislocation. It does appear that he has some deficits, primarily involving the radial and ulnar nerve distributions, although he also appears to have some deficits with the musculocutaneous and median nerves as well. Therefore, I have recommended that he undergo an EMG/NCV study of the left upper extremity to assess these nerves for evidence of damage and reinnervation. In addition, he will be started in occupational therapy to work on range of motion and strength exercises to the left upper extremity. Finally, he will be sent for an MRI scan of the left shoulder to evaluate for rotator cuff tear. Meanwhile, the patient will continue to wear his shoulder immobilizer on a regular basis, removing it for bathing purposes and for exercises. He may continue his present medication regimen, but is encouraged to wean off of his oxycodone as soon  as possible.    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    Currently in Pain? Yes    Pain Score 2     Pain Location --   Radial hand   Pain Orientation Left    Pain Descriptors / Indicators Pins and needles    Pain Type Surgical pain    Pain Onset More than a month ago                [facility] OT Assessment - 06/10/23 0001       AROM   Left Wrist Extension 68 Degrees    Left Wrist Flexion 85 Degrees    Left Wrist Radial Deviation 25 Degrees    Left Wrist Ulnar  Deviation 35 Degrees      Strength   Right Hand Grip (lbs) 58    Right Hand Lateral Pinch 20 lbs    Right Hand 3 Point Pinch 17 lbs    Left Hand Grip (lbs) 29    Left Hand Lateral Pinch 13 lbs    Left Hand 3 Point Pinch 11 lbs               Patient arrived after not being seen for about month.  Patient making great progress in wrist active range of motion since last time especially with wrist extension. Strength increased to 4+/5. Supination and end range wrist extention still decreased strength but improving. Edema decreases but still present at  thumb thru 3rd digits. Also some nerve pain still present over these 3 digits. But decrease to 2/10 Fisting improved with composite flexion improving As well as grip and prehension strength  cont improving. Stiffness in 5th and crossing under 4th. Pt to cont to address ROM in digits in the am - and then change  medium green putty for gripping and prehension.but gripping change to pulling with ulnar side of hand And can wear CMC neoprene wrap with lat and 3 point pinch -to decrease ADD of thumb thenar into CT Change and borrowed pt flex bar - yellow for sup and wrist extention strengthening 15 reps to start out with 1 x day  Increase to tolerance  Positive Tinel and decrease sensation middle phalanges at 2nd and 3rd - and thumb IP                OT Education - 06/10/23 1332     Education Details progress and changes to HEP    Person(s) Educated Patient    Methods Explanation;Demonstration;Tactile cues;Verbal cues;Handout    Comprehension Verbal cues required;Returned demonstration;Verbalized understanding                 OT Long Term Goals - 05/09/23 1443       OT LONG TERM GOAL #1   Title Patient wife independent in home program to decrease edema and pain less 3-4/10 at rest show increased elbow, wrist and digit active into motion    Status Achieved      OT LONG TERM GOAL #2   Title L Elbow flexion and  extension increased to within normal limits for patient able to initiate washing face and put hand through sleeve if    Status Deferred      OT LONG TERM GOAL #3   Title L wrist  and forearm ROM and strength increase to 4-/5 to turn doorknob and push hand thru sleeve  Status Achieved      OT LONG TERM GOAL #4   Title L hand digits increase for pt to touch palm and digits extention WNL to donn gloves    Status Achieved      OT LONG TERM GOAL #5   Title L thumb AROM increase to WNL for pt to hold cup and turn doorknob    Status Achieved      Long Term Additional Goals   Additional Long Term Goals Yes      OT LONG TERM GOAL #6   Title L grip and prehension strenght increase to more than 60% compare to R hand to use hand in ADlL's more than 90%    Baseline GRip and prehension increase but still decrease compare to R - Grip R 58; L 16 lbs, Lat grip R 20 ,L 9 and L 3 point 11 lbs    Time 8    Period Weeks    Status New    Target Date 07/04/23                   Plan - 06/10/23 1333     Clinical Impression Statement Pt was refer to OT with diagnosis of L shoulder anterior dislocation on 01/24/23. Pt is R hand dominant. Pt was seen for 9 visits prior to shoulder and bicep surgery 03/12/23 by DR Dewaine Conger. Prior to surgery pt made great progress  in elbow, forearm, wrist  and digits AROM - pain improved from 7-9/10 to 1/10 -only now more tingling on dorsal radial hand . Pt cont to have  edema and stiffness in L hand but mostly  thumb thru 3rd digits. Pt for month follow up doing HEP - great progress in edema over ulnar side of hand , increase wrist flexion and extention- increase strength. As well as increase grip and prehension strength. Pt fitted with CMC neoprene to use during day instead of wrist splint - pt to cont with PROM composite fist. in the am - prior to using hand during day. Borrowed pt yellow flex bar to use for wrist ext and sup strengthening - as well as doing green firm  putty pulling with ulnar side of hand to increase grip and prehension strength. Pt can benefit from skilled OT services to decrease edema, stiffness, pain and increase AROM and strength in L hand/ wrist  to be able to use L hand in functional tasks- pt had CVA in past but had full motion and strength with diminshed sensation in thumb and 2nd digit. + Tinel at middle phalanges of 2nd and 3rd and IP of thumb.    OT Occupational Profile and History Problem Focused Assessment - Including review of records relating to presenting problem    Occupational performance deficits (Please refer to evaluation for details): ADL's;IADL's;Rest and Sleep;Work;Play;Social Participation;Leisure    Body Structure / Function / Physical Skills ADL;Edema;Dexterity;Decreased knowledge of use of DME;Decreased knowledge of precautions;Flexibility;ROM;UE functional use;FMC;Sensation;Pain;Strength;IADL;Proprioception;Coordination    Rehab Potential Fair    Clinical Decision Making Several treatment options, min-mod task modification necessary    Comorbidities Affecting Occupational Performance: May have comorbidities impacting occupational performance    Modification or Assistance to Complete Evaluation  Min-Moderate modification of tasks or assist with assess necessary to complete eval    OT Frequency Monthly    OT Duration 8 weeks    OT Treatment/Interventions Self-care/ADL training;Moist Heat;Paraffin;Fluidtherapy;Contrast Bath;DME and/or AE instruction;Therapeutic exercise;Splinting;Patient/family education;Therapeutic activities;Manual Therapy;Passive range of motion    Consulted and Agree with Plan  of Care Patient             Patient will benefit from skilled therapeutic intervention in order to improve the following deficits and impairments:   Body Structure / Function / Physical Skills: ADL, Edema, Dexterity, Decreased knowledge of use of DME, Decreased knowledge of precautions, Flexibility, ROM, UE functional use,  FMC, Sensation, Pain, Strength, IADL, Proprioception, Coordination       Visit Diagnosis: Nerve injury  Muscle weakness (generalized)  Stiffness of left hand, not elsewhere classified    Problem List Patient Active Problem List   Diagnosis Date Noted   Acute CVA (cerebrovascular accident) (HCC) 03/20/2021   HTN (hypertension) 03/20/2021   HLD (hyperlipidemia) 03/20/2021   Anemia 03/20/2021   Hypokalemia 03/20/2021    Oletta Cohn, OTR/L,CLT 06/10/2023, 1:38 PM  [facility] [address] [place], [address] Phone: [phone]   Fax:  [phone]  Name: Jimmy Montoya MRN: [id number] Date of Birth: 11/14/1955

## 2023-06-12 ENCOUNTER — Encounter: Payer: Self-pay | Admitting: Physical Therapy

## 2023-06-12 ENCOUNTER — Ambulatory Visit: Payer: Medicare Other | Admitting: Physical Therapy

## 2023-06-12 DIAGNOSIS — M79602 Pain in left arm: Secondary | ICD-10-CM

## 2023-06-12 DIAGNOSIS — M25512 Pain in left shoulder: Secondary | ICD-10-CM | POA: Diagnosis not present

## 2023-06-12 DIAGNOSIS — M25612 Stiffness of left shoulder, not elsewhere classified: Secondary | ICD-10-CM

## 2023-06-12 NOTE — Addendum Note (Signed)
Addended by: Johnn Hai on: 06/12/2023 09:09 AM   Modules accepted: Orders

## 2023-06-12 NOTE — Therapy (Signed)
OUTPATIENT PHYSICAL THERAPY TREATMENT NOTE    Patient Name: Jimmy Montoya MRN: [id number] DOB:[birth year], 67 y.o., male Today's Date: 06/12/2023  PCP: Dr. Arvella Massingale Nones  REFERRING PROVIDER: Dr. Massie Bougie   END OF SESSION:   PT End of Session - 06/12/23 0911     Visit Number 16    Number of Visits 24    Date for PT Re-Evaluation 08/06/23    Authorization Type [employer]    Authorization Time Period 06/07/23-08/07/23    Authorization - Visit Number 16    Authorization - Number of Visits 20    Progress Note Due on Visit 20    PT Start Time 0905    PT Stop Time 0945    PT Time Calculation (min) 40 min    Activity Tolerance Patient tolerated treatment well;No increased pain    Behavior During Therapy [facility] for tasks assessed/performed                 Past Medical History:  Diagnosis Date   Anemia 03/20/2021   Flu 09/29/2015   Hernia, inguinal, right 09/02/2015   Hyperlipemia    Hypertension    Kidney stone    Past Surgical History:  Procedure Laterality Date   HERNIA REPAIR     INGUINAL HERNIA REPAIR Right 12/23/2015   Procedure: HERNIA REPAIR INGUINAL ADULT;  Surgeon: Nadeen Landau, MD;  Location: [facility] ORS;  Service: General;  Laterality: Right;   Patient Active Problem List   Diagnosis Date Noted   Acute CVA (cerebrovascular accident) (HCC) 03/20/2021   HTN (hypertension) 03/20/2021   HLD (hyperlipidemia) 03/20/2021   Anemia 03/20/2021   Hypokalemia 03/20/2021    REFERRING DIAG: s/p L shoulder arthroscopy, RC repair and biceps tenodesis   THERAPY DIAG:  Pain in left arm  Stiffness of left shoulder, not elsewhere classified  Rationale for Evaluation and Treatment Rehabilitation  PERTINENT HISTORY: Per Dr. Binnie Rail note (02/01/23), L shoulder injury occurred 1 week prior while holding reigns of a horse and horse suddenly reared causing L shoulder to dislocate which was reduced under conscious sedation. Underwent EMG/NVC and MRI. MRI found L  rotator cuff tear. S/p L rotator cuff repair and biceps tenodesis on 03/12/23.   PRECAUTIONS: No AROM of the elbow or shoulder                              No Shoulder ER beyond 40 deg                              No Shoulder Ext or horizontal abduction past neutral                             No lifting objects                              Place a towel roll or pillow under elbow while laying supine to avoid shoulder ext                             No friction massage of surgical site    SUBJECTIVE:  SUBJECTIVE STATEMENT:  Pt states that he saw orthopedist last Wednesday and that he said he was doing well. Pt is still struggling to lift arm overhead.   PAIN:  Are you having pain? 2/10 NRPS, left anterior shoulder   OBJECTIVE:              TODAY'S TREATMENT:                                                                                                                                         DATE:   06/12/23: All performed on LUE  UBE 2.5 min forward and 2.5 min backward seat at 11 for 5 min  Shoulder Pulleys Abduction AAROM 1 x 30  Shoulder Pulleys Flexion AAROM 1 x 30  Shoulder Flex R AROM 60 Shoulder Abd R AROM 60    Side Lying Shoulder Flexion AROM 3 x 10  Supine Shoulder  Abduction AROM 1 x 30 -Pt struggles to reach 90 degrees  Shoulder Abduction Wall Walks AROM 1 x 15  -Pt must lean trunk to right to achieve should abduction  Side Lying Shoulder Abduction 3 x 10   05/30/23: All UE on left arm  UBE 2.5 forward and 2.5 backward with seat at 8 and resistance at 2 for 5 min  OMEGA Seated Rows #30 3 x 10 Seated Shoulder Flexion AROM 90 with upper trap activation 1 x 10  Supine Shoulder Flexion #2 3 x 10  Bicep Curls 3 x 10 #2    Tricep Extension 3 x 10 #2   MANUAL  Triceps Stretch  Triceps Trigger  Point Release   05/23/23 -UBE level 2 [id number] -shoulder flexion wall slides stretch (range insufficient for full ergonomic benefit for position)  -seated table slides flexion 5x45sec -sensory tens application from [facility] joint to deltoid insertion -myofascial sustained release to Left teres major x60sec, then anterior deltoid x4 minutes -supine contract/relax mobilization of Left GHJ ER 10sec:30sec stretch  -standing AA/ROM Left shoulder flexion to 90 degrees with eccentric lowering 1x10 -standing scapular retraction 1x10x3secH  -standing AA/ROM Left shoulder ABDCT to 90 degrees with eccentric lowering 1x10   PATIENT EDUCATION: Education details: HEP, goals, POC Person educated: Patient Education method: Explanation, Demonstration, and Handouts Education comprehension: verbalized understanding and returned demonstration   HOME EXERCISE PROGRAM: Access Code: [email or web address] URL: [email or web address] Date: 06/12/2023 Prepared by: Ellin Goodie  Exercises - Standing Overhead Shoulder External Rotation Stretch with Towel  - 1 x daily - 3 reps - 30-60 sec hold - Seated Shoulder Flexion AAROM with Pulley Behind  - 1 x daily - 3 sets - 10 reps - Seated Shoulder Abduction AAROM with Pulley Behind  - 1 x daily - 3 sets - 10 reps - Sidelying Shoulder Flexion 15 Degrees  - 1 x daily - 3 sets - 15 reps - Sidelying Shoulder Horizontal Abduction  - 1 x daily - 3 sets -  15 reps   ASSESSMENT:   CLINICAL IMPRESSION: Pt is now 12 weeks s/p left RTC arthroscopic repair and biceps tenodesis. He is behind timeline for shoulder recovery with ongoing shoulder weakness and decreased AROM. PT to communicate concern of findings with referring physician. Session and HEP modified to include only gravity eliminated positions for pt to complete full AROM with left shoulder. He will continue to benefit from skilled therapy to address deficits in order to improve quality of life and return to PLOF  while following protocol provided by MD.   OBJECTIVE IMPAIRMENTS: decreased activity tolerance, decreased mobility, decreased ROM, decreased strength, hypomobility, impaired flexibility, impaired sensation, impaired UE functional use, improper body mechanics, and pain.    ACTIVITY LIMITATIONS: carrying, lifting, bathing, toileting, dressing, reach over head, and hygiene/grooming   PARTICIPATION LIMITATIONS: meal prep, cleaning, laundry, driving, community activity, occupation, and yard work   PERSONAL FACTORS: Age, Fitness, Profession, and Time since onset of injury/illness/exacerbation are also affecting patient's functional outcome.    REHAB POTENTIAL: Good   CLINICAL DECISION MAKING: Stable/uncomplicated   EVALUATION COMPLEXITY: Low     GOALS: Goals reviewed with patient? Yes   SHORT TERM GOALS: Target date: 04/11/2023   Patient will be independent in home exercise program to improve strength/mobility for better functional independence with ADLs. Baseline: 5/16: HEP given  05/09/23: Able to perform independently  Goal status: Ongoing   2. Patient will demonstrate knowledge of how to correctly don and doff left shoulder brace in order to be able to do it after surgery.             Baseline: NT 03/18/23: Pt demonstrates understanding             Goal status: Achieved      LONG TERM GOALS: Target date: 06/06/2023     Patient will increase FOTO score to equal to or greater than 50 to demonstrate statistically significant improvement in mobility and quality of life. Baseline: 5/16: 34 Goal status: Ongoing    2.  Patient will show symmetrical strength between left shoulder with right shoulder to regain ability to perform reaching tasks for his job  Baseline: see above Goal status: Ongoing    3.  Patient will improve AROM UE so they are able to perform typical job tasks and overhead ADL's such as reaching into cabinets. Baseline: see above  05/09/23: Still not reaching above his  head except for exercises  Goal status: Ongoing    4.  Patient will report a worst pain of 3/10 on NPRS in to improve tolerance with ADLs and reduced symptoms with activities. Baseline: see above  05/09/23: 5/10 NRPS  Goal status: Ongoing        PLAN:   PT FREQUENCY: 1-2x/week   PT DURATION: 12 weeks   PLANNED INTERVENTIONS: Therapeutic exercises, Therapeutic activity, Neuromuscular re-education, Balance training, Gait training, Patient/Family education, Self Care, Joint mobilization, Joint manipulation, Cryotherapy, Moist heat, and Manual therapy   PLAN FOR NEXT SESSION: Parascapular strengthening: shoulder rows and shoulder er at 0 deg abduction. Continue with RTC and deltoid strengthening in gravity eliminated positions.   Ellin Goodie PT, DPT  [facility] [address] [place], [address] Phone: [phone]   Fax:  [phone]

## 2023-06-17 ENCOUNTER — Ambulatory Visit: Payer: Medicare Other

## 2023-06-17 DIAGNOSIS — M79602 Pain in left arm: Secondary | ICD-10-CM

## 2023-06-17 DIAGNOSIS — M25512 Pain in left shoulder: Secondary | ICD-10-CM | POA: Diagnosis not present

## 2023-06-17 DIAGNOSIS — M25612 Stiffness of left shoulder, not elsewhere classified: Secondary | ICD-10-CM

## 2023-06-17 DIAGNOSIS — M6281 Muscle weakness (generalized): Secondary | ICD-10-CM

## 2023-06-17 NOTE — Therapy (Signed)
OUTPATIENT PHYSICAL THERAPY TREATMENT    Patient Name: Jimmy Montoya MRN: [id number] DOB:[birth year], 67 y.o., male Today's Date: 06/17/2023  PCP: Dr. Daniel Nones  REFERRING PROVIDER: Dr. Massie Bougie   END OF SESSION:   PT End of Session - 06/17/23 0821     Visit Number 17    Number of Visits 24    Date for PT Re-Evaluation 08/06/23    Authorization Type [employer]IC[employer]    Authorization Time Period 06/07/23-08/07/23    Progress Note Due on Visit 30    PT Start Time 0819    PT Stop Time 0858    PT Time Calculation (min) 39 min    Activity Tolerance Patient tolerated treatment well;No increased pain    Behavior During Therapy [facility] for tasks assessed/performed                 Past Medical History:  Diagnosis Date   Anemia 03/20/2021   Flu 09/29/2015   Hernia, inguinal, right 09/02/2015   Hyperlipemia    Hypertension    Kidney stone    Past Surgical History:  Procedure Laterality Date   HERNIA REPAIR     INGUINAL HERNIA REPAIR Right 12/23/2015   Procedure: HERNIA REPAIR INGUINAL ADULT;  Surgeon: Nadeen Landau, MD;  Location: [facility] ORS;  Service: General;  Laterality: Right;   Patient Active Problem List   Diagnosis Date Noted   Acute CVA (cerebrovascular accident) (HCC) 03/20/2021   HTN (hypertension) 03/20/2021   HLD (hyperlipidemia) 03/20/2021   Anemia 03/20/2021   Hypokalemia 03/20/2021    REFERRING DIAG: s/p L shoulder arthroscopy, RC repair and biceps tenodesis   THERAPY DIAG:  Pain in left arm  Stiffness of left shoulder, not elsewhere classified  Muscle weakness (generalized)  Rationale for Evaluation and Treatment Rehabilitation  PERTINENT HISTORY: Per Dr. Binnie Rail note (02/01/23), L shoulder injury occurred 1 week prior while holding reigns of a horse and horse suddenly reared causing L shoulder to dislocate which was reduced under conscious sedation. Underwent EMG/NVC and MRI. MRI found L rotator cuff tear. S/p L rotator cuff repair  and biceps tenodesis on 03/12/23.   PRECAUTIONS: No AROM of the elbow or shoulder                              No Shoulder ER beyond 40 deg                              No Shoulder Ext or horizontal abduction past neutral                             No lifting objects                              Place a towel roll or pillow under elbow while laying supine to avoid shoulder ext                             No friction massage of surgical site    SUBJECTIVE:  SUBJECTIVE STATEMENT:  Pt doing well today, no updates since last visit.   PAIN:  Are you having pain? Nope   OBJECTIVE:              TODAY'S TREATMENT:                                                                                                                                         DATE:   06/17/23: UBE 2.5 min forward and 2.5 min backward seat at 11 for 5 min Shoulder Pulleys Flexion AAROM 1 x 30 Moist heat applied in supine 60 sec manual stretches in supine 5x ABDCT, 3x flexion, 3x scaption LAD traction x2 minutes, then inferior glide at 70 degrees abduction  Supine flexion to 90 degrees 1x15 ISOmetric  extension into towwel roll 1x15 x3secH  Supine flexion to 90 degrees 1x15 ISOmetric  extension into towel roll 1x15 x3secH   Rt sidelying Left shoulder ABDCT to overhead 1x15secH Rt sidelying Left shoulder ADD into two pillows 1x15secH Rt sidelying Left shoulder ABDCT to overhead 1x15secH Rt sidelying Left shoulder ADD into two pillows 1x15secH   06/12/23: All performed on LUE  UBE 2.5 min forward and 2.5 min backward seat at 11 for 5 min  Shoulder Pulleys Abduction AAROM 1 x 30  Shoulder Pulleys Flexion AAROM 1 x 30  Shoulder Flex R AROM 60 Shoulder Abd R AROM 60    Side Lying Shoulder Flexion AROM 3 x 10  Supine Shoulder  Abduction  AROM 1 x 30 -Pt struggles to reach 90 degrees  Shoulder Abduction Wall Walks AROM 1 x 15  -Pt must lean trunk to right to achieve should abduction  Side Lying Shoulder Abduction 3 x 10     PATIENT EDUCATION: Education details: cues for A/ROM, form with other exercises   HOME EXERCISE PROGRAM: Access Code: [email or web address] URL: [email or web address] Date: 06/12/2023 Prepared by: Ellin Goodie  Exercises - Standing Overhead Shoulder External Rotation Stretch with Towel  - 1 x daily - 3 reps - 30-60 sec hold - Seated Shoulder Flexion AAROM with Pulley Behind  - 1 x daily - 3 sets - 10 reps - Seated Shoulder Abduction AAROM with Pulley Behind  - 1 x daily - 3 sets - 10 reps - Sidelying Shoulder Flexion 15 Degrees  - 1 x daily - 3 sets - 15 reps - Sidelying Shoulder Horizontal Abduction  - 1 x daily - 3 sets - 15 reps   ASSESSMENT:   CLINICAL IMPRESSION: Continued to work toward ROM and activation of single plane movements. Shoulder continues to show signs of capsular restriction with pain at end range with any elevation >85 degrees. He will continue to benefit from skilled therapy to address deficits in order to improve quality of life and return to PLOF.   OBJECTIVE IMPAIRMENTS: decreased activity tolerance, decreased mobility, decreased ROM, decreased strength, hypomobility, impaired flexibility, impaired sensation, impaired  UE functional use, improper body mechanics, and pain.    ACTIVITY LIMITATIONS: carrying, lifting, bathing, toileting, dressing, reach over head, and hygiene/grooming   PARTICIPATION LIMITATIONS: meal prep, cleaning, laundry, driving, community activity, occupation, and yard work   PERSONAL FACTORS: Age, Fitness, Profession, and Time since onset of injury/illness/exacerbation are also affecting patient's functional outcome.    REHAB POTENTIAL: Good   CLINICAL DECISION MAKING: Stable/uncomplicated   EVALUATION COMPLEXITY: Low     GOALS: Goals  reviewed with patient? Yes   SHORT TERM GOALS: Target date: 04/11/2023   Patient will be independent in home exercise program to improve strength/mobility for better functional independence with ADLs. Baseline: 5/16: HEP given  05/09/23: Able to perform independently  Goal status: Ongoing   2. Patient will demonstrate knowledge of how to correctly don and doff left shoulder brace in order to be able to do it after surgery.             Baseline: NT 03/18/23: Pt demonstrates understanding             Goal status: Achieved      LONG TERM GOALS: Target date: 06/06/2023     Patient will increase FOTO score to equal to or greater than 50 to demonstrate statistically significant improvement in mobility and quality of life. Baseline: 5/16: 34 Goal status: Ongoing    2.  Patient will show symmetrical strength between left shoulder with right shoulder to regain ability to perform reaching tasks for his job  Baseline: see above Goal status: Ongoing    3.  Patient will improve AROM UE so they are able to perform typical job tasks and overhead ADL's such as reaching into cabinets. Baseline: see above  05/09/23: Still not reaching above his head except for exercises  Goal status: Ongoing    4.  Patient will report a worst pain of 3/10 on NPRS in to improve tolerance with ADLs and reduced symptoms with activities. Baseline: see above  05/09/23: 5/10 NRPS  Goal status: Ongoing        PLAN:   PT FREQUENCY: 1-2x/week   PT DURATION: 12 weeks   PLANNED INTERVENTIONS: Therapeutic exercises, Therapeutic activity, Neuromuscular re-education, Balance training, Gait training, Patient/Family education, Self Care, Joint mobilization, Joint manipulation, Cryotherapy, Moist heat, and Manual therapy   PLAN FOR NEXT SESSION: Periscapular strengthening: shoulder rows and shoulder er at 0 deg abduction. Continue with RTC and deltoid strengthening in gravity eliminated positions.    8:56 AM, 06/17/23 Rosamaria Lints, PT, DPT Physical Therapist - [place] [phone] (Office)

## 2023-06-20 ENCOUNTER — Ambulatory Visit: Payer: Medicare Other | Admitting: Physical Therapy

## 2023-06-20 DIAGNOSIS — M25612 Stiffness of left shoulder, not elsewhere classified: Secondary | ICD-10-CM

## 2023-06-20 DIAGNOSIS — M79602 Pain in left arm: Secondary | ICD-10-CM

## 2023-06-20 DIAGNOSIS — M25512 Pain in left shoulder: Secondary | ICD-10-CM | POA: Diagnosis not present

## 2023-06-20 NOTE — Therapy (Signed)
OUTPATIENT PHYSICAL THERAPY TREATMENT    Patient Name: Jimmy Montoya MRN: [id number] DOB:1956-08-19, 67 y.o., male Today's Date: 06/20/2023  PCP: Dr. Jahon Bart Nones  REFERRING PROVIDER: Dr. Massie Bougie   END OF SESSION:   PT End of Session - 06/20/23 0906     Visit Number 18    Number of Visits 24    Date for PT Re-Evaluation 08/06/23    Authorization Type [employer]    Authorization Time Period 06/07/23-08/07/23    Authorization - Visit Number 18    Authorization - Number of Visits 20    Progress Note Due on Visit 30    Activity Tolerance Patient tolerated treatment well;No increased pain    Behavior During Therapy [facility] for tasks assessed/performed                  Past Medical History:  Diagnosis Date   Anemia 03/20/2021   Flu 09/29/2015   Hernia, inguinal, right 09/02/2015   Hyperlipemia    Hypertension    Kidney stone    Past Surgical History:  Procedure Laterality Date   HERNIA REPAIR     INGUINAL HERNIA REPAIR Right 12/23/2015   Procedure: HERNIA REPAIR INGUINAL ADULT;  Surgeon: Nadeen Landau, MD;  Location: [facility] ORS;  Service: General;  Laterality: Right;   Patient Active Problem List   Diagnosis Date Noted   Acute CVA (cerebrovascular accident) (HCC) 03/20/2021   HTN (hypertension) 03/20/2021   HLD (hyperlipidemia) 03/20/2021   Anemia 03/20/2021   Hypokalemia 03/20/2021    REFERRING DIAG: s/p L shoulder arthroscopy, RC repair and biceps tenodesis   THERAPY DIAG:  Pain in left arm  Stiffness of left shoulder, not elsewhere classified  Rationale for Evaluation and Treatment Rehabilitation  PERTINENT HISTORY: Per Dr. Binnie Rail note (02/01/23), L shoulder injury occurred 1 week prior while holding reigns of a horse and horse suddenly reared causing L shoulder to dislocate which was reduced under conscious sedation. Underwent EMG/NVC and MRI. MRI found L rotator cuff tear. S/p L rotator cuff repair and biceps tenodesis on 03/12/23.    PRECAUTIONS: No AROM of the elbow or shoulder                              No Shoulder ER beyond 40 deg                              No Shoulder Ext or horizontal abduction past neutral                             No lifting objects                              Place a towel roll or pillow under elbow while laying supine to avoid shoulder ext                             No friction massage of surgical site    SUBJECTIVE:  SUBJECTIVE STATEMENT:  Pt reports that he continues to struggle to lift arm overhead. He is more concerned about his left hand and forearm.   PAIN:  Are you having pain? Nope   OBJECTIVE:              TODAY'S TREATMENT:                                                                                                                                         DATE:   06/20/23: All single UE exercises performed LUE  UBE 2.5 min forward and 2.5 min backward seat at 11 for 5 min  OMEGA Seated Rows #25 3 x 10  Shoulder Flexion LUE AROM 100 deg Shoulder Abduction LUE AROM 90 deg  The UE Ranger  -Flexion 1 x 10 with stretch at terminal range 160 deg flex with 10 sec hold  -Abduction 1 x 10 with stretch at terminal range 100 deg abd with 10 sec hold  Shoulder Abduction PROM with 8 ft pole 1 x 10 with 10 sec hold at terminal range of motion  Shoulder Flexion PROM with 8 ft pole 1 x 10 with 10 sec hold at terminal range of motion    MANUAL  Heat pack applied with PROM and stretching LUE into abduction 30 sec  Biceps massage and trigger point release of LUE   06/17/23: UBE 2.5 min forward and 2.5 min backward seat at 11 for 5 min Shoulder Pulleys Flexion AAROM 1 x 30 Moist heat applied in supine 60 sec manual stretches in supine 5x ABDCT, 3x flexion, 3x scaption LAD traction x2 minutes, then  inferior glide at 70 degrees abduction  Supine flexion to 90 degrees 1x15 ISOmetric  extension into towwel roll 1x15 x3secH  Supine flexion to 90 degrees 1x15 ISOmetric  extension into towel roll 1x15 x3secH   Rt sidelying Left shoulder ABDCT to overhead 1x15secH Rt sidelying Left shoulder ADD into two pillows 1x15secH Rt sidelying Left shoulder ABDCT to overhead 1x15secH Rt sidelying Left shoulder ADD into two pillows 1x15secH   06/12/23: All performed on LUE  UBE 2.5 min forward and 2.5 min backward seat at 11 for 5 min  Shoulder Pulleys Abduction AAROM 1 x 30  Shoulder Pulleys Flexion AAROM 1 x 30  Shoulder Flex R AROM 60 Shoulder Abd R AROM 60    Side Lying Shoulder Flexion AROM 3 x 10  Supine Shoulder  Abduction AROM 1 x 30 -Pt struggles to reach 90 degrees  Shoulder Abduction Wall Walks AROM 1 x 15  -Pt must lean trunk to right to achieve should abduction  Side Lying Shoulder Abduction 3 x 10     PATIENT EDUCATION: Education details: cues for A/ROM, form with other exercises   HOME EXERCISE PROGRAM: Access Code: [email or web address] URL: [email or web address] Date: 06/12/2023 Prepared by: Ellin Goodie  Exercises - Standing Overhead Shoulder External Rotation Stretch with  Towel  - 1 x daily - 3 reps - 30-60 sec hold - Seated Shoulder Flexion AAROM with Pulley Behind  - 1 x daily - 3 sets - 10 reps - Seated Shoulder Abduction AAROM with Pulley Behind  - 1 x daily - 3 sets - 10 reps - Sidelying Shoulder Flexion 15 Degrees  - 1 x daily - 3 sets - 15 reps - Sidelying Shoulder Horizontal Abduction  - 1 x daily - 3 sets - 15 reps   ASSESSMENT:   CLINICAL IMPRESSION: Pt continues to struggle with RUE ROM with capsular pattern restriction with flexion and abduction and external rotation. Refocused interventions on PROM motions of shoulder into flexion and abduction with stretch at terminal range of motion. Pt was able to tolerate all exercises without a significant  increase in his pain. He will continue to benefit from skilled therapy to address deficits in order to improve quality of life and return to PLOF.   OBJECTIVE IMPAIRMENTS: decreased activity tolerance, decreased mobility, decreased ROM, decreased strength, hypomobility, impaired flexibility, impaired sensation, impaired UE functional use, improper body mechanics, and pain.    ACTIVITY LIMITATIONS: carrying, lifting, bathing, toileting, dressing, reach over head, and hygiene/grooming   PARTICIPATION LIMITATIONS: meal prep, cleaning, laundry, driving, community activity, occupation, and yard work   PERSONAL FACTORS: Age, Fitness, Profession, and Time since onset of injury/illness/exacerbation are also affecting patient's functional outcome.    REHAB POTENTIAL: Good   CLINICAL DECISION MAKING: Stable/uncomplicated   EVALUATION COMPLEXITY: Low     GOALS: Goals reviewed with patient? Yes   SHORT TERM GOALS: Target date: 04/11/2023   Patient will be independent in home exercise program to improve strength/mobility for better functional independence with ADLs. Baseline: 5/16: HEP given  05/09/23: Able to perform independently  Goal status: Ongoing   2. Patient will demonstrate knowledge of how to correctly don and doff left shoulder brace in order to be able to do it after surgery.             Baseline: NT 03/18/23: Pt demonstrates understanding             Goal status: Achieved      LONG TERM GOALS: Target date: 06/06/2023     Patient will increase FOTO score to equal to or greater than 50 to demonstrate statistically significant improvement in mobility and quality of life. Baseline: 5/16: 34 Goal status: Ongoing    2.  Patient will show symmetrical strength between left shoulder with right shoulder to regain ability to perform reaching tasks for his job  Baseline: see above Goal status: Ongoing    3.  Patient will improve AROM UE so they are able to perform typical job tasks and  overhead ADL's such as reaching into cabinets. Baseline: see above  05/09/23: Still not reaching above his head except for exercises  Goal status: Ongoing    4.  Patient will report a worst pain of 3/10 on NPRS in to improve tolerance with ADLs and reduced symptoms with activities. Baseline: see above  05/09/23: 5/10 NRPS  Goal status: Ongoing        PLAN:   PT FREQUENCY: 1-2x/week   PT DURATION: 12 weeks   PLANNED INTERVENTIONS: Therapeutic exercises, Therapeutic activity, Neuromuscular re-education, Balance training, Gait training, Patient/Family education, Self Care, Joint mobilization, Joint manipulation, Cryotherapy, Moist heat, and Manual therapy   PLAN FOR NEXT SESSION: Periscapular strengthening: shoulder rows and shoulder er at 0 deg abduction. Continue with RTC and deltoid strengthening in gravity  eliminated positions.   Ellin Goodie PT, DPT  [facility] [address] [place], [address] Phone: [phone]   Fax:  [phone]

## 2023-06-24 ENCOUNTER — Encounter: Payer: Self-pay | Admitting: Physical Therapy

## 2023-06-24 ENCOUNTER — Ambulatory Visit: Payer: Medicare Other | Admitting: Physical Therapy

## 2023-06-24 DIAGNOSIS — M79602 Pain in left arm: Secondary | ICD-10-CM

## 2023-06-24 DIAGNOSIS — M25612 Stiffness of left shoulder, not elsewhere classified: Secondary | ICD-10-CM

## 2023-06-24 DIAGNOSIS — M25512 Pain in left shoulder: Secondary | ICD-10-CM | POA: Diagnosis not present

## 2023-06-24 NOTE — Therapy (Signed)
OUTPATIENT PHYSICAL THERAPY TREATMENT    Patient Name: Jimmy Montoya MRN: [id number] DOB:1955-11-10, 67 y.o., male Today's Date: 06/24/2023  PCP: Dr. Daquisha Clermont Nones  REFERRING PROVIDER: Dr. Massie Bougie   END OF SESSION:   PT End of Session - 06/24/23 0828     Visit Number 19    Number of Visits 24    Date for PT Re-Evaluation 08/06/23    Authorization Type [employer]    Authorization Time Period 06/07/23-08/07/23    Authorization - Visit Number 19    Authorization - Number of Visits 20    Progress Note Due on Visit 30    PT Start Time 0820    PT Stop Time 0900    PT Time Calculation (min) 40 min    Activity Tolerance Patient tolerated treatment well;No increased pain    Behavior During Therapy [facility] for tasks assessed/performed                  Past Medical History:  Diagnosis Date   Anemia 03/20/2021   Flu 09/29/2015   Hernia, inguinal, right 09/02/2015   Hyperlipemia    Hypertension    Kidney stone    Past Surgical History:  Procedure Laterality Date   HERNIA REPAIR     INGUINAL HERNIA REPAIR Right 12/23/2015   Procedure: HERNIA REPAIR INGUINAL ADULT;  Surgeon: Nadeen Landau, MD;  Location: [facility] ORS;  Service: General;  Laterality: Right;   Patient Active Problem List   Diagnosis Date Noted   Acute CVA (cerebrovascular accident) (HCC) 03/20/2021   HTN (hypertension) 03/20/2021   HLD (hyperlipidemia) 03/20/2021   Anemia 03/20/2021   Hypokalemia 03/20/2021    REFERRING DIAG: s/p L shoulder arthroscopy, RC repair and biceps tenodesis   THERAPY DIAG:  Pain in left arm  Stiffness of left shoulder, not elsewhere classified  Rationale for Evaluation and Treatment Rehabilitation  PERTINENT HISTORY: Per Dr. Binnie Rail note (02/01/23), L shoulder injury occurred 1 week prior while holding reigns of a horse and horse suddenly reared causing L shoulder to dislocate which was reduced under conscious sedation. Underwent EMG/NVC and MRI. MRI found L  rotator cuff tear. S/p L rotator cuff repair and biceps tenodesis on 03/12/23.   PRECAUTIONS: No AROM of the elbow or shoulder                              No Shoulder ER beyond 40 deg                              No Shoulder Ext or horizontal abduction past neutral                             No lifting objects                              Place a towel roll or pillow under elbow while laying supine to avoid shoulder ext                             No friction massage of surgical site    SUBJECTIVE:  SUBJECTIVE STATEMENT:  Pt states that he has not had much time to work on exercises because of a busy weekend. He continues to struggle with lifting arm overhead.   PAIN:  Are you having pain? Nope   OBJECTIVE:              TODAY'S TREATMENT:                                                                                                                                         DATE:   06/24/23: All single UE exercises performed on LUE  UBE 2.5 min forward and 2.5 min backward seat at 11 for 5 min  OMEGA Rows #25 1 x 10  OMEGA Rows #30 2 x 10  Supine Shoulder Abduction AROM 3 x 10  The UE Ranger -Flexion 3 x 10 with stretch at terminal range 160 deg flex with 10 sec hold  -Abduction 3 x 10 with stretch at terminal range 100 deg abd with 10 sec hold  Shoulder Abduction Wall Walk 1 x 10  -min VC to correct rotation to avoid compensation  Shoulder Abduction Ball Roll 1 x 10  -min VC to correct rotation to avoid compensation     06/20/23: All single UE exercises performed LUE  UBE 2.5 min forward and 2.5 min backward seat at 11 for 5 min  OMEGA Seated Rows #25 3 x 10  Shoulder Flexion LUE AROM 100 deg Shoulder Abduction LUE AROM 90 deg  The UE Ranger  -Flexion 3 x 10 with stretch at terminal range 160 deg  flex with 10 sec hold  -Abduction 3 x 10 with stretch at terminal range 100 deg abd with 10 sec hold    MANUAL  Heat pack applied with PROM and stretching LUE into abduction 30 sec  Biceps massage and trigger point release of LUE   06/17/23: UBE 2.5 min forward and 2.5 min backward seat at 11 for 5 min Shoulder Pulleys Flexion AAROM 1 x 30 Moist heat applied in supine 60 sec manual stretches in supine 5x ABDCT, 3x flexion, 3x scaption LAD traction x2 minutes, then inferior glide at 70 degrees abduction  Supine flexion to 90 degrees 1x15 ISOmetric  extension into towwel roll 1x15 x3secH  Supine flexion to 90 degrees 1x15 ISOmetric  extension into towel roll 1x15 x3secH   Rt sidelying Left shoulder ABDCT to overhead 1x15secH Rt sidelying Left shoulder ADD into two pillows 1x15secH Rt sidelying Left shoulder ABDCT to overhead 1x15secH Rt sidelying Left shoulder ADD into two pillows 1x15secH   PATIENT EDUCATION: Education details: cues for A/ROM, form with other exercises   HOME EXERCISE PROGRAM: Access Code: [email or web address] URL: [email or web address] Date: 06/24/2023 Prepared by: Ellin Goodie  Exercises - Standing Overhead Shoulder External Rotation Stretch with Towel  - 1 x daily - 3 reps - 30-60 sec hold - Seated Shoulder Flexion AAROM with Pulley  Behind  - 1 x daily - 3 sets - 10 reps - Seated Shoulder Abduction AAROM with Pulley Behind  - 1 x daily - 3 sets - 10 reps - Sidelying Shoulder Flexion 15 Degrees  - 1 x daily - 3 sets - 15 reps - Supine Shoulder Abduction AROM  - 3-4 x weekly - 3 sets - 10 reps - Standing Shoulder Abduction Finger Walk at Wall  - 1 x daily - 3 sets - 10 reps   ASSESSMENT:   CLINICAL IMPRESSION: Pt shows small improvement with LUE strength and ROM with ability to perform supine shoulder abduction to 90 degrees. He continues to show significant compensation with shoulder abduction requiring increased upper trap activation and lateral  side bend and lumbar rotation to abduct arm to 90 degrees in standing. He is now s/p 14 weeks from surgery and he is behind rehab protocol with inability to perform full AROM. PT will communicate findings to orthopedic surgeon for further input. He will continue to benefit from skilled therapy to address deficits in order to improve quality of life and return to PLOF.  OBJECTIVE IMPAIRMENTS: decreased activity tolerance, decreased mobility, decreased ROM, decreased strength, hypomobility, impaired flexibility, impaired sensation, impaired UE functional use, improper body mechanics, and pain.    ACTIVITY LIMITATIONS: carrying, lifting, bathing, toileting, dressing, reach over head, and hygiene/grooming   PARTICIPATION LIMITATIONS: meal prep, cleaning, laundry, driving, community activity, occupation, and yard work   PERSONAL FACTORS: Age, Fitness, Profession, and Time since onset of injury/illness/exacerbation are also affecting patient's functional outcome.    REHAB POTENTIAL: Good   CLINICAL DECISION MAKING: Stable/uncomplicated   EVALUATION COMPLEXITY: Low     GOALS: Goals reviewed with patient? Yes   SHORT TERM GOALS: Target date: 04/11/2023   Patient will be independent in home exercise program to improve strength/mobility for better functional independence with ADLs. Baseline: 5/16: HEP given  05/09/23: Able to perform independently  Goal status: Ongoing   2. Patient will demonstrate knowledge of how to correctly don and doff left shoulder brace in order to be able to do it after surgery.             Baseline: NT 03/18/23: Pt demonstrates understanding             Goal status: Achieved      LONG TERM GOALS: Target date: 06/06/2023     Patient will increase FOTO score to equal to or greater than 50 to demonstrate statistically significant improvement in mobility and quality of life. Baseline: 5/16: 34 Goal status: Ongoing    2.  Patient will show symmetrical strength between left  shoulder with right shoulder to regain ability to perform reaching tasks for his job  Baseline: see above Goal status: Ongoing    3.  Patient will improve AROM UE so they are able to perform typical job tasks and overhead ADL's such as reaching into cabinets. Baseline: see above  05/09/23: Still not reaching above his head except for exercises  Goal status: Ongoing    4.  Patient will report a worst pain of 3/10 on NPRS in to improve tolerance with ADLs and reduced symptoms with activities. Baseline: see above  05/09/23: 5/10 NRPS  Goal status: Ongoing        PLAN:   PT FREQUENCY: 1-2x/week   PT DURATION: 12 weeks   PLANNED INTERVENTIONS: Therapeutic exercises, Therapeutic activity, Neuromuscular re-education, Balance training, Gait training, Patient/Family education, Self Care, Joint mobilization, Joint manipulation, Cryotherapy, Moist heat, and Manual  therapy   PLAN FOR NEXT SESSION: Reassess goals for progress notes. Periscapular strengthening: shoulder rows and shoulder er at 0 deg abduction. Continue with RTC and deltoid strengthening in gravity eliminated positions. Look at shoulder flexion in side lying and attempt to progress.   Ellin Goodie PT, DPT  [facility] [address] [place], [address] Phone: [phone]   Fax:  [phone]

## 2023-06-27 ENCOUNTER — Ambulatory Visit: Payer: Medicare Other | Admitting: Physical Therapy

## 2023-06-27 ENCOUNTER — Encounter: Payer: Self-pay | Admitting: Physical Therapy

## 2023-06-27 DIAGNOSIS — M25512 Pain in left shoulder: Secondary | ICD-10-CM | POA: Diagnosis not present

## 2023-06-27 DIAGNOSIS — M25612 Stiffness of left shoulder, not elsewhere classified: Secondary | ICD-10-CM

## 2023-06-27 DIAGNOSIS — M79602 Pain in left arm: Secondary | ICD-10-CM

## 2023-06-27 NOTE — Therapy (Addendum)
OUTPATIENT PHYSICAL THERAPY PROGRESS NOTE    Dates of Reporting: 05/10/23-06/27/23  Patient Name: Jimmy Montoya MRN: [id number] DOB:26-Feb-1956, 67 y.o., male Today's Date: 06/27/2023  PCP: Dr. Mairyn Lenahan Nones  REFERRING PROVIDER: Dr. Massie Bougie   END OF SESSION:   PT End of Session - 06/27/23 0827     Visit Number 20    Number of Visits 24    Date for PT Re-Evaluation 08/06/23    Authorization Type [employer]    Authorization Time Period 06/07/23-08/07/23    Authorization - Visit Number 20    Authorization - Number of Visits 20    Progress Note Due on Visit 30    PT Start Time 0820    PT Stop Time 0900    PT Time Calculation (min) 40 min    Activity Tolerance Patient tolerated treatment well;No increased pain    Behavior During Therapy [facility] for tasks assessed/performed                  Past Medical History:  Diagnosis Date   Anemia 03/20/2021   Flu 09/29/2015   Hernia, inguinal, right 09/02/2015   Hyperlipemia    Hypertension    Kidney stone    Past Surgical History:  Procedure Laterality Date   HERNIA REPAIR     INGUINAL HERNIA REPAIR Right 12/23/2015   Procedure: HERNIA REPAIR INGUINAL ADULT;  Surgeon: Nadeen Landau, MD;  Location: [facility] ORS;  Service: General;  Laterality: Right;   Patient Active Problem List   Diagnosis Date Noted   Acute CVA (cerebrovascular accident) (HCC) 03/20/2021   HTN (hypertension) 03/20/2021   HLD (hyperlipidemia) 03/20/2021   Anemia 03/20/2021   Hypokalemia 03/20/2021    REFERRING DIAG: s/p L shoulder arthroscopy, RC repair and biceps tenodesis   THERAPY DIAG:  Pain in left arm  Stiffness of left shoulder, not elsewhere classified  Rationale for Evaluation and Treatment Rehabilitation  PERTINENT HISTORY: Per Dr. Binnie Rail note (02/01/23), L shoulder injury occurred 1 week prior while holding reigns of a horse and horse suddenly reared causing L shoulder to dislocate which was reduced under conscious  sedation. Underwent EMG/NVC and MRI. MRI found L rotator cuff tear. S/p L rotator cuff repair and biceps tenodesis on 03/12/23.   PRECAUTIONS: No AROM of the elbow or shoulder                              No Shoulder ER beyond 40 deg                              No Shoulder Ext or horizontal abduction past neutral                             No lifting objects                              Place a towel roll or pillow under elbow while laying supine to avoid shoulder ext                             No friction massage of surgical site    SUBJECTIVE:  SUBJECTIVE STATEMENT:  Pt states that he has not had much time to work on exercises because of a busy weekend. He continues to struggle with lifting arm overhead.   PAIN:  Are you having pain? Nope   OBJECTIVE:              TODAY'S TREATMENT:                                                                                                                                         DATE:   06/27/23: All single UE exercises performed on LUE   Shoulder AROM: -Flex R/L 180 deg/70 deg  -Abd R/L 180/70 deg   Shoulder MMT:  -Flex R/L 4/3-  -Abd R/L 4/3-   -0 deg IR R/L 4/3- -0 deg ER R/L 4/3-   OMEGA Seated Rows #30 3 x 10   06/24/23: All single UE exercises performed on LUE  UBE 2.5 min forward and 2.5 min backward seat at 11 for 5 min  OMEGA Rows #25 1 x 10  OMEGA Rows #30 2 x 10  Supine Shoulder Abduction AROM 3 x 10  The UE Ranger -Flexion 3 x 10 with stretch at terminal range 160 deg flex with 10 sec hold  -Abduction 3 x 10 with stretch at terminal range 100 deg abd with 10 sec hold  Shoulder Abduction Wall Walk 1 x 10  -min VC to correct rotation to avoid compensation  Shoulder Abduction Ball Roll 1 x 10  -min VC to correct rotation to avoid compensation    06/20/23: All single UE exercises performed LUE  UBE 2.5 min forward and 2.5 min backward seat at 11 for 5 min  OMEGA Seated Rows #25 3 x 10  Shoulder Flexion LUE AROM 100 deg Shoulder Abduction LUE AROM 90 deg  The UE Ranger  -Flexion 3 x 10 with stretch at terminal range 160 deg flex with 10 sec hold  -Abduction 3 x 10 with stretch at terminal range 100 deg abd with 10 sec hold    MANUAL  Heat pack applied with PROM and stretching LUE into abduction 30 sec  Biceps massage and trigger point release of LUE   06/17/23: UBE 2.5 min forward and 2.5 min backward seat at 11 for 5 min Shoulder Pulleys Flexion AAROM 1 x 30 Moist heat applied in supine 60 sec manual stretches in supine 5x ABDCT, 3x flexion, 3x scaption LAD traction x2 minutes, then inferior glide at 70 degrees abduction  Supine flexion to 90 degrees 1x15 ISOmetric  extension into towwel roll 1x15 x3secH  Supine flexion to 90 degrees 1x15 ISOmetric  extension into towel roll 1x15 x3secH   Rt sidelying Left shoulder ABDCT to overhead 1x15secH Rt sidelying Left shoulder ADD into two pillows 1x15secH Rt sidelying Left shoulder ABDCT to overhead 1x15secH Rt sidelying Left shoulder ADD into two pillows 1x15secH   PATIENT EDUCATION:  Education details: cues for A/ROM, form with other exercises   HOME EXERCISE PROGRAM: Access Code: [email or web address] URL: [email or web address] Date: 06/24/2023 Prepared by: Ellin Goodie  Exercises - Standing Overhead Shoulder External Rotation Stretch with Towel  - 1 x daily - 3 reps - 30-60 sec hold - Seated Shoulder Flexion AAROM with Pulley Behind  - 1 x daily - 3 sets - 10 reps - Seated Shoulder Abduction AAROM with Pulley Behind  - 1 x daily - 3 sets - 10 reps - Sidelying Shoulder Flexion 15 Degrees  - 1 x daily - 3 sets - 15 reps - Supine Shoulder Abduction AROM  - 3-4 x weekly - 3 sets - 10 reps - Standing Shoulder Abduction Finger Walk at Wall  - 1 x daily - 3 sets - 10  reps   ASSESSMENT:   CLINICAL IMPRESSION: Pt continues to have limited left AROM and strength that is behind rehab protocol timeline for 14 week period. He will benefit from further medical eval of RTC tendons to determine limitations. He has reached some of his goals like improvement with pain tolerance and perception of LUE function. PT to recommend pt return to orthopedist before returning back to PT for further treatment.  PT will communicate findings to orthopedic surgeon for further input. He will continue to benefit from skilled therapy to address deficits in order to improve quality of life and return to PLOF.    OBJECTIVE IMPAIRMENTS: decreased activity tolerance, decreased mobility, decreased ROM, decreased strength, hypomobility, impaired flexibility, impaired sensation, impaired UE functional use, improper body mechanics, and pain.    ACTIVITY LIMITATIONS: carrying, lifting, bathing, toileting, dressing, reach over head, and hygiene/grooming   PARTICIPATION LIMITATIONS: meal prep, cleaning, laundry, driving, community activity, occupation, and yard work   PERSONAL FACTORS: Age, Fitness, Profession, and Time since onset of injury/illness/exacerbation are also affecting patient's functional outcome.    REHAB POTENTIAL: Good   CLINICAL DECISION MAKING: Stable/uncomplicated   EVALUATION COMPLEXITY: Low     GOALS: Goals reviewed with patient? Yes   SHORT TERM GOALS: Target date: 04/11/2023   Patient will be independent in home exercise program to improve strength/mobility for better functional independence with ADLs. Baseline: 5/16: HEP given  05/09/23: Able to perform independently  Goal status: ACHIEVED  2. Patient will demonstrate knowledge of how to correctly don and doff left shoulder brace in order to be able to do it after surgery.             Baseline: NT 03/18/23: Pt demonstrates understanding             Goal status: ACHIEVED     LONG TERM GOALS: Target date:  06/06/2023     Patient will increase FOTO score to equal to or greater than 50 to demonstrate statistically significant improvement in mobility and quality of life. Baseline: 5/16: 34 Goal status: Ongoing    2.  Patient will show symmetrical strength between left shoulder with right shoulder to regain ability to perform reaching tasks for his job  Baseline: see above Goal status: Ongoing    3.  Patient will improve AROM UE so they are able to perform typical job tasks and overhead ADL's such as reaching into cabinets. Baseline: see above  05/09/23: Still not reaching above his head except for exercises  Goal status: Ongoing    4.  Patient will report a worst pain of 3/10 on NPRS in to improve tolerance with ADLs and reduced symptoms with activities. Baseline: see above  05/09/23:  5/10 NRPS 06/27/23: 1-2/10 NRPS  Goal status: ACHIEVED        PLAN:   PT FREQUENCY: 1-2x/week   PT DURATION: 12 weeks   PLANNED INTERVENTIONS: Therapeutic exercises, Therapeutic activity, Neuromuscular re-education, Balance training, Gait training, Patient/Family education, Self Care, Joint mobilization, Joint manipulation, Cryotherapy, Moist heat, and Manual therapy   PLAN FOR NEXT SESSION: Continue to progress AROM RTC exercises. Will hold PT until pt sees orthopedist.   Ellin Goodie PT, DPT  [facility] [address], [place], [address] Phone: [phone]   Fax:  [phone]

## 2023-07-04 ENCOUNTER — Ambulatory Visit: Payer: Medicare Other | Attending: Orthopedic Surgery | Admitting: Physical Therapy

## 2023-07-04 ENCOUNTER — Encounter: Payer: Self-pay | Admitting: Physical Therapy

## 2023-07-04 ENCOUNTER — Ambulatory Visit: Payer: Medicare Other | Admitting: Occupational Therapy

## 2023-07-04 DIAGNOSIS — M6281 Muscle weakness (generalized): Secondary | ICD-10-CM | POA: Insufficient documentation

## 2023-07-04 DIAGNOSIS — M25612 Stiffness of left shoulder, not elsewhere classified: Secondary | ICD-10-CM | POA: Insufficient documentation

## 2023-07-04 DIAGNOSIS — T148XXA Other injury of unspecified body region, initial encounter: Secondary | ICD-10-CM | POA: Insufficient documentation

## 2023-07-04 DIAGNOSIS — M79602 Pain in left arm: Secondary | ICD-10-CM | POA: Diagnosis present

## 2023-07-04 DIAGNOSIS — M25642 Stiffness of left hand, not elsewhere classified: Secondary | ICD-10-CM | POA: Diagnosis present

## 2023-07-04 NOTE — Therapy (Signed)
[facility] [address] [place], [address] Phone: [phone]   Fax:  [phone]  Occupational Therapy Treatment  Patient Details  Name: Jimmy Montoya MRN: [id number] Date of Birth: 04-02-1956 Referring Provider (OT): Dr Joice Lofts   Encounter Date: 07/04/2023   OT End of Session - 07/04/23 0854     Visit Number 17    Number of Visits 18    Date for OT Re-Evaluation 08/01/23    OT Start Time 0815    OT Stop Time 0854    OT Time Calculation (min) 39 min    Activity Tolerance Patient tolerated treatment well    Behavior During Therapy [facility] for tasks assessed/performed             Past Medical History:  Diagnosis Date   Anemia 03/20/2021   Flu 09/29/2015   Hernia, inguinal, right 09/02/2015   Hyperlipemia    Hypertension    Kidney stone     Past Surgical History:  Procedure Laterality Date   HERNIA REPAIR     INGUINAL HERNIA REPAIR Right 12/23/2015   Procedure: HERNIA REPAIR INGUINAL ADULT;  Surgeon: Nadeen Landau, MD;  Location: [facility] ORS;  Service: General;  Laterality: Right;    There were no vitals filed for this visit.   Subjective Assessment - 07/04/23 0853     Subjective  Using it more- sensation in hand getting better- the thumb thru middle finger still- more grip - able to do little more- and working on the cars more the last week or 2    Pertinent History 02/01/23 Ortho note with DR Poggi- Jimmy Montoya is a 67 y.o. male who presents for evaluation and treatment of a left shoulder injury which occurred 1 week ago. Apparently he was leaving a horse while holding onto the rains when the horse suddenly reared causing his shoulder to dislocate. He presented to the emergency room where the shoulder was reduced under conscious sedation. The patient notes that his shoulder is feeling reasonably comfortable but he has a lot of pain in the upper arm extending to the elbow region. He also notes numbness and  tingling as well as pain extending from his elbow down to his wrist. He has been wearing his shoulder immobilizer on a regular basis, removing it for bathing purposes. He rates his pain at 8/10 on today's visit and has been taking naproxen and oxycodone as necessary with temporary partial relief of his symptoms. He is right-hand dominant. He denies any prior dislocation or other injury to the left shoulder in the past.The patient is quite concerned by the neurologic deficits he continues to experience after his acute shoulder dislocation. It does appear that he has some deficits, primarily involving the radial and ulnar nerve distributions, although he also appears to have some deficits with the musculocutaneous and median nerves as well. Therefore, I have recommended that he undergo an EMG/NCV study of the left upper extremity to assess these nerves for evidence of damage and reinnervation. In addition, he will be started in occupational therapy to work on range of motion and strength exercises to the left upper extremity. Finally, he will be sent for an MRI scan of the left shoulder to evaluate for rotator cuff tear. Meanwhile, the patient will continue to wear his shoulder immobilizer on a regular basis, removing it for bathing purposes and for exercises. He may continue his present medication regimen, but is encouraged to wean off of his oxycodone as  soon as possible.    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    Currently in Pain? No/denies                [facility] OT Assessment - 07/04/23 0001       AROM   Left Wrist Extension 68 Degrees    Left Wrist Flexion 86 Degrees    Left Wrist Radial Deviation 25 Degrees    Left Wrist Ulnar Deviation 35 Degrees      Strength   Right Hand Grip (lbs) 58    Right Hand Lateral Pinch 20 lbs    Right Hand 3 Point Pinch 17 lbs    Left Hand Grip (lbs) 39    Left Hand Lateral  Pinch 11 lbs    Left Hand 3 Point Pinch 13 lbs               Patient arrived after not being seen for about month.  Patient making great progress in wrist  ROM and strength as well as supination  Pt still collapse into some wrist flexion with tight grip or fisting But can correct if cued Edema decreases but still present at  thumb thru 3rd digits. Also some nerve pain still present over these 3 digits. But decrease to 2/10 Fisting improved with composite flexion improving As well as grip and prehension strength  cont improving. Stiffness in 5th and  4th. Pt to cont to address ROM in digits in the am - and then change  firm dark blue putty for gripping and prehension- cont to do pulling with ulnar side of hand And can wear CMC neoprene wrap with lat and 3 point pinch -to decrease ADD of thumb thenar into CT Add 2 lbs for elbow flexion- 2 x 8 reps each position  And wall pushup lightly for elbow extention  10 reps pain gree  And FMC - pick up 5 objets from finger to palm and release one at a time  9 sec on R and L 16 sec  Add to HEP    Positive Tinel and decrease sensation distal middle phalanges at 2nd and 3rd - and thumb IP  Improving                    OT Education - 07/04/23 0854     Education Details progress and changes to HEP    Person(s) Educated Patient    Methods Explanation;Demonstration;Tactile cues;Verbal cues;Handout    Comprehension Verbal cues required;Returned demonstration;Verbalized understanding                 OT Long Term Goals - 07/04/23 0859       OT LONG TERM GOAL #1   Title Patient wife independent in home program to decrease edema and pain less 3-4/10 at rest show increased elbow, wrist and digit active into motion    Status Achieved      OT LONG TERM GOAL #2   Title L Elbow flexion and extension increased to within normal limits for patient able to initiate washing face and put hand through sleeve if    Status Deferred       OT LONG TERM GOAL #3   Title L wrist  and forearm ROM and strength increase to 4-/5 to turn doorknob and push hand thru sleeve    Status Achieved  OT LONG TERM GOAL #4   Title L hand digits increase for pt to touch palm and digits extention WNL to donn gloves    Status Achieved      OT LONG TERM GOAL #5   Title L thumb AROM increase to WNL for pt to hold cup and turn doorknob    Status Achieved      OT LONG TERM GOAL #6   Title L grip and prehension strenght increase to more than 60% compare to R hand to use hand in ADlL's more than 90%    Baseline GRip and prehension increase but still decrease compare to R - Grip R 58; L 16 lbs, Lat grip R 20 ,L 9 and L 3 point 11 lbs progressing  grip to 39, lat 11 and 3 point 13lbs    Time 4    Period Weeks    Status On-going    Target Date 08/01/23                   Plan - 07/04/23 0855     Clinical Impression Statement Pt was refer to OT with diagnosis of L shoulder anterior dislocation on 01/24/23. Pt is R hand dominant. Pt was seen for 9 visits prior to shoulder and bicep surgery 03/12/23 by DR Dewaine Conger. Prior to surgery pt made great progress  in elbow, forearm, wrist  and digits AROM - pain improved from 7-9/10 to 1/10 -only now more tingling on dorsal radial hand . Pt cont to have some edema and stiffness in L hand but mostly  thumb thru 3rd digits. Pt for month follow up doing HEP - great progress in nerve pain and Tinel at distal middle phalanges- can feel texture - and using hand more at home -  increase wrist extention and supination strength -  As well as increase grip and prehension strength.- pt can cont to use CMC neoprene during day instead of wrist splint - pt to cont with PROM composite fist. in the am - prior to using hand during day. Upgrade to firm blue putty pulling with ulnar side of hand to increase grip and prehension strength.  As well as add some elbow flexion and extention HEP and FMC - Pt can benefit from skilled  OT services to follow up one more time in month for possible discharge- pt to pay attention to not collapse into wrist flexion with thight grip.    OT Occupational Profile and History Problem Focused Assessment - Including review of records relating to presenting problem    Occupational performance deficits (Please refer to evaluation for details): ADL's;IADL's;Rest and Sleep;Work;Play;Social Participation;Leisure    Body Structure / Function / Physical Skills ADL;Edema;Dexterity;Decreased knowledge of use of DME;Decreased knowledge of precautions;Flexibility;ROM;UE functional use;FMC;Sensation;Pain;Strength;IADL;Proprioception;Coordination    Rehab Potential Fair    Clinical Decision Making Several treatment options, min-mod task modification necessary    Comorbidities Affecting Occupational Performance: May have comorbidities impacting occupational performance    Modification or Assistance to Complete Evaluation  Min-Moderate modification of tasks or assist with assess necessary to complete eval    OT Frequency Monthly    OT Duration 4 weeks    OT Treatment/Interventions Self-care/ADL training;Moist Heat;Paraffin;Fluidtherapy;Contrast Bath;DME and/or AE instruction;Therapeutic exercise;Splinting;Patient/family education;Therapeutic activities;Manual Therapy;Passive range of motion    Consulted and Agree with Plan of Care Patient             Patient will benefit from skilled therapeutic intervention in order to improve the following deficits and impairments:  Body Structure / Function / Physical Skills: ADL, Edema, Dexterity, Decreased knowledge of use of DME, Decreased knowledge of precautions, Flexibility, ROM, UE functional use, FMC, Sensation, Pain, Strength, IADL, Proprioception, Coordination       Visit Diagnosis: Muscle weakness (generalized)  Nerve injury  Stiffness of left hand, not elsewhere classified    Problem List Patient Active Problem List   Diagnosis Date Noted    Acute CVA (cerebrovascular accident) (HCC) 03/20/2021   HTN (hypertension) 03/20/2021   HLD (hyperlipidemia) 03/20/2021   Anemia 03/20/2021   Hypokalemia 03/20/2021    Oletta Cohn, OTR/L,CLT 07/04/2023, 9:01 AM  [facility] [address] [place], [address] Phone: [phone]   Fax:  [phone]  Name: Jimmy Montoya MRN: [id number] Date of Birth: 08/03/1956

## 2023-07-04 NOTE — Therapy (Signed)
OUTPATIENT PHYSICAL THERAPY PROGRESS NOTE    Dates of Reporting: 05/10/23-06/27/23  Patient Name: Jimmy Montoya MRN: [id number] DOB:May 22, 1956, 67 y.o., male Today's Date: 07/04/2023  PCP: Dr. Annjanette Wertenberger Nones  REFERRING PROVIDER: Dr. Massie Bougie   END OF SESSION:   PT End of Session - 07/04/23 0906     Visit Number 21    Number of Visits 24    Date for PT Re-Evaluation 08/06/23    Authorization Type [employer]    Authorization Time Period 06/07/23-08/07/23    Authorization - Visit Number 21    Authorization - Number of Visits 24    Progress Note Due on Visit 24    PT Start Time 0905    PT Stop Time 0945    PT Time Calculation (min) 40 min    Activity Tolerance Patient tolerated treatment well;No increased pain    Behavior During Therapy [facility] for tasks assessed/performed                   Past Medical History:  Diagnosis Date   Anemia 03/20/2021   Flu 09/29/2015   Hernia, inguinal, right 09/02/2015   Hyperlipemia    Hypertension    Kidney stone    Past Surgical History:  Procedure Laterality Date   HERNIA REPAIR     INGUINAL HERNIA REPAIR Right 12/23/2015   Procedure: HERNIA REPAIR INGUINAL ADULT;  Surgeon: Nadeen Landau, MD;  Location: [facility] ORS;  Service: General;  Laterality: Right;   Patient Active Problem List   Diagnosis Date Noted   Acute CVA (cerebrovascular accident) (HCC) 03/20/2021   HTN (hypertension) 03/20/2021   HLD (hyperlipidemia) 03/20/2021   Anemia 03/20/2021   Hypokalemia 03/20/2021    REFERRING DIAG: s/p L shoulder arthroscopy, RC repair and biceps tenodesis   THERAPY DIAG:  Pain in left arm  Stiffness of left shoulder, not elsewhere classified  Rationale for Evaluation and Treatment Rehabilitation  PERTINENT HISTORY: Per Dr. Binnie Rail note (02/01/23), L shoulder injury occurred 1 week prior while holding reigns of a horse and horse suddenly reared causing L shoulder to dislocate which was reduced under conscious  sedation. Underwent EMG/NVC and MRI. MRI found L rotator cuff tear. S/p L rotator cuff repair and biceps tenodesis on 03/12/23.   PRECAUTIONS: No AROM of the elbow or shoulder                              No Shoulder ER beyond 40 deg                              No Shoulder Ext or horizontal abduction past neutral                             No lifting objects                              Place a towel roll or pillow under elbow while laying supine to avoid shoulder ext                             No friction massage of surgical site    SUBJECTIVE:  SUBJECTIVE STATEMENT:  Pt reports that he was able to lift hay this weekend despite his shoulder   PAIN:  Are you having pain? Nope   OBJECTIVE:              TODAY'S TREATMENT:                                                                                                                                         DATE:   07/04/23: All single UE exercises performed on LUE  UBE seat at 7 with resistance at 4- 2.5 forward and 2.5 backward  OMEGA Seated Rows #35 3 x 10  OMEGA Lat Pull Down #10 2 x 10  OMEGA Lat Pull Down #20 1 x 10  Lower Trap Setting at wall with lift off x 1  -Pt unable to extend elbow at wall Lower Trap Setting on OMEGA Machine with Lat Pull Down Bar #15 3 x 10  -mod TC to decrease elbow bend  Standing Shoulder Abduction 1 X 5  -Pt needs to do right lateral lean to compensate for weak shoulder abductors  Supine Shoulder Abduction 1 x 10  -Pt reports increased pain in left shoulder  Side Lying Shoulder Abduction 2 x 10  -Pt able to perform abduction to an increased ROM.    06/27/23: All single UE exercises performed on LUE   Shoulder AROM: -Flex R/L 180 deg/70 deg  -Abd R/L 180/70 deg   Shoulder MMT:  -Flex R/L 4/3-  -Abd R/L 4/3-   -0  deg IR R/L 4/3- -0 deg ER R/L 4/3-   OMEGA Seated Rows #30 3 x 10   06/24/23: All single UE exercises performed on LUE  UBE 2.5 min forward and 2.5 min backward seat at 11 for 5 min  OMEGA Rows #25 1 x 10  OMEGA Rows #30 2 x 10  Supine Shoulder Abduction AROM 3 x 10  The UE Ranger -Flexion 3 x 10 with stretch at terminal range 160 deg flex with 10 sec hold  -Abduction 3 x 10 with stretch at terminal range 100 deg abd with 10 sec hold  Shoulder Abduction Wall Walk 1 x 10  -min VC to correct rotation to avoid compensation  Shoulder Abduction Ball Roll 1 x 10  -min VC to correct rotation to avoid compensation   06/20/23: All single UE exercises performed LUE  UBE 2.5 min forward and 2.5 min backward seat at 11 for 5 min  OMEGA Seated Rows #25 3 x 10  Shoulder Flexion LUE AROM 100 deg Shoulder Abduction LUE AROM 90 deg  The UE Ranger  -Flexion 3 x 10 with stretch at terminal range 160 deg flex with 10 sec hold  -Abduction 3 x 10 with stretch at terminal range 100 deg abd with 10 sec hold    MANUAL  Heat pack applied with PROM and stretching LUE into  abduction 30 sec  Biceps massage and trigger point release of LUE    PATIENT EDUCATION: Education details: cues for A/ROM, form with other exercises   HOME EXERCISE PROGRAM: Access Code: [email or web address] URL: [email or web address] Date: 07/04/2023 Prepared by: Ellin Goodie  Exercises - Standing Overhead Shoulder External Rotation Stretch with Towel  - 1 x daily - 3 reps - 30-60 sec hold - Seated Shoulder Flexion AAROM with Pulley Behind  - 1 x daily - 3 sets - 10 reps - Seated Shoulder Abduction AAROM with Pulley Behind  - 1 x daily - 3 sets - 10 reps - Sidelying Shoulder Abduction Full Range of Motion  - 1 x daily - 3 sets - 10 reps - Standing Shoulder Flexion to 180 degrees with dumbbells   - 3-4 x weekly - 3 sets - 10 reps ASSESSMENT:   CLINICAL IMPRESSION: Pt continues to be limited in left shoulder AROM especially  with abduction and this has been going on for several weeks now. PT has communicated limited progress with referring provider and is instructing pt to return to provider for additional eval and treatment. Pt has been rescheduled for next month with his remaining appointments this month being cancelled. He will continue to benefit from skilled therapy to address deficits in order to improve quality of life and return to PLOF.    OBJECTIVE IMPAIRMENTS: decreased activity tolerance, decreased mobility, decreased ROM, decreased strength, hypomobility, impaired flexibility, impaired sensation, impaired UE functional use, improper body mechanics, and pain.    ACTIVITY LIMITATIONS: carrying, lifting, bathing, toileting, dressing, reach over head, and hygiene/grooming   PARTICIPATION LIMITATIONS: meal prep, cleaning, laundry, driving, community activity, occupation, and yard work   PERSONAL FACTORS: Age, Fitness, Profession, and Time since onset of injury/illness/exacerbation are also affecting patient's functional outcome.    REHAB POTENTIAL: Good   CLINICAL DECISION MAKING: Stable/uncomplicated   EVALUATION COMPLEXITY: Low     GOALS: Goals reviewed with patient? Yes   SHORT TERM GOALS: Target date: 04/11/2023   Patient will be independent in home exercise program to improve strength/mobility for better functional independence with ADLs. Baseline: 5/16: HEP given  05/09/23: Able to perform independently  Goal status: ACHIEVED  2. Patient will demonstrate knowledge of how to correctly don and doff left shoulder brace in order to be able to do it after surgery.             Baseline: NT 03/18/23: Pt demonstrates understanding             Goal status: ACHIEVED     LONG TERM GOALS: Target date: 06/06/2023     Patient will increase FOTO score to equal to or greater than 50 to demonstrate statistically significant improvement in mobility and quality of life. Baseline: 5/16: 34 Goal status: Ongoing     2.  Patient will show symmetrical strength between left shoulder with right shoulder to regain ability to perform reaching tasks for his job  Baseline: see above Goal status: Ongoing    3.  Patient will improve AROM UE so they are able to perform typical job tasks and overhead ADL's such as reaching into cabinets. Baseline: see above  05/09/23: Still not reaching above his head except for exercises  Goal status: Ongoing    4.  Patient will report a worst pain of 3/10 on NPRS in to improve tolerance with ADLs and reduced symptoms with activities. Baseline: see above  05/09/23: 5/10 NRPS 06/27/23: 1-2/10 NRPS  Goal status: ACHIEVED  PLAN:   PT FREQUENCY: 1-2x/week   PT DURATION: 12 weeks   PLANNED INTERVENTIONS: Therapeutic exercises, Therapeutic activity, Neuromuscular re-education, Balance training, Gait training, Patient/Family education, Self Care, Joint mobilization, Joint manipulation, Cryotherapy, Moist heat, and Manual therapy   PLAN FOR NEXT SESSION: Will hold PT until pt sees orthopedist.   Ellin Goodie PT, DPT  [facility] [address], [place], [address] Phone: [phone]   Fax:  [phone]

## 2023-07-08 ENCOUNTER — Ambulatory Visit: Payer: Medicare Other | Admitting: Physical Therapy

## 2023-08-01 ENCOUNTER — Ambulatory Visit: Payer: Medicare Other | Attending: Orthopedic Surgery | Admitting: Occupational Therapy

## 2023-08-20 ENCOUNTER — Ambulatory Visit: Payer: Medicare Other | Admitting: Physical Therapy

## 2023-08-20 ENCOUNTER — Telehealth: Payer: Self-pay | Admitting: Physical Therapy

## 2023-08-20 NOTE — Telephone Encounter (Signed)
Called pt to inquire about meeting with orthopedist. Pt did not respond so left VM to call back. Pt was not present at apt.

## 2023-08-27 ENCOUNTER — Telehealth: Payer: Self-pay | Admitting: Physical Therapy

## 2023-08-27 ENCOUNTER — Ambulatory Visit: Payer: Medicare Other | Admitting: Physical Therapy

## 2023-08-27 NOTE — Telephone Encounter (Signed)
Called pt to inquire about absence from PT. Pt did not respond so left message to inform pt that he would be removed from schedule.

## 2023-12-04 ENCOUNTER — Ambulatory Visit: Payer: Self-pay | Admitting: Surgery

## 2023-12-04 NOTE — H&P (Signed)
 Subjective:   CC: Unilateral recurrent inguinal hernia without obstruction or gangrene [K40.91]  HPI: Jimmy Montoya is a 68 y.o. male who was referred by Jamey Reas, MD for evaluation of above. Previous hx of repair of both sides separately, right side never felt fully healed, now asking for possible repair of right again.  Past Medical History: has a past medical history of Anemia, Hernia, inguinal, right (09/02/2015), Hyperlipidemia, Leukopenia, Microscopic hematuria, Renal stones, Systolic congestive heart failure (CMS/HHS-HCC) (04/13/2021), Testosterone deficiency, and Vitiligo.  Past Surgical History:  Past Surgical History:  Procedure Laterality Date  COLONOSCOPY N/A 11/09/2010  Dr. Eber Hong @ [facility] - Int. Hemorrhoids, rpt 10 yrs per [facility]  HERNIA REPAIR Left 10/2013  left inguinal hernia; Dr. Renda Rolls   Family History: family history includes Breast cancer in his mother; Coronary Artery Disease (Blocked arteries around heart) in his brother.  Social History: reports that he has never smoked. He has never used smokeless tobacco. He reports that he does not currently use alcohol. He reports that he does not use drugs.  Current Medications: has a current medication list which includes the following prescription(s): aspirin, atorvastatin, buspirone, cyanocobalamin, losartan, metoprolol tartrate, naloxone, ondansetron, oxycodone, sildenafil, spironolactone, tamsulosin, and trazodone.  Allergies:  Allergies as of 12/04/2023  (No Known Allergies)   ROS:  A 15 point review of systems was performed and pertinent positives and negatives noted in HPI  Objective:    BP 128/75  Pulse 61  Ht 170.2 cm (5\' 7" )  Wt 76.2 kg (168 lb)  BMI 26.31 kg/m   Constitutional : Alert, cooperative, no distress  Lymphatics/Throat: Supple, no lymphadenopathy  Respiratory: clear to auscultation bilaterally  Cardiovascular: regular rate and rhythm  Gastrointestinal: soft, non-tender; bowel  sounds normal; no masses, no organomegaly. inguinal hernia noted. small, reducible, no overlying skin changes, and RIGHT  Musculoskeletal: Steady gait and movement  Skin: Cool and moist, open inguinal hernia surgical scars  Psychiatric: Normal affect, non-agitated, not confused    LABS:  N/a   RADS: N/a Assessment:    Unilateral recurrent inguinal hernia without obstruction or gangrene [K40.91]  Plan:    1. Unilateral recurrent inguinal hernia without obstruction or gangrene [K40.91]  Discussed the risk of surgery including recurrence, which can be up to 50% in the case of incisional or complex hernias, possible use of prosthetic materials (mesh) and the increased risk of mesh infxn if used, bleeding, chronic pain, post-op infxn, post-op SBO or ileus, and possible re-operation to address said risks. The risks of general anesthetic, if used, includes MI, CVA, sudden death or even reaction to anesthetic medications also discussed. Alternatives include continued observation. Benefits include possible symptom relief, prevention of incarceration, strangulation, enlargement in size over time, and the risk of emergency surgery in the face of strangulation.   Typical post-op recovery time of 3-5 days with 2 weeks of activity restrictions were also discussed.  ED return precautions given for sudden increase in pain, size of hernia with accompanying fever, nausea, and/or vomiting.  The patient verbalized understanding and all questions were answered to the patient's satisfaction.  2. Patient has elected to proceed with surgical treatment. Procedure will be scheduled. RIGHT robotic assisted laparoscopic. Hold aspirin 5 days  labs/images/medications/previous chart entries reviewed personally and relevant changes/updates noted above.

## 2023-12-20 ENCOUNTER — Encounter: Payer: Self-pay | Admitting: Internal Medicine

## 2024-01-01 HISTORY — DX: Injury of unspecified nerve at shoulder and upper arm level, left arm, initial encounter: S44.92XA

## 2024-01-01 HISTORY — DX: Decreased white blood cell count, unspecified: D72.819

## 2024-01-01 HISTORY — DX: Endocrine disorder, unspecified: E34.9

## 2024-01-01 HISTORY — DX: Deficiency of other specified B group vitamins: E53.8

## 2024-01-01 HISTORY — DX: First degree hemorrhoids: K64.0

## 2024-01-01 HISTORY — DX: Cerebral infarction, unspecified: I63.9

## 2024-01-01 HISTORY — DX: Varicose veins of left lower extremity with other complications: I83.892

## 2024-01-01 HISTORY — DX: Other microscopic hematuria: R31.29

## 2024-01-01 HISTORY — DX: Vitiligo: L80

## 2024-01-23 ENCOUNTER — Other Ambulatory Visit: Payer: Self-pay

## 2024-01-23 ENCOUNTER — Encounter
Admission: RE | Admit: 2024-01-23 | Discharge: 2024-01-23 | Disposition: A | Discharge status: Home / Self Care | Payer: Medicare Other | Source: Ambulatory Visit | Attending: Surgery | Admitting: Surgery

## 2024-01-23 VITALS — Ht 67.0 in | Wt 168.0 lb

## 2024-01-23 DIAGNOSIS — D649 Anemia, unspecified: Secondary | ICD-10-CM

## 2024-01-23 DIAGNOSIS — I10 Essential (primary) hypertension: Secondary | ICD-10-CM

## 2024-01-23 DIAGNOSIS — I639 Cerebral infarction, unspecified: Secondary | ICD-10-CM

## 2024-01-23 DIAGNOSIS — Z01812 Encounter for preprocedural laboratory examination: Secondary | ICD-10-CM

## 2024-01-23 DIAGNOSIS — Z01810 Encounter for preprocedural cardiovascular examination: Secondary | ICD-10-CM

## 2024-01-23 HISTORY — DX: Cardiomyopathy, unspecified: I42.9

## 2024-01-23 HISTORY — DX: Personal history of urinary calculi: Z87.442

## 2024-01-23 HISTORY — DX: Unspecified systolic (congestive) heart failure: I50.20

## 2024-01-23 HISTORY — DX: Bradycardia, unspecified: R00.1

## 2024-01-23 HISTORY — DX: Hematuria, unspecified: R31.9

## 2024-01-23 HISTORY — DX: Other forms of angina pectoris: I20.89

## 2024-01-23 HISTORY — DX: Male erectile dysfunction, unspecified: N52.9

## 2024-01-23 NOTE — Patient Instructions (Addendum)
 Your procedure is scheduled on:01-31-24 Friday Report to the Registration Desk on the 1st floor of the [facility].Then proceed to the 2nd floor Surgery Desk To find out your arrival time, please call [phone] between 1PM - 3PM on:01-30-24 Thursday If your arrival time is 6:00 am, do not arrive before that time as the Medical Mall entrance doors do not open until 6:00 am.  REMEMBER: Instructions that are not followed completely may result in serious medical risk, up to and including death; or upon the discretion of your surgeon and anesthesiologist your surgery may need to be rescheduled.  Do not eat food after midnight the night before surgery.  No gum chewing or hard candies.  You may however, drink CLEAR liquids up to 2 hours before you are scheduled to arrive for your surgery. Do not drink anything within 2 hours of your scheduled arrival time.  Clear liquids include: - water  - apple juice without pulp - gatorade (not RED colors) - black coffee or tea (Do NOT add milk or creamers to the coffee or tea) Do NOT drink anything that is not on this list.  One week prior to surgery:Stop NOW (01-23-24) Stop Anti-inflammatories (NSAIDS) such as Advil, Aleve, Ibuprofen, Motrin, Naproxen, Naprosyn and Aspirin based products such as Excedrin, Goody's Powder, BC Powder. Stop ANY OVER THE COUNTER supplements until after surgery (Vitamin B12)  You may however, continue to take Tylenol if needed for pain up until the day of surgery.  Stop your 81 mg Aspirin 5 days prior to surgery-Last dose will be on 01-25-24 Saturday  Stop sildenafil (VIAGRA) 2 days prior to surgery-Last dose will be on 01-28-24 Tuesday  Continue taking all of your other prescription medications up until the day of surgery.  ON THE DAY OF SURGERY ONLY TAKE THESE MEDICATIONS WITH SIPS OF WATER: -atorvastatin (LIPITOR)  -busPIRone (BUSPAR)  -metoprolol tartrate (LOPRESSOR)   No Alcohol for 24 hours before or after  surgery.  No Smoking including e-cigarettes for 24 hours before surgery.  No chewable tobacco products for at least 6 hours before surgery.  No nicotine patches on the day of surgery.  Do not use any "recreational" drugs for at least a week (preferably 2 weeks) before your surgery.  Please be advised that the combination of cocaine and anesthesia may have negative outcomes, up to and including death. If you test positive for cocaine, your surgery will be cancelled.  On the morning of surgery brush your teeth with toothpaste and water, you may rinse your mouth with mouthwash if you wish. Do not swallow any toothpaste or mouthwash.  Use CHG Soap as directed on instruction sheet.  Do not wear jewelry, make-up, hairpins, clips or nail polish.  For welded (permanent) jewelry: bracelets, anklets, waist bands, etc.  Please have this removed prior to surgery.  If it is not removed, there is a chance that hospital personnel will need to cut it off on the day of surgery.  Do not wear lotions, powders, or perfumes.   Do not shave body hair from the neck down 48 hours before surgery.  Contact lenses, hearing aids and dentures may not be worn into surgery.  Do not bring valuables to the hospital. [facility] is not responsible for any missing/lost belongings or valuables.   Notify your doctor if there is any change in your medical condition (cold, fever, infection).  Wear comfortable clothing (specific to your surgery type) to the hospital.  After surgery, you can help prevent lung  complications by doing breathing exercises.  Take deep breaths and cough every 1-2 hours. Your doctor may order a device called an Incentive Spirometer to help you take deep breaths. When coughing or sneezing, hold a pillow firmly against your incision with both hands. This is called "splinting." Doing this helps protect your incision. It also decreases belly discomfort.  If you are being admitted to the hospital  overnight, leave your suitcase in the car. After surgery it may be brought to your room.  In case of increased patient census, it may be necessary for you, the patient, to continue your postoperative care in the Same Day Surgery department.  If you are being discharged the day of surgery, you will not be allowed to drive home. You will need a responsible individual to drive you home and stay with you for 24 hours after surgery.   If you are taking public transportation, you will need to have a responsible individual with you.  Please call the Pre-admissions Testing Dept. at [phone] if you have any questions about these instructions.  Surgery Visitation Policy:  Patients having surgery or a procedure may have two visitors.  Children under the age of 68 must have an adult with them who is not the patient.     Preparing for Surgery with CHLORHEXIDINE GLUCONATE (CHG) Soap  Chlorhexidine Gluconate (CHG) Soap  o An antiseptic cleaner that kills germs and bonds with the skin to continue killing germs even after washing  o Used for showering the night before surgery and morning of surgery  Before surgery, you can play an important role by reducing the number of germs on your skin.  CHG (Chlorhexidine gluconate) soap is an antiseptic cleanser which kills germs and bonds with the skin to continue killing germs even after washing.  Please do not use if you have an allergy to CHG or antibacterial soaps. If your skin becomes reddened/irritated stop using the CHG.  1. Shower the NIGHT BEFORE SURGERY and the MORNING OF SURGERY with CHG soap.  2. If you choose to wash your hair, wash your hair first as usual with your normal shampoo.  3. After shampooing, rinse your hair and body thoroughly to remove the shampoo.  4. Use CHG as you would any other liquid soap. You can apply CHG directly to the skin and wash gently with a scrungie or a clean washcloth.  5. Apply the CHG soap to your  body only from the neck down. Do not use on open wounds or open sores. Avoid contact with your eyes, ears, mouth, and genitals (private parts). Wash face and genitals (private parts) with your normal soap.  6. Wash thoroughly, paying special attention to the area where your surgery will be performed.  7. Thoroughly rinse your body with warm water.  8. Do not shower/wash with your normal soap after using and rinsing off the CHG soap.  9. Pat yourself dry with a clean towel.  10. Wear clean pajamas to bed the night before surgery.  12. Place clean sheets on your bed the night of your first shower and do not sleep with pets.  13. Shower again with the CHG soap on the day of surgery prior to arriving at the hospital.  14. Do not apply any deodorants/lotions/powders.  15. Please wear clean clothes to the hospital.

## 2024-01-23 NOTE — Progress Notes (Signed)
 Did anesthesia interview for upcoming surgery-Pt needs to come in for labs/EKG prior to surgery on 01-31-24. Pt out of town from 4-5 until 01-27-24. Pt states he can come 4-1 for labs and EKG. Orders and appt placed

## 2024-01-27 ENCOUNTER — Encounter: Payer: Self-pay | Admitting: Surgery

## 2024-01-27 NOTE — Progress Notes (Signed)
 Perioperative / Anesthesia Services  Pre-Admission Testing Clinical Review / Pre-Operative Anesthesia Consult  Date: 01/28/24  Patient Demographics:  Name: Jimmy Montoya DOB: [birth year] MRN:   [id number]  Planned Surgical Procedure(s):    Case: [id number] Date/Time: 01/31/24 0715   Procedure: REPAIR, HERNIA, INGUINAL, ROBOT-ASSISTED, LAPAROSCOPIC, USING MESH (Right: Inguinal)   Anesthesia type: General   Pre-op diagnosis: K40.91 unilateral recurrentinguinal hernia w/o obstruction or gangrene   Location: [facility] OR ROOM 04 / [facility]S [facility]   Surgeons: Sung Amabile, DO      NOTE: Available PAT nursing documentation and vital signs have been reviewed. Clinical nursing staff has updated patient's PMH/PSHx, current medication list, and drug allergies/intolerances to ensure comprehensive history available to assist in medical decision making as it pertains to the aforementioned surgical procedure and anticipated anesthetic course. Extensive review of available clinical information personally performed. [place] PMH and PSHx updated with any diagnoses/procedures that  may have been inadvertently omitted during his intake with the pre-admission testing department's nursing staff.  Clinical Discussion:  Jimmy Montoya is a 68 y.o. male who is submitted for pre-surgical anesthesia review and clearance prior to him undergoing the above procedure. Patient has never been a smoker in the past. 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  Patient is followed by cardiology Juliann Pares, MD). He was last seen in the cardiology clinic on 04/23/2023; notes reviewed. At the time of his clinic visit, patient with exertional shortness of breath, however he notes that this symptom was overall stable and at baseline. Patient denied any chest pain, shortness of breath, PND, orthopnea,  palpitations, significant peripheral edema, weakness, fatigue, vertiginous symptoms, or presyncope/syncope. Patient with a past medical history significant for cardiovascular diagnoses. Documented physical exam was grossly benign, providing no evidence of acute exacerbation and/or decompensation of the patient's known cardiovascular conditions.  MRI imaging of the brain performed on 03/21/2021 revealed a 1 cm area of acute ischemic nonhemorrhagic infarct involving the RIGHT thalamocapsular region.  Following this neurological event, patient does not have any significant residual deficits.  Myocardial perfusion imaging study was performed on 08/15/2021 revealing a mildly reduced eft ventricular systolic function with an EF of 40-45%.  There was apical inferior septal wall hypokinesis.  No artifact or left ventricular cavity size enlargement appreciated on review of imaging. SPECT images demonstrated a large apical  inferior septal persistent defect mostly fixed with some border zone redistribution. This area suggests scar consistent with the previous infarction  TID ratio = 1.07. This is a high risk study suggestive of scar and infarction with some border zone evidence of ischemia.     Coronary CTA was performed on 03/28/2023 that demonstrated an Agatston coronary artery calcium score of 4.77. This placed patient in the 23rd percentile for age, sex, and race matched controls. No significant calcium depositions noted within any of the patient's native coronary arteries.  Study demonstrates normal coronary origin with  RIGHT dominance.  Most recent TTE performed on 03/29/2023 revealed a mildly reduced  left ventricular systolic function with an EF of 45%.  Apical anterior, septal, anteroseptal, and inferoseptal walls all noted to be akinetic. Left ventricular diastolic Doppler parameters consistent with abnormal relaxation (G1DD). GLS -12.5%. Right ventricular size and function normal. There was trivial  tricuspid and pulmonary, in addition to mild mitral valve regurgitation. All transvalvular gradients were noted to be normal providing no evidence suggestive of valvular stenosis. Aorta normal in size  with no evidence of ectasia or aneurysmal dilatation.  Blood pressure mildly elevated at 140/80 mmHg on currently prescribed ARB (losartan) and beta-blocker (metoprolol tartrate therapies.  Patient is on atorvastatin for his HLD diagnosis and ASCVD prevention. In the setting of known cardiovascular diagnoses, it is important note that patient is on a PDE5i medication (sildenafil) for and erectile dysfunction diagnosis. Patient is not diabetic. He does not have an OSAH diagnosis. Patient is able to complete all of his  ADL/IADLs without cardiovascular limitation.  Per the DASI, patient is able to achieve at least 4 METS of physical activity without experiencing any significant degree of angina/anginal equivalent symptoms. No changes were made to his medication regimen during his visit with cardiology.  Patient scheduled to follow-up with outpatient cardiology in 1 year or sooner if needed.  Jimmy Montoya is scheduled for an elective REPAIR, HERNIA, INGUINAL, ROBOT-ASSISTED, LAPAROSCOPIC, USING MESH (Right: Inguinal) on 01/31/2024 with Dr. Sung Amabile, DO.  Given patient's past medical history significant for cardiovascular diagnoses, presurgical cardiac clearance was sought by the PAT team. Per cardiology, "this patient is optimized for surgery and may proceed with the planned procedural course with a LOW risk of significant perioperative cardiovascular complications".  In review of the patient's chart, it is noted that he is on daily oral antithrombotic therapy. He has been instructed on recommendations for holding his daily low-dose ASA for 5 days prior to his procedure with plans to restart as soon as postoperative bleeding risk felt to be minimized by his attending surgeon. The patient has been instructed that  his last dose of ASA should be on 01/25/2024.  Patient denies previous perioperative complications with anesthesia in the past. In review his EMR, it is noted that patient underwent a general anesthetic course here at [facility] ([facility]A II) in 11/2015 without documented complications.      01/23/2024   11:00 AM 03/28/2023   12:56 PM 03/28/2023   12:26 PM  Vitals with BMI  Height 5\' 7"     Weight 168 lbs    BMI 26.3    Systolic  118 144  Diastolic  86 97  Pulse  62 71   Providers/Specialists:  NOTE: Primary physician provider listed below. Patient may have been seen by APP or partner within same practice.   PROVIDER ROLE / SPECIALTY LAST Michelle Nasuti, DO General Surgery (Surgeon) 12/04/2023  Lynnea Ferrier, MD Primary Care Provider 11/06/2023  Rudean Hitt, MD Cardiology 04/23/2023   Allergies:  No Known Allergies Current Home Medications:   No current facility-administered medications for this encounter.    aspirin EC 81 MG tablet   atorvastatin (LIPITOR) 40 MG tablet   busPIRone (BUSPAR) 15 MG tablet   Cyanocobalamin (VITAMIN B-12 PO)   losartan (COZAAR) 25 MG tablet   metoprolol tartrate (LOPRESSOR) 25 MG tablet   ondansetron (ZOFRAN-ODT) 4 MG disintegrating tablet   sildenafil (VIAGRA) 100 MG tablet   spironolactone (ALDACTONE) 25 MG tablet   tamsulosin (FLOMAX) 0.4 MG CAPS capsule   traZODone (DESYREL) 50 MG tablet   History:   Past Medical History:  Diagnosis Date   Acute cerebrovascular accident (CVA) due to ischemia (HCC) 03/21/2021   a.) MRI brain 03/21/2021: 1 cm acute ischemic nonhemorrhagic infarct involving the RIGHT thalamocapsular region   Anemia 03/20/2021   Angina at rest [facility])    B12 deficiency    Bradycardia    CAD (coronary artery disease)    Cardiomyopathy (HCC)  ED (erectile dysfunction)    a.) on PDE5i (sildenafil)   Grade I hemorrhoids    Hematuria    Hernia, inguinal, right 09/02/2015   HFrEF  (heart failure with reduced ejection fraction) (HCC)    History of kidney stones    Hyperlipemia    Hypertension    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    Insomnia    a.) takes trazadone PRN   Leukopenia    Long-term use of aspirin therapy    Microscopic hematuria    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    Testosterone deficiency    Varicose vein of leg    Vitiligo    Past Surgical History:  Procedure Laterality Date   COLONOSCOPY N/A    INGUINAL HERNIA REPAIR Right 12/23/2015   Procedure: HERNIA REPAIR INGUINAL ADULT;  Surgeon: Nadeen Landau, MD;  Location: [facility] ORS;  Service: General;  Laterality: Right;   INGUINAL HERNIA REPAIR Left    ROTATOR CUFF REPAIR Left 2024   Family History  Problem Relation Age of Onset   Breast cancer Mother    CAD Brother    Social History   Tobacco Use   Smoking status: Never   Smokeless tobacco: Never  Substance Use Topics   Alcohol use: Yes    Comment: 1-2 per month   Pertinent Clinical Results:  LABS:  Hospital Outpatient Visit on 01/28/2024  Component Date Value Ref Range Status   WBC 01/28/2024 3.6 (L)  4.0 - 10.5 K/uL Final   RBC 01/28/2024 4.76  4.22 - 5.81 MIL/uL Final   Hemoglobin 01/28/2024 14.4  13.0 - 17.0 g/dL Final   HCT 91/47/8295 41.0  39.0 - 52.0 % Final   MCV 01/28/2024 86.1  80.0 - 100.0 fL Final   MCH 01/28/2024 30.3  26.0 - 34.0 pg Final   MCHC 01/28/2024 35.1  30.0 - 36.0 g/dL Final   RDW 62/13/0865 11.7  11.5 - 15.5 % Final   Platelets 01/28/2024 171  150 - 400 K/uL Final   nRBC 01/28/2024 0.0  0.0 - 0.2 % Final   Performed at [facility], [address] [place], [place] [address]   Sodium 01/28/2024 138  135 - 145 mmol/L Final   Potassium 01/28/2024 3.8  3.5 - 5.1 mmol/L Final   Chloride 01/28/2024 102  98 - 111 mmol/L Final   CO2 01/28/2024 28  22 - 32 mmol/L Final   Glucose, Bld 01/28/2024 104 (H)  70 - 99 mg/dL Final   Glucose reference range  applies only to samples taken after fasting for at least 8 hours.   BUN 01/28/2024 11  8 - 23 mg/dL Final   Creatinine, Ser 01/28/2024 0.81  0.61 - 1.24 mg/dL Final   Calcium 62/95/2841 9.3  8.9 - 10.3 mg/dL Final   GFR, Estimated 01/28/2024 >60  >60 mL/min Final   Comment: (NOTE) Calculated using the CKD-EPI Creatinine Equation (2021)    Anion gap 01/28/2024 8  5 - 15 Final   Performed at [facility], [address] [place], [place] [address]    ECG: Date: 01/28/2024  Time ECG obtained: 1117 AM Rate: 64 bpm Rhythm:  Normal sinus rhythm with sinus arrhythmia Axis (leads I and aVF): normal Intervals: PR 206 ms. QRS 90 ms. QTc 410 ms. ST segment and T wave changes: No evidence of acute T wave abnormalities or significant ST segment elevation or depression.  Evidence of a possible, age undetermined, prior  infarct:  Yes; septal Comparison: Similar to previous tracing obtained on 03/20/2021   IMAGING / PROCEDURES: TRANSTHORACIC ECHOCARDIOGRAM performed on 03/29/2023 Mildly reduced left ventricular systolic function with EF of 45% Apical anterior, septal, anteroseptal, and inferoseptal akinesis  Left ventricular diastolic Doppler parameters consistent with abnormal relaxation (G1DD). GLS -12.5 Normal right ventricular size and systolic function Trivial TR and PR Mild MR Normal gradients; no valvular stenosis No pericardial effusion  CT CORONARY MORPH W/CTA COR W/SCORE W/CA W/CM &/OR WO/CM performed on 03/28/2023 No significant extracardiac findings within the visualized chest.  Coronary calcium score of 4.77. This was 23rd percentile for age and sex matched control. Normal coronary origin with right dominance. No angiographic evidence of CAD. Consider non-atherosclerotic causes of chest pain.  MR SHOULDER LEFT WO CONTRAST performed on 02/06/2023 Sequela of prior anterior shoulder dislocation.  Small non-engaging Hill-Sachs and osseous Bankart injuries with  associated marrow edema.  No significant glenoid bone loss.  Nondisplaced anterior inferior labroligamentous injury and possible HAGL lesion involving the anterior band of the inferior glenohumeral ligament. Full-thickness, partial width tear at distal supraspinatus-infraspinatus overlap with up to 1.7 cm retraction, tear measuring 1.1 cm in width.  Moderate distal subscapularis tendinosis with articular sided fraying of the cephalad fibers.  No significant muscle atrophy. Mild to moderate glenohumeral and moderate AC joint osteoarthritis. Moderate subacromial-subdeltoid  fluid, related to the full-thickness cuff tear.  MYOCARDIAL PERFUSION IMAGING STUDY (LEXISCAN) performed on 08/15/2021 Normal left ventricular systolic function with a normal LVEF of 40-45% Normal myocardial thickening and wall motion Left ventricular cavity size normal SPECT images demonstrated abnormal myocardial perfusion scan evidence of large apical inferior septal persistent defect mostly fixed with some border zone  redistribution this area suggests scar consistent with the previous infarction  TID ratio = 1.07 This is a high risk study suggestive of scar and infarction with some border zone evidence of ischemia.    Impression and Plan:  Jimmy Montoya has been referred for pre-anesthesia review and clearance prior to him undergoing the planned anesthetic and procedural courses. Available labs, pertinent testing, and imaging results were personally reviewed by me in preparation for upcoming operative/procedural course. [facility] medical record has been updated following extensive record review and patient interview with PAT staff.   This patient has been appropriately cleared by cardiology with an overall LOW risk of experiencing significant perioperative cardiovascular complications. Based on clinical review performed today (01/28/24), barring any significant acute changes in the patient's overall condition, it is  anticipated that he will be able to proceed with the planned surgical intervention. Any acute changes in clinical condition may necessitate his procedure being postponed and/or cancelled. Patient will meet with anesthesia team (MD and/or CRNA) on the day of his procedure for preoperative evaluation/assessment. Questions regarding anesthetic course will be fielded at that time.   Pre-surgical instructions were reviewed with the patient during his PAT appointment, and questions were fielded to satisfaction by PAT clinical staff. He has been instructed on which medications that he will need to hold prior to surgery, as well as the ones that have been deemed safe/appropriate to take on the day of his procedure. As part of the general education provided by PAT, patient made aware both verbally and in writing, that he would need to abstain from the use of any illegal substances during his perioperative course. He was advised that failure to follow the provided instructions could necessitate case cancellation or result in serious perioperative complications up to and including death. Patient encouraged  to contact PAT and/or his surgeon's office to discuss any questions or concerns that may arise prior to surgery; verbalized understanding.   Quentin Mulling, MSN, APRN, FNP-C, CEN [facility]  Perioperative Services Nurse Practitioner Phone: [phone] Fax: [phone] 01/28/24 2:30 PM  NOTE: This note has been prepared using Dragon dictation software. Despite my best ability to proofread, there is always the potential that unintentional transcriptional errors may still occur from this process.

## 2024-01-28 ENCOUNTER — Encounter
Admission: RE | Admit: 2024-01-28 | Discharge: 2024-01-28 | Disposition: A | Discharge status: Home / Self Care | Source: Ambulatory Visit | Attending: Surgery | Admitting: Surgery

## 2024-01-28 DIAGNOSIS — Z01810 Encounter for preprocedural cardiovascular examination: Secondary | ICD-10-CM

## 2024-01-28 DIAGNOSIS — I639 Cerebral infarction, unspecified: Secondary | ICD-10-CM | POA: Diagnosis not present

## 2024-01-28 DIAGNOSIS — Z01818 Encounter for other preprocedural examination: Secondary | ICD-10-CM | POA: Insufficient documentation

## 2024-01-28 DIAGNOSIS — D649 Anemia, unspecified: Secondary | ICD-10-CM | POA: Insufficient documentation

## 2024-01-28 DIAGNOSIS — Z01812 Encounter for preprocedural laboratory examination: Secondary | ICD-10-CM

## 2024-01-28 DIAGNOSIS — I10 Essential (primary) hypertension: Secondary | ICD-10-CM | POA: Diagnosis not present

## 2024-01-28 LAB — BASIC METABOLIC PANEL WITH GFR
Anion gap: 8 (ref 5–15)
BUN: 11 mg/dL (ref 8–23)
CO2: 28 mmol/L (ref 22–32)
Calcium: 9.3 mg/dL (ref 8.9–10.3)
Chloride: 102 mmol/L (ref 98–111)
Creatinine, Ser: 0.81 mg/dL (ref 0.61–1.24)
GFR, Estimated: >60 mL/min (ref >60)
Glucose, Bld: 104 mg/dL — ABNORMAL HIGH (ref 70–99)
Potassium: 3.8 mmol/L (ref 3.5–5.1)
Sodium: 138 mmol/L (ref 135–145)

## 2024-01-28 LAB — CBC
HCT: 41 % (ref 39.0–52.0)
Hemoglobin: 14.4 g/dL (ref 13.0–17.0)
MCH: 30.3 pg (ref 26.0–34.0)
MCHC: 35.1 g/dL (ref 30.0–36.0)
MCV: 86.1 fL (ref 80.0–100.0)
Platelets: 171 10*3/uL (ref 150–400)
RBC: 4.76 MIL/uL (ref 4.22–5.81)
RDW: 11.7 % (ref 11.5–15.5)
WBC: 3.6 10*3/uL — ABNORMAL LOW (ref 4.0–10.5)
nRBC: 0 % (ref 0.0–0.2)

## 2024-01-31 ENCOUNTER — Encounter: Payer: Self-pay | Admitting: Surgery

## 2024-01-31 ENCOUNTER — Ambulatory Visit: Payer: Self-pay | Admitting: Urgent Care

## 2024-01-31 ENCOUNTER — Encounter
Admission: RE | Disposition: A | Discharge status: Home / Self Care | Payer: Self-pay | Source: Home / Self Care | Attending: Surgery

## 2024-01-31 ENCOUNTER — Other Ambulatory Visit: Payer: Self-pay

## 2024-01-31 ENCOUNTER — Ambulatory Visit
Admission: RE | Admit: 2024-01-31 | Discharge: 2024-01-31 | Disposition: A | Discharge status: Home / Self Care | Payer: Medicare Other | Attending: Surgery | Admitting: Surgery

## 2024-01-31 DIAGNOSIS — I340 Nonrheumatic mitral (valve) insufficiency: Secondary | ICD-10-CM | POA: Diagnosis not present

## 2024-01-31 DIAGNOSIS — I2510 Atherosclerotic heart disease of native coronary artery without angina pectoris: Secondary | ICD-10-CM | POA: Insufficient documentation

## 2024-01-31 DIAGNOSIS — I10 Essential (primary) hypertension: Secondary | ICD-10-CM | POA: Insufficient documentation

## 2024-01-31 DIAGNOSIS — K4091 Unilateral inguinal hernia, without obstruction or gangrene, recurrent: Secondary | ICD-10-CM | POA: Diagnosis present

## 2024-01-31 DIAGNOSIS — Z8673 Personal history of transient ischemic attack (TIA), and cerebral infarction without residual deficits: Secondary | ICD-10-CM | POA: Diagnosis not present

## 2024-01-31 DIAGNOSIS — Z87442 Personal history of urinary calculi: Secondary | ICD-10-CM | POA: Insufficient documentation

## 2024-01-31 DIAGNOSIS — K4090 Unilateral inguinal hernia, without obstruction or gangrene, not specified as recurrent: Secondary | ICD-10-CM

## 2024-01-31 DIAGNOSIS — E785 Hyperlipidemia, unspecified: Secondary | ICD-10-CM | POA: Diagnosis not present

## 2024-01-31 DIAGNOSIS — R001 Bradycardia, unspecified: Secondary | ICD-10-CM | POA: Diagnosis not present

## 2024-01-31 DIAGNOSIS — I5022 Chronic systolic (congestive) heart failure: Secondary | ICD-10-CM | POA: Diagnosis not present

## 2024-01-31 DIAGNOSIS — I110 Hypertensive heart disease with heart failure: Secondary | ICD-10-CM | POA: Insufficient documentation

## 2024-01-31 HISTORY — DX: Asymptomatic varicose veins of unspecified lower extremity: I83.90

## 2024-01-31 HISTORY — DX: Insomnia, unspecified: G47.00

## 2024-01-31 HISTORY — DX: Unspecified systolic (congestive) heart failure: I50.20

## 2024-01-31 HISTORY — DX: Atherosclerotic heart disease of native coronary artery without angina pectoris: I25.10

## 2024-01-31 HISTORY — PX: XI ROBOTIC ASSISTED INGUINAL HERNIA REPAIR WITH MESH: SHX6706

## 2024-01-31 HISTORY — DX: Long term (current) use of aspirin: Z79.82

## 2024-01-31 SURGERY — REPAIR, HERNIA, INGUINAL, ROBOT-ASSISTED, LAPAROSCOPIC, USING MESH
Anesthesia: General | Site: Inguinal | Laterality: Right

## 2024-01-31 MED ORDER — LACTATED RINGERS IV SOLN: INTRAVENOUS | Status: DC

## 2024-01-31 MED ORDER — CHLORHEXIDINE GLUCONATE 0.12 % MT SOLN
OROMUCOSAL | Status: AC
  Filled 2024-01-31: qty 15

## 2024-01-31 MED ORDER — BUPIVACAINE-EPINEPHRINE (PF) 0.5% -1:200000 IJ SOLN
INTRAMUSCULAR | Status: AC
  Filled 2024-01-31: qty 30

## 2024-01-31 MED ORDER — CELECOXIB 200 MG PO CAPS
200.0000 mg | ORAL_CAPSULE | ORAL | Status: AC
  Administered 2024-01-31: 200 mg via ORAL

## 2024-01-31 MED ORDER — DEXAMETHASONE SODIUM PHOSPHATE 10 MG/ML IJ SOLN
INTRAMUSCULAR | Status: DC | PRN
Start: 2024-01-31 — End: 2024-01-31
  Administered 2024-01-31: 10 mg via INTRAVENOUS

## 2024-01-31 MED ORDER — PROPOFOL 10 MG/ML IV BOLUS
INTRAVENOUS | Status: AC
  Filled 2024-01-31: qty 20

## 2024-01-31 MED ORDER — ACETAMINOPHEN 325 MG PO TABS: 650.0000 mg | ORAL_TABLET | Freq: Three times a day (TID) | ORAL | 0 refills | Status: AC | PRN

## 2024-01-31 MED ORDER — BUPIVACAINE LIPOSOME 1.3 % IJ SUSP
INTRAMUSCULAR | Status: AC
  Filled 2024-01-31: qty 20

## 2024-01-31 MED ORDER — 0.9 % SODIUM CHLORIDE (POUR BTL) OPTIME
TOPICAL | Status: DC | PRN
  Administered 2024-01-31: 500 mL

## 2024-01-31 MED ORDER — HYDRALAZINE HCL 20 MG/ML IJ SOLN
INTRAMUSCULAR | Status: DC | PRN
  Administered 2024-01-31: 5 mg via INTRAVENOUS

## 2024-01-31 MED ORDER — FENTANYL CITRATE (PF) 100 MCG/2ML IJ SOLN
INTRAMUSCULAR | Status: AC
  Filled 2024-01-31: qty 2

## 2024-01-31 MED ORDER — EPHEDRINE SULFATE-NACL 50-0.9 MG/10ML-% IV SOSY
PREFILLED_SYRINGE | INTRAVENOUS | Status: DC | PRN
  Administered 2024-01-31: 10 mg via INTRAVENOUS

## 2024-01-31 MED ORDER — FENTANYL CITRATE (PF) 100 MCG/2ML IJ SOLN
INTRAMUSCULAR | Status: DC | PRN
Start: 2024-01-31 — End: 2024-01-31
  Administered 2024-01-31: 50 ug via INTRAVENOUS

## 2024-01-31 MED ORDER — CEFAZOLIN SODIUM-DEXTROSE 2-4 GM/100ML-% IV SOLN
INTRAVENOUS | Status: AC
  Filled 2024-01-31: qty 100

## 2024-01-31 MED ORDER — OXYCODONE HCL 5 MG PO TABS
ORAL_TABLET | ORAL | Status: AC
  Filled 2024-01-31: qty 1

## 2024-01-31 MED ORDER — CELECOXIB 200 MG PO CAPS
ORAL_CAPSULE | ORAL | Status: AC
  Filled 2024-01-31: qty 1

## 2024-01-31 MED ORDER — FENTANYL CITRATE (PF) 100 MCG/2ML IJ SOLN
25.0000 ug | INTRAMUSCULAR | Status: DC | PRN
Start: 2024-01-31 — End: 2024-01-31
  Administered 2024-01-31 (×2): 25 ug via INTRAVENOUS

## 2024-01-31 MED ORDER — IBUPROFEN 800 MG PO TABS
800.0000 mg | ORAL_TABLET | Freq: Three times a day (TID) | ORAL | 0 refills | Status: AC | PRN
Start: 2024-01-31 — End: ?

## 2024-01-31 MED ORDER — GABAPENTIN 300 MG PO CAPS
ORAL_CAPSULE | ORAL | Status: AC
  Filled 2024-01-31: qty 1

## 2024-01-31 MED ORDER — CHLORHEXIDINE GLUCONATE 0.12 % MT SOLN
15.0000 mL | Freq: Once | OROMUCOSAL | Status: AC
  Administered 2024-01-31: 15 mL via OROMUCOSAL

## 2024-01-31 MED ORDER — CHLORHEXIDINE GLUCONATE CLOTH 2 % EX PADS
6.0000 | MEDICATED_PAD | Freq: Once | CUTANEOUS | Status: DC
Start: 2024-01-31 — End: 2024-01-31

## 2024-01-31 MED ORDER — ORAL CARE MOUTH RINSE: 15.0000 mL | Freq: Once | OROMUCOSAL | Status: AC

## 2024-01-31 MED ORDER — OXYCODONE HCL 5 MG PO TABS
5.0000 mg | ORAL_TABLET | Freq: Once | ORAL | Status: AC | PRN
  Administered 2024-01-31: 5 mg via ORAL

## 2024-01-31 MED ORDER — PROPOFOL 1000 MG/100ML IV EMUL
INTRAVENOUS | Status: AC
  Filled 2024-01-31: qty 100

## 2024-01-31 MED ORDER — ONDANSETRON HCL 4 MG/2ML IJ SOLN
INTRAMUSCULAR | Status: DC | PRN
  Administered 2024-01-31: 4 mg via INTRAVENOUS

## 2024-01-31 MED ORDER — BUPIVACAINE LIPOSOME 1.3 % IJ SUSP
INTRAMUSCULAR | Status: DC | PRN
  Administered 2024-01-31: 20 mL

## 2024-01-31 MED ORDER — LIDOCAINE HCL (CARDIAC) PF 100 MG/5ML IV SOSY
PREFILLED_SYRINGE | INTRAVENOUS | Status: DC | PRN
  Administered 2024-01-31: 80 mg via INTRAVENOUS
  Administered 2024-01-31: 100 mg via INTRAVENOUS

## 2024-01-31 MED ORDER — GABAPENTIN 300 MG PO CAPS
300.0000 mg | ORAL_CAPSULE | ORAL | Status: AC
  Administered 2024-01-31: 300 mg via ORAL

## 2024-01-31 MED ORDER — MIDAZOLAM HCL 2 MG/2ML IJ SOLN
INTRAMUSCULAR | Status: DC | PRN
  Administered 2024-01-31: 2 mg via INTRAVENOUS

## 2024-01-31 MED ORDER — ONDANSETRON HCL 4 MG/2ML IJ SOLN: 4.0000 mg | Freq: Once | INTRAMUSCULAR | Status: DC | PRN

## 2024-01-31 MED ORDER — ACETAMINOPHEN 500 MG PO TABS
1000.0000 mg | ORAL_TABLET | ORAL | Status: AC
  Administered 2024-01-31: 1000 mg via ORAL

## 2024-01-31 MED ORDER — ROCURONIUM BROMIDE 100 MG/10ML IV SOLN
INTRAVENOUS | Status: DC | PRN
  Administered 2024-01-31: 50 mg via INTRAVENOUS
  Administered 2024-01-31: 20 mg via INTRAVENOUS

## 2024-01-31 MED ORDER — SUGAMMADEX SODIUM 200 MG/2ML IV SOLN
INTRAVENOUS | Status: DC | PRN
  Administered 2024-01-31: 200 mg via INTRAVENOUS

## 2024-01-31 MED ORDER — BUPIVACAINE-EPINEPHRINE 0.5% -1:200000 IJ SOLN
INTRAMUSCULAR | Status: DC | PRN
  Administered 2024-01-31: 15 mL

## 2024-01-31 MED ORDER — CEFAZOLIN SODIUM-DEXTROSE 2-4 GM/100ML-% IV SOLN
2.0000 g | INTRAVENOUS | Status: AC
Start: 2024-01-31 — End: 2024-01-31
  Administered 2024-01-31: 2 g via INTRAVENOUS

## 2024-01-31 MED ORDER — OXYCODONE-ACETAMINOPHEN 5-325 MG PO TABS: 1.0000 | ORAL_TABLET | Freq: Three times a day (TID) | ORAL | 0 refills | Status: AC | PRN

## 2024-01-31 MED ORDER — PROPOFOL 10 MG/ML IV BOLUS
INTRAVENOUS | Status: DC | PRN
  Administered 2024-01-31: 50 mg via INTRAVENOUS
  Administered 2024-01-31: 150 mg via INTRAVENOUS

## 2024-01-31 MED ORDER — DOCUSATE SODIUM 100 MG PO CAPS: 100.0000 mg | ORAL_CAPSULE | Freq: Two times a day (BID) | ORAL | 0 refills | Status: AC | PRN

## 2024-01-31 MED ORDER — OXYCODONE HCL 5 MG/5ML PO SOLN: 5.0000 mg | Freq: Once | ORAL | Status: AC | PRN

## 2024-01-31 MED ORDER — ACETAMINOPHEN 500 MG PO TABS
ORAL_TABLET | ORAL | Status: AC
  Filled 2024-01-31: qty 2

## 2024-01-31 MED ORDER — MIDAZOLAM HCL 2 MG/2ML IJ SOLN
INTRAMUSCULAR | Status: AC
  Filled 2024-01-31: qty 2

## 2024-01-31 SURGICAL SUPPLY — 43 items
BAG PRESSURE INF REUSE 1000 (BAG) IMPLANT
BNDG GAUZE DERMACEA FLUFF 4 (GAUZE/BANDAGES/DRESSINGS) ×1 IMPLANT
COVER TIP SHEARS 8 DVNC (MISCELLANEOUS) ×1 IMPLANT
COVER WAND RF STERILE (DRAPES) ×1 IMPLANT
DERMABOND ADVANCED .7 DNX12 (GAUZE/BANDAGES/DRESSINGS) ×1 IMPLANT
DRAPE ARM DVNC X/XI (DISPOSABLE) ×3 IMPLANT
DRAPE COLUMN DVNC XI (DISPOSABLE) ×1 IMPLANT
ELECT REM PT RETURN 9FT ADLT (ELECTROSURGICAL) ×1 IMPLANT
ELECTRODE REM PT RTRN 9FT ADLT (ELECTROSURGICAL) ×1 IMPLANT
FORCEPS BPLR FENES DVNC XI (FORCEP) ×1 IMPLANT
FORCEPS BPLR R/ABLATION 8 DVNC (INSTRUMENTS) IMPLANT
GLOVE BIOGEL PI IND STRL 7.0 (GLOVE) ×2 IMPLANT
GLOVE SURG SYN 6.5 ES PF (GLOVE) ×2 IMPLANT
GLOVE SURG SYN 6.5 PF PI (GLOVE) ×2 IMPLANT
GOWN STRL REUS W/ TWL LRG LVL3 (GOWN DISPOSABLE) ×3 IMPLANT
IRRIGATOR SUCT 8 DISP DVNC XI (IRRIGATION / IRRIGATOR) IMPLANT
IV NS 1000ML BAXH (IV SOLUTION) IMPLANT
LABEL OR SOLS (LABEL) IMPLANT
MANIFOLD NEPTUNE II (INSTRUMENTS) ×1 IMPLANT
MESH 3DMAX MID 4X6 RT LRG (Mesh General) IMPLANT
NDL DRIVE SUT CUT DVNC (INSTRUMENTS) ×1 IMPLANT
NDL HYPO 22X1.5 SAFETY MO (MISCELLANEOUS) ×1 IMPLANT
NDL INSUFFLATION 14GA 120MM (NEEDLE) ×1 IMPLANT
NEEDLE DRIVE SUT CUT DVNC (INSTRUMENTS) ×1 IMPLANT
NEEDLE HYPO 22X1.5 SAFETY MO (MISCELLANEOUS) ×1 IMPLANT
NEEDLE INSUFFLATION 14GA 120MM (NEEDLE) ×1 IMPLANT
NS IRRIG 500ML POUR BTL (IV SOLUTION) IMPLANT
OBTURATOR OPTICAL STND 8 DVNC (TROCAR) ×1 IMPLANT
OBTURATOR OPTICALSTD 8 DVNC (TROCAR) ×1 IMPLANT
PACK LAP CHOLECYSTECTOMY (MISCELLANEOUS) ×1 IMPLANT
SCISSORS MNPLR CVD DVNC XI (INSTRUMENTS) ×1 IMPLANT
SEAL UNIV 5-12 XI (MISCELLANEOUS) ×3 IMPLANT
SET TUBE SMOKE EVAC HIGH FLOW (TUBING) ×1 IMPLANT
SOL ELECTROSURG ANTI STICK (MISCELLANEOUS) ×1 IMPLANT
SOLUTION ELECTROSURG ANTI STCK (MISCELLANEOUS) ×1 IMPLANT
SUT MNCRL 4-0 27 PS-2 XMFL (SUTURE) ×2 IMPLANT
SUT STRATA 3-0 23 RB-1.5 (SUTURE) ×1 IMPLANT
SUT VIC AB 2-0 SH 27XBRD (SUTURE) ×1 IMPLANT
SUTURE MNCRL 4-0 27XMF (SUTURE) ×1 IMPLANT
SYR 30ML LL (SYRINGE) ×1 IMPLANT
TAPE TRANSPORE STRL 2 31045 (GAUZE/BANDAGES/DRESSINGS) ×1 IMPLANT
TRAP FLUID SMOKE EVACUATOR (MISCELLANEOUS) ×1 IMPLANT
WATER STERILE IRR 500ML POUR (IV SOLUTION) ×1 IMPLANT

## 2024-01-31 NOTE — Interval H&P Note (Signed)
 History and Physical Interval Note:  01/31/2024 6:55 AM  Melony Overly  has presented today for surgery, with the diagnosis of K40.91 unilateral recurrentinguinal hernia w/o obstruction or gangrene.  The various methods of treatment have been discussed with the patient and family. After consideration of risks, benefits and other options for treatment, the patient has consented to  Procedure(s): REPAIR, HERNIA, INGUINAL, ROBOT-ASSISTED, LAPAROSCOPIC, USING MESH (Right) as a surgical intervention.  The patient's history has been reviewed, patient examined, no change in status, stable for surgery.  I have reviewed the patient's chart and labs.  Questions were answered to the patient's satisfaction.     Dondre Catalfamo Tonna Boehringer

## 2024-01-31 NOTE — Anesthesia Postprocedure Evaluation (Signed)
 Anesthesia Post Note  Patient: Jimmy Montoya  Procedure(s) Performed: REPAIR, HERNIA, INGUINAL, ROBOT-ASSISTED, LAPAROSCOPIC, USING MESH (Right: Inguinal)  Patient location during evaluation: PACU Anesthesia Type: General Level of consciousness: awake and alert Pain management: pain level controlled Vital Signs Assessment: post-procedure vital signs reviewed and stable Respiratory status: spontaneous breathing, nonlabored ventilation, respiratory function stable and patient connected to nasal cannula oxygen Cardiovascular status: blood pressure returned to baseline and stable Postop Assessment: no apparent nausea or vomiting Anesthetic complications: no   There were no known notable events for this encounter.   Last Vitals:  Vitals:   01/31/24 1015 01/31/24 1116  BP: 107/70 110/75  Pulse: 63 60  Resp: 11 12  Temp:  (!) 36.1 C  SpO2: 98% 98%    Last Pain:  Vitals:   01/31/24 1116  TempSrc: Oral  PainSc: 2                  Corinda Gubler

## 2024-01-31 NOTE — Anesthesia Procedure Notes (Signed)
 Procedure Name: Intubation Date/Time: 01/31/2024 8:01 AM  Performed by: Maryla Morrow., CRNAPre-anesthesia Checklist: Patient identified, Patient being monitored, Timeout performed, Emergency Drugs available and Suction available Patient Re-evaluated:Patient Re-evaluated prior to induction Oxygen Delivery Method: Circle system utilized Preoxygenation: Pre-oxygenation with 100% oxygen Induction Type: IV induction Ventilation: Mask ventilation without difficulty Laryngoscope Size: McGrath and 4 Grade View: Grade I Tube type: Oral Tube size: 7.5 mm Number of attempts: 1 Airway Equipment and Method: Stylet Placement Confirmation: ETT inserted through vocal cords under direct vision, positive ETCO2 and breath sounds checked- equal and bilateral Secured at: 23 cm Tube secured with: Tape Dental Injury: Teeth and Oropharynx as per pre-operative assessment

## 2024-01-31 NOTE — Anesthesia Preprocedure Evaluation (Addendum)
 Anesthesia Evaluation  Patient identified by MRN, date of birth, ID band Patient awake    Reviewed: Allergy & Precautions, NPO status , Patient's Chart, lab work & pertinent test results  History of Anesthesia Complications Negative for: history of anesthetic complications  Airway Mallampati: II  TM Distance: >3 FB Neck ROM: Full    Dental  (+) Edentulous Upper, Chipped   Pulmonary neg pulmonary ROS, neg sleep apnea, neg COPD, Patient abstained from smoking.Not current smoker   Pulmonary exam normal breath sounds clear to auscultation       Cardiovascular Exercise Tolerance: Good METShypertension, + CAD  (-) Past MI (-) dysrhythmias  Rhythm:Regular Rate:Bradycardia - Systolic murmurs Most recent TTE performed on 03/29/2023 revealed a mildly reduced  left ventricular systolic function with an EF of 45%.  Apical anterior, septal, anteroseptal, and inferoseptal walls all noted to be akinetic. Left ventricular diastolic Doppler parameters consistent with abnormal relaxation (G1DD). GLS -12.5%. Right ventricular size and function normal. There was trivial tricuspid and pulmonary, in addition to mild mitral valve regurgitation. All transvalvular gradients were noted to be normal providing no evidence suggestive of valvular stenosis. Aorta normal in size with no evidence of ectasia or aneurysmal dilatation.   Neuro/Psych CVA, No Residual Symptoms  negative psych ROS   GI/Hepatic ,neg GERD  ,,(+)     (-) substance abuse    Endo/Other  neg diabetes    Renal/GU negative Renal ROS     Musculoskeletal   Abdominal   Peds  Hematology   Anesthesia Other Findings Past Medical History: 03/21/2021: Acute cerebrovascular accident (CVA) due to ischemia [facility])     Comment:  a.) MRI brain 03/21/2021: 1 cm acute ischemic               nonhemorrhagic infarct involving the RIGHT               thalamocapsular region 03/20/2021: Anemia No  date: Angina at rest [facility]) No date: B12 deficiency No date: Bradycardia No date: CAD (coronary artery disease) No date: Cardiomyopathy (HCC) No date: ED (erectile dysfunction)     Comment:  a.) on PDE5i (sildenafil) No date: Grade I hemorrhoids No date: Hematuria 09/02/2015: Hernia, inguinal, right No date: HFrEF (heart failure with reduced ejection fraction) (HCC) No date: History of kidney stones No date: Hyperlipemia No date: Hypertension No date: 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 No date: Insomnia     Comment:  a.) takes trazadone PRN No date: Leukopenia No date: Long-term use of aspirin therapy No date: Microscopic hematuria No date: 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 No date: Testosterone deficiency No date: Varicose vein of leg No date: Vitiligo  Reproductive/Obstetrics                             Anesthesia Physical Anesthesia Plan  ASA: 3  Anesthesia Plan: General   Post-op Pain Management: Tylenol PO (pre-op)*, Gabapentin PO (pre-op)* and Celebrex PO (pre-op)*   Induction: Intravenous  PONV Risk Score and Plan: 3 and Ondansetron, Dexamethasone, Treatment may vary due to age or medical condition and Midazolam  Airway Management Planned: Oral ETT and Video Laryngoscope Planned  Additional Equipment: None  Intra-op Plan:   Post-operative Plan: Extubation in OR  Informed Consent: I have reviewed the patients History and Physical, chart, labs and discussed the procedure including the risks, benefits and alternatives for the proposed anesthesia with the patient or authorized representative who  has indicated his/her understanding and acceptance.     Dental advisory given  Plan Discussed with: CRNA and Surgeon  Anesthesia Plan Comments: (Discussed risks of anesthesia with patient, including PONV, sore throat, lip/dental/eye damage. Rare risks discussed as well, such as cardiorespiratory and  neurological sequelae, and allergic reactions. Discussed the role of CRNA in patient's perioperative care. Patient understands.)       Anesthesia Quick Evaluation

## 2024-01-31 NOTE — Transfer of Care (Signed)
 Immediate Anesthesia Transfer of Care Note  Patient: Jimmy Montoya  Procedure(s) Performed: REPAIR, HERNIA, INGUINAL, ROBOT-ASSISTED, LAPAROSCOPIC, USING MESH (Right: Inguinal)  Patient Location: PACU  Anesthesia Type:General  Level of Consciousness: drowsy  Airway & Oxygen Therapy: Patient Spontanous Breathing and Patient connected to face mask oxygen  Post-op Assessment: Report given to RN and Post -op Vital signs reviewed and stable  Post vital signs: stable  Last Vitals:  Vitals Value Taken Time  BP 110/68 01/31/24 0921  Temp 36.6 C 01/31/24 0921  Pulse 61 01/31/24 0923  Resp 10 01/31/24 0923  SpO2 100 % 01/31/24 0923  Vitals shown include unfiled device data.  Last Pain:  Vitals:   01/31/24 0921  PainSc: Asleep         Complications: No notable events documented.

## 2024-01-31 NOTE — H&P (Signed)
 Subjective:   CC: Unilateral recurrent inguinal hernia without obstruction or gangrene [K40.91]  HPI: Jimmy Montoya is a 68 y.o. male who was referred by Jamey Reas, MD for evaluation of above. Previous hx of repair of both sides separately, right side never felt fully healed, now asking for possible repair of right again.  Past Medical History: has a past medical history of Anemia, Hernia, inguinal, right (09/02/2015), Hyperlipidemia, Leukopenia, Microscopic hematuria, Renal stones, Systolic congestive heart failure (CMS/HHS-HCC) (04/13/2021), Testosterone deficiency, and Vitiligo.  Past Surgical History:  Past Surgical History:  Procedure Laterality Date  COLONOSCOPY N/A 11/09/2010  Dr. Eber Hong @ [facility] - Int. Hemorrhoids, rpt 10 yrs per [facility]  HERNIA REPAIR Left 10/2013  left inguinal hernia; Dr. Renda Rolls   Family History: family history includes Breast cancer in his mother; Coronary Artery Disease (Blocked arteries around heart) in his brother.  Social History: reports that he has never smoked. He has never used smokeless tobacco. He reports that he does not currently use alcohol. He reports that he does not use drugs.  Current Medications: has a current medication list which includes the following prescription(s): aspirin, atorvastatin, buspirone, cyanocobalamin, losartan, metoprolol tartrate, naloxone, ondansetron, oxycodone, sildenafil, spironolactone, tamsulosin, and trazodone.  Allergies:  Allergies as of 12/04/2023  (No Known Allergies)   ROS:  A 15 point review of systems was performed and pertinent positives and negatives noted in HPI  Objective:    BP 128/75  Pulse 61  Ht 170.2 cm (5\' 7" )  Wt 76.2 kg (168 lb)  BMI 26.31 kg/m   Constitutional : Alert, cooperative, no distress  Lymphatics/Throat: Supple, no lymphadenopathy  Respiratory: clear to auscultation bilaterally  Cardiovascular: regular rate and rhythm  Gastrointestinal: soft, non-tender; bowel  sounds normal; no masses, no organomegaly. inguinal hernia noted. small, reducible, no overlying skin changes, and RIGHT  Musculoskeletal: Steady gait and movement  Skin: Cool and moist, open inguinal hernia surgical scars  Psychiatric: Normal affect, non-agitated, not confused    LABS:  N/a   RADS: N/a Assessment:    Unilateral recurrent inguinal hernia without obstruction or gangrene [K40.91]  Plan:    1. Unilateral recurrent inguinal hernia without obstruction or gangrene [K40.91]  Discussed the risk of surgery including recurrence, which can be up to 50% in the case of incisional or complex hernias, possible use of prosthetic materials (mesh) and the increased risk of mesh infxn if used, bleeding, chronic pain, post-op infxn, post-op SBO or ileus, and possible re-operation to address said risks. The risks of general anesthetic, if used, includes MI, CVA, sudden death or even reaction to anesthetic medications also discussed. Alternatives include continued observation. Benefits include possible symptom relief, prevention of incarceration, strangulation, enlargement in size over time, and the risk of emergency surgery in the face of strangulation.   Typical post-op recovery time of 3-5 days with 2 weeks of activity restrictions were also discussed.  ED return precautions given for sudden increase in pain, size of hernia with accompanying fever, nausea, and/or vomiting.  The patient verbalized understanding and all questions were answered to the patient's satisfaction.  2. Patient has elected to proceed with surgical treatment. Procedure will be scheduled. RIGHT robotic assisted laparoscopic. Hold aspirin 5 days  labs/images/medications/previous chart entries reviewed personally and relevant changes/updates noted above.

## 2024-01-31 NOTE — Discharge Instructions (Signed)
 Hernia repair, Care After ?This sheet gives you information about how to care for yourself after your procedure. Your health care provider may also give you more specific instructions. If you have problems or questions, contact your health care provider. ?What can I expect after the procedure? ?After your procedure, it is common to have the following: ?Pain in your abdomen, especially in the incision areas. You will be given medicine to control the pain. ?Tiredness. This is a normal part of the recovery process. Your energy level will return to normal over the next several weeks. ?Changes in your bowel movements, such as constipation or needing to go more often. Talk with your health care provider about how to manage this. ?Follow these instructions at home: ?Medicines ? tylenol and advil as needed for discomfort.  Please alternate between the two every four hours as needed for pain.   ? Use narcotics, if prescribed, only when tylenol and motrin is not enough to control pain. ? 325-650mg  every 8hrs to max of 3000mg /24hrs (including the 325mg  in every norco dose) for the tylenol.   ? Advil up to 800mg  per dose every 8hrs as needed for pain.   ?PLEASE RECORD NUMBER OF PILLS TAKEN UNTIL NEXT FOLLOW UP APPT.  THIS WILL HELP DETERMINE HOW READY YOU ARE TO BE RELEASED FROM ANY ACTIVITY RESTRICTIONS ?Do not drive or use heavy machinery while taking prescription pain medicine. ?Do not drink alcohol while taking prescription pain medicine. ? ?Incision care ? ?  ?Follow instructions from your health care provider about how to take care of your incision areas. Make sure you: ?Keep your incisions clean and dry. ?Wash your hands with soap and water before and after applying medicine to the areas, and before and after changing your bandage (dressing). If soap and water are not available, use hand sanitizer. ?Change your dressing as told by your health care provider. ?Leave stitches (sutures), skin glue, or adhesive strips in  place. These skin closures may need to stay in place for 2 weeks or longer. If adhesive strip edges start to loosen and curl up, you may trim the loose edges. Do not remove adhesive strips completely unless your health care provider tells you to do that. ?Do not wear tight clothing over the incisions. Tight clothing may rub and irritate the incision areas, which may cause the incisions to open. ?Do not take baths, swim, or use a hot tub until your health care provider approves. OK TO SHOWER IN 24HRS.   ?Check your incision area every day for signs of infection. Check for: ?More redness, swelling, or pain. ?More fluid or blood. ?Warmth. ?Pus or a bad smell. ?Activity ?Avoid lifting anything that is heavier than 10 lb (4.5 kg) for 2 weeks or until your health care provider says it is okay. ?No pushing/pulling greater than 30lbs ?You may resume normal activities as told by your health care provider. Ask your health care provider what activities are safe for you. ?Take rest breaks during the day as needed. ?Eating and drinking ?Follow instructions from your health care provider about what you can eat after surgery. ?To prevent or treat constipation while you are taking prescription pain medicine, your health care provider may recommend that you: ?Drink enough fluid to keep your urine clear or pale yellow. ?Take over-the-counter or prescription medicines. ?Eat foods that are high in fiber, such as fresh fruits and vegetables, whole grains, and beans. ?Limit foods that are high in fat and processed sugars, such as fried and  sweet foods. ?General instructions ?Ask your health care provider when you will need an appointment to get your sutures or staples removed. ?Keep all follow-up visits as told by your health care provider. This is important. ?Contact a health care provider if: ?You have more redness, swelling, or pain around your incisions. ?You have more fluid or blood coming from the incisions. ?Your incisions feel  warm to the touch. ?You have pus or a bad smell coming from your incisions or your dressing. ?You have a fever. ?You have an incision that breaks open (edges not staying together) after sutures or staples have been removed. ?You develop a rash. ?You have chest pain or difficulty breathing. ?You have pain or swelling in your legs. ?You feel light-headed or you faint. ?Your abdomen swells (becomes distended). ?You have nausea or vomiting. ?You have blood in your stool (feces). ?This information is not intended to replace advice given to you by your health care provider. Make sure you discuss any questions you have with your health care provider. ?Document Released: 05/04/2005 Document Revised: 07/04/2018 Document Reviewed: 07/16/2016 ?Elsevier Interactive Patient Education ? 2019 [employer] ?  ? ?

## 2024-01-31 NOTE — Op Note (Signed)
 Preoperative diagnosis: Right, recurrent, reducible inguinal Hernia.  Postoperative diagnosis: Right recurrent reducible inguinal Hernia  Procedure: Robotic assisted laparoscopic right inguinal hernia repair with mesh  Anesthesia: General  Surgeon: Dr. Tonna Boehringer  Wound Classification: Clean  Specimen: none  Complications: None  Estimated Blood Loss: 10mL   Indications:  inguinal hernia. Repair was indicated to avoid complications of incarceration, obstruction and pain, and a prosthetic mesh repair was elected.  See H&P for further details.  Findings: 1. Vas Deferens and cord structures identified and preserved 2. Bard 3D max medium weight mesh used for repair 3. Adequate hemostasis achieved  Description of procedure: The patient was taken to the operating room. A time-out was completed verifying correct patient, procedure, site, positioning, and implant(s) and/or special equipment prior to beginning this procedure.  Area was prepped and draped in the usual sterile fashion. An incision was marked 20 cm above the pubic tubercle, slightly above the umbilicus  Scrotum wrapped in Kerlix roll.  Veress needle inserted at palmer's point.  Saline drop test noted to be positive with gradual increase in pressure after initiation of gas insufflation.  15 mm of pressure was achieved prior to removing the Veress needle and then placing a 8 mm port via the Optiview technique through the supraumbilical site.  Inspection of the area afterwards noted no injury to the surrounding organs during insertion of the needle and the port.  2 port sites were marked 8 cm to the lateral sides of the initial port, and a 8 mm robotic port was placed on the left side, another 8 mm robotic port on the right side under direct supervision.  Local anesthesia  infused to the preplanned incision sites prior to insertion of the port.  The [employer] platform was then brought into the operative field and docked to the  ports.  Examination of the abdominal cavity noted a right inguinal hernia, with portion of the cecum within the defect.  Loose adhesions of the colon to the peritoneum removed to facilitate better creation of the peritoneal flap.  A peritoneal flap was created approximately 8cm cephalad to the defect by using scissors with electrocautery.  Dissection was carried down towards the pubic tubercle, developing the myopectineal orifice view.  Laterally the flap was carried towards the ASIS.  Large and scarred hernia sac was noted, which carefully dissected away from the adjacent tissues to be fully reduced out of hernia cavity.  Any bleeding was controlled with combination of electrocautery and manual pressure.    After confirming adequate dissection and the peritoneal reflection completely down and away from the cord structures, a Large Bard 3DMax medium weight mesh was placed within the anterior abdominal wall, secured in place using 2-0 Vicryl on an SH needle immediately above the pubic tubercle.  After noting proper placement of the mesh with the peritoneal reflection deep to it, the previously created peritoneal flap was secured back up to the anterior abdominal wall using running 3-0 V-Lock.  Portion of the hernia sac incorporated into the closure to further prevent recurrence.  Both needles were then removed out of the abdominal cavity, Xi platform undocked from the ports and removed off of operative field.  exparel infused as ilioinguinal block.  Abdomen then desufflated and ports removed. All the skin incisions were then closed with a subcuticular stitch of Monocryl 4-0. Dermabond was applied. The testis was gently pulled down into its anatomic position in the scrotum.  The patient tolerated the procedure well and was taken to  the postanesthesia care unit in stable condition. Sponge and instrument count correct at end of procedure.

## 2024-02-03 ENCOUNTER — Encounter: Payer: Self-pay | Admitting: Surgery

## 2024-02-13 ENCOUNTER — Encounter: Payer: Self-pay | Admitting: Internal Medicine

## 2024-02-26 ENCOUNTER — Ambulatory Visit: Admitting: Certified Registered"

## 2024-02-26 ENCOUNTER — Encounter
Admission: RE | Disposition: A | Discharge status: Home / Self Care | Payer: Self-pay | Source: Home / Self Care | Attending: Internal Medicine

## 2024-02-26 ENCOUNTER — Ambulatory Visit
Admission: RE | Admit: 2024-02-26 | Discharge: 2024-02-26 | Disposition: A | Discharge status: Home / Self Care | Payer: Medicare Other | Attending: Internal Medicine | Admitting: Internal Medicine

## 2024-02-26 DIAGNOSIS — Z8673 Personal history of transient ischemic attack (TIA), and cerebral infarction without residual deficits: Secondary | ICD-10-CM | POA: Diagnosis not present

## 2024-02-26 DIAGNOSIS — Z1211 Encounter for screening for malignant neoplasm of colon: Secondary | ICD-10-CM | POA: Diagnosis present

## 2024-02-26 DIAGNOSIS — D122 Benign neoplasm of ascending colon: Secondary | ICD-10-CM | POA: Insufficient documentation

## 2024-02-26 DIAGNOSIS — K5730 Diverticulosis of large intestine without perforation or abscess without bleeding: Secondary | ICD-10-CM | POA: Insufficient documentation

## 2024-02-26 DIAGNOSIS — I2510 Atherosclerotic heart disease of native coronary artery without angina pectoris: Secondary | ICD-10-CM | POA: Diagnosis not present

## 2024-02-26 DIAGNOSIS — I110 Hypertensive heart disease with heart failure: Secondary | ICD-10-CM | POA: Insufficient documentation

## 2024-02-26 DIAGNOSIS — K640 First degree hemorrhoids: Secondary | ICD-10-CM | POA: Diagnosis not present

## 2024-02-26 DIAGNOSIS — I5022 Chronic systolic (congestive) heart failure: Secondary | ICD-10-CM | POA: Insufficient documentation

## 2024-02-26 HISTORY — DX: Chronic systolic (congestive) heart failure: I50.22

## 2024-02-26 HISTORY — PX: COLONOSCOPY: SHX5424

## 2024-02-26 SURGERY — COLONOSCOPY
Anesthesia: General

## 2024-02-26 MED ORDER — LIDOCAINE HCL (CARDIAC) PF 100 MG/5ML IV SOSY
PREFILLED_SYRINGE | INTRAVENOUS | Status: DC | PRN
  Administered 2024-02-26: 100 mg via INTRAVENOUS

## 2024-02-26 MED ORDER — SODIUM CHLORIDE 0.9 % IV SOLN
INTRAVENOUS | Status: DC
  Administered 2024-02-26: 20 mL/h via INTRAVENOUS

## 2024-02-26 MED ORDER — PROPOFOL 10 MG/ML IV BOLUS
INTRAVENOUS | Status: DC | PRN
  Administered 2024-02-26: 150 ug/kg/min via INTRAVENOUS
  Administered 2024-02-26: 50 mg via INTRAVENOUS

## 2024-02-26 NOTE — Interval H&P Note (Signed)
 History and Physical Interval Note:  02/26/2024 8:34 AM  Jimmy Montoya  has presented today for surgery, with the diagnosis of Colon cancer screening (Z12.11).  The various methods of treatment have been discussed with the patient and family. After consideration of risks, benefits and other options for treatment, the patient has consented to  Procedure(s): COLONOSCOPY (N/A) as a surgical intervention.  The patient's history has been reviewed, patient examined, no change in status, stable for surgery.  I have reviewed the patient's chart and labs.  Questions were answered to the patient's satisfaction.     Mifflin, Jimmy Montoya

## 2024-02-26 NOTE — Anesthesia Postprocedure Evaluation (Signed)
 Anesthesia Post Note  Patient: Jimmy Montoya  Procedure(s) Performed: COLONOSCOPY  Patient location during evaluation: Endoscopy Anesthesia Type: General Level of consciousness: awake and alert Pain management: pain level controlled Vital Signs Assessment: post-procedure vital signs reviewed and stable Respiratory status: spontaneous breathing, nonlabored ventilation, respiratory function stable and patient connected to nasal cannula oxygen Cardiovascular status: blood pressure returned to baseline and stable Postop Assessment: no apparent nausea or vomiting Anesthetic complications: no  There were no known notable events for this encounter.   Last Vitals:  Vitals:   02/26/24 0946 02/26/24 0953  BP: (!) 147/82 (!) 163/87  Pulse: (!) 57 60  Resp: 16 15  Temp:    SpO2: 100% 100%    Last Pain:  Vitals:   02/26/24 0953  TempSrc:   PainSc: 0-No pain                 Enrique Harvest

## 2024-02-26 NOTE — Op Note (Signed)
 [facility] Gastroenterology Patient Name: Jimmy Montoya Procedure Date: 02/26/2024 9:04 AM MRN: [id number] Account #: [id number] Date of Birth: [birth year] Admit Type: Outpatient Age: 68 Room: [facility] ENDO ROOM 1 Gender: Male Note Status: Finalized Instrument Name: Charlyn Cooley [id number] Procedure:             Colonoscopy Indications:           Screening for colorectal malignant neoplasm Providers:             Fredrika Canby K. Corky Diener MD, MD Referring MD:          Shaniyah Wix K. Corky Diener MD, MD (Referring MD) Medicines:             Propofol  per Anesthesia Complications:         No immediate complications. Estimated blood loss:                         Minimal. Procedure:             Pre-Anesthesia Assessment:                        - The risks and benefits of the procedure and the                         sedation options and risks were discussed with the                         patient. All questions were answered and informed                         consent was obtained.                        - Patient identification and proposed procedure were                         verified prior to the procedure by the nurse. The                         procedure was verified in the procedure room.                        - ASA Grade Assessment: III - A patient with severe                         systemic disease.                        - After reviewing the risks and benefits, the patient                         was deemed in satisfactory condition to undergo the                         procedure.                        After obtaining informed consent, the colonoscope was                         passed under  direct vision. Throughout the procedure,                         the patient's blood pressure, pulse, and oxygen                         saturations were monitored continuously. The                         Colonoscope was introduced through the anus and                         advanced to  the the cecum, identified by appendiceal                         orifice and ileocecal valve. The colonoscopy was                         performed without difficulty. The patient tolerated                         the procedure well. The quality of the bowel                         preparation was adequate. The ileocecal valve,                         appendiceal orifice, and rectum were photographed. Findings:      The perianal and digital rectal examinations were normal. Pertinent       negatives include normal sphincter tone and no palpable rectal lesions.      Non-bleeding internal hemorrhoids were found during retroflexion. The       hemorrhoids were Grade I (internal hemorrhoids that do not prolapse).      A few small-mouthed diverticula were found in the sigmoid colon.      A 3 mm polyp was found in the ascending colon. The polyp was sessile.       The polyp was removed with a cold biopsy forceps. Resection and       retrieval were complete. Estimated blood loss was minimal.      The exam was otherwise without abnormality. Impression:            - Non-bleeding internal hemorrhoids.                        - Diverticulosis in the sigmoid colon.                        - One 3 mm polyp in the ascending colon, removed with                         a cold biopsy forceps. Resected and retrieved.                        - The examination was otherwise normal. Recommendation:        - Patient has a contact number available for                         emergencies. The signs  and symptoms of potential                         delayed complications were discussed with the patient.                         Return to normal activities tomorrow. Written                         discharge instructions were provided to the patient.                        - Resume previous diet.                        - Continue present medications.                        - Repeat colonoscopy is recommended for  surveillance.                         The colonoscopy date will be determined after                         pathology results from today's exam become available                         for review.                        - Return to GI office PRN.                        - The findings and recommendations were discussed with                         the patient. Procedure Code(s):     --- Professional ---                        [phone], Colonoscopy, flexible; with biopsy, single or                         multiple Diagnosis Code(s):     --- Professional ---                        K57.30, Diverticulosis of large intestine without                         perforation or abscess without bleeding                        D12.2, Benign neoplasm of ascending colon                        K64.0, First degree hemorrhoids                        Z12.11, Encounter for screening for malignant neoplasm                         of colon CPT copyright  2022 American Medical [employer]. All rights reserved. The codes documented in this report are preliminary and upon coder review may  be revised to meet current compliance requirements. Cassie Click MD, MD 02/26/2024 9:34:22 AM This report has been signed electronically. Number of Addenda: 0 Note Initiated On: 02/26/2024 9:04 AM Scope Withdrawal Time: 0 hours 6 minutes 20 seconds  Total Procedure Duration: 0 hours 9 minutes 26 seconds  Estimated Blood Loss:  Estimated blood loss was minimal.      [facility]

## 2024-02-26 NOTE — Transfer of Care (Signed)
 Immediate Anesthesia Transfer of Care Note  Patient: Jimmy Montoya  Procedure(s) Performed: COLONOSCOPY  Patient Location: PACU  Anesthesia Type:General  Level of Consciousness: drowsy  Airway & Oxygen Therapy: Patient Spontanous Breathing  Post-op Assessment: Report given to RN and Post -op Vital signs reviewed and stable  Post vital signs: stable on room air in left lateral position  Last Vitals:  Vitals Value Taken Time  BP 131/74 02/26/24 0933  Temp    Pulse 59 02/26/24 0933  Resp 15 02/26/24 0933  SpO2 100 % 02/26/24 0933  Vitals shown include unfiled device data.  Last Pain:  Vitals:   02/26/24 0822  TempSrc: Temporal  PainSc: 0-No pain         Complications: No notable events documented.

## 2024-02-26 NOTE — Anesthesia Preprocedure Evaluation (Signed)
 Anesthesia Evaluation  Patient identified by MRN, date of birth, ID band Patient awake    Reviewed: Allergy & Precautions, NPO status , Patient's Chart, lab work & pertinent test results  History of Anesthesia Complications Negative for: history of anesthetic complications  Airway Mallampati: II  TM Distance: >3 FB Neck ROM: Full    Dental  (+) Edentulous Upper, Chipped   Pulmonary neg pulmonary ROS, neg sleep apnea, neg COPD, Patient abstained from smoking.Not current smoker   Pulmonary exam normal breath sounds clear to auscultation       Cardiovascular Exercise Tolerance: Good hypertension, + CAD and +CHF  (-) Past MI (-) dysrhythmias  Rhythm:Regular Rate:Bradycardia - Systolic murmurs Most recent TTE performed on 03/29/2023 revealed a mildly reduced  left ventricular systolic function with an EF of 45%.  Apical anterior, septal, anteroseptal, and inferoseptal walls all noted to be akinetic. Left ventricular diastolic Doppler parameters consistent with abnormal relaxation (G1DD). GLS -12.5%. Right ventricular size and function normal. There was trivial tricuspid and pulmonary, in addition to mild mitral valve regurgitation. All transvalvular gradients were noted to be normal providing no evidence suggestive of valvular stenosis. Aorta normal in size with no evidence of ectasia or aneurysmal dilatation.   Neuro/Psych CVA, No Residual Symptoms  negative psych ROS   GI/Hepatic ,neg GERD  ,,(+)     (-) substance abuse    Endo/Other  neg diabetes    Renal/GU      Musculoskeletal   Abdominal   Peds  Hematology   Anesthesia Other Findings Past Medical History: 03/21/2021: Acute cerebrovascular accident (CVA) due to ischemia [facility])     Comment:  a.) MRI brain 03/21/2021: 1 cm acute ischemic               nonhemorrhagic infarct involving the RIGHT               thalamocapsular region 03/20/2021: Anemia No date: Angina at  rest [facility]) No date: B12 deficiency No date: Bradycardia No date: CAD (coronary artery disease) No date: Cardiomyopathy (HCC) No date: ED (erectile dysfunction)     Comment:  a.) on PDE5i (sildenafil) No date: Grade I hemorrhoids No date: Hematuria 09/02/2015: Hernia, inguinal, right No date: HFrEF (heart failure with reduced ejection fraction) (HCC) No date: History of kidney stones No date: Hyperlipemia No date: Hypertension No date: 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 No date: Insomnia     Comment:  a.) takes trazadone PRN No date: Leukopenia No date: Long-term use of aspirin  therapy No date: Microscopic hematuria No date: 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 No date: Testosterone deficiency No date: Varicose vein of leg No date: Vitiligo  Reproductive/Obstetrics                             Anesthesia Physical Anesthesia Plan  ASA: 3  Anesthesia Plan: General   Post-op Pain Management: Minimal or no pain anticipated   Induction: Intravenous  PONV Risk Score and Plan: 3 and Propofol  infusion, TIVA and Ondansetron   Airway Management Planned: Nasal Cannula  Additional Equipment: None  Intra-op Plan:   Post-operative Plan:   Informed Consent: I have reviewed the patients History and Physical, chart, labs and discussed the procedure including the risks, benefits and alternatives for the proposed anesthesia with the patient or authorized representative who has indicated his/her understanding and acceptance.     Dental advisory given  Plan Discussed with: CRNA and  Surgeon  Anesthesia Plan Comments: (Discussed risks of anesthesia with patient, including possibility of difficulty with spontaneous ventilation under anesthesia necessitating airway intervention, PONV, and rare risks such as cardiac or respiratory or neurological events, and allergic reactions. Discussed the role of CRNA in patient's  perioperative care. Patient understands.)       Anesthesia Quick Evaluation

## 2024-02-26 NOTE — H&P (Signed)
 Outpatient short stay form Pre-procedure 02/26/2024 8:34 AM Jimmy Montoya K. Corky Diener, M.D.  Primary Physician: Eddy Goodell III, M.D.  Reason for visit:  Colon cancer screening  History of present illness:  Mr. Jimmy Montoya is a 68 year old male with hx of CHF presenting for need to set up colonoscopy for colon cancer screening. Patient previously saw me in August 2022 at which time we scheduled a colonoscopy but was never pursued and completed by the patient. He has a history of systolic CHF and CVA in 03/2021. Patient has a personal history of shoulder injury in March 2024 with resulting neuropathy prompting ongoing care by pain management MD who prescribes oxycodone  and narcan nasal spray, the latter as needed. Patient denies abdominal pain, change in bowel habits, rectal bleeding or involuntary weight loss. Patient has some c/o symptomatic right inguinal hernia s/p attempted repair several years ago by Dr. Hortensia Ma, but no resolution.     Current Facility-Administered Medications:    0.9 %  sodium chloride  infusion, , Intravenous, Continuous, Ramsey Guadamuz K, MD  Medications Prior to Admission  Medication Sig Dispense Refill Last Dose/Taking   acetaminophen  (TYLENOL ) 325 MG tablet Take 2 tablets (650 mg total) by mouth every 8 (eight) hours as needed for mild pain (pain score 1-3). 40 tablet 0 02/25/2024   aspirin  EC 81 MG tablet Take 81 mg by mouth daily.   02/25/2024   atorvastatin  (LIPITOR) 40 MG tablet Take 40 mg by mouth every morning.   02/25/2024   busPIRone (BUSPAR) 15 MG tablet Take 15 mg by mouth every morning.   02/25/2024   Cyanocobalamin (VITAMIN B-12 PO) Take 1 tablet by mouth daily.   02/25/2024   ibuprofen  (ADVIL ) 800 MG tablet Take 1 tablet (800 mg total) by mouth every 8 (eight) hours as needed for mild pain (pain score 1-3) or moderate pain (pain score 4-6). 30 tablet 0 Past Week   losartan (COZAAR) 25 MG tablet Take 25 mg by mouth every morning.   02/25/2024   metoprolol  tartrate  (LOPRESSOR ) 25 MG tablet Take 25 mg by mouth every morning.   02/25/2024   ondansetron  (ZOFRAN -ODT) 4 MG disintegrating tablet Take 1 tablet (4 mg total) by mouth every 8 (eight) hours as needed for nausea or vomiting. 20 tablet 0 02/25/2024   oxyCODONE -acetaminophen  (PERCOCET) 5-325 MG tablet Take 1 tablet by mouth every 8 (eight) hours as needed for severe pain (pain score 7-10). 6 tablet 0 02/25/2024   sildenafil (VIAGRA) 100 MG tablet Take 50 mg by mouth daily as needed for erectile dysfunction.   02/25/2024   spironolactone (ALDACTONE) 25 MG tablet Take 12.5 mg by mouth every morning.   02/25/2024   tamsulosin (FLOMAX) 0.4 MG CAPS capsule Take 0.4 mg by mouth as needed.   02/25/2024   traZODone (DESYREL) 50 MG tablet Take 50 mg by mouth daily as needed for sleep.   02/25/2024     No Known Allergies   Past Medical History:  Diagnosis Date   Acute cerebrovascular accident (CVA) due to ischemia (HCC) 03/21/2021   a.) MRI brain 03/21/2021: 1 cm acute ischemic nonhemorrhagic infarct involving the RIGHT thalamocapsular region   Anemia 03/20/2021   Angina at rest 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    Physical Exam  Gen: Alert, oriented. Appears stated age.   HEENT: [place]/AT. PERRLA. Lungs: CTA, no wheezes. CV: RR nl S1, S2. Abd: soft, benign, no masses. BS+ Ext: No edema. Pulses 2+    Planned procedures: Proceed with colonoscopy. The patient understands the nature of the planned procedure, indications, risks, alternatives and potential complications including but not limited to bleeding, infection, perforation, damage to internal organs and possible oversedation/side effects from anesthesia. The patient agrees and gives consent to proceed.  Please refer to procedure notes for findings, recommendations and patient disposition/instructions.     [name] K. Corky Diener, M.D. Gastroenterology 02/26/2024  8:34 AM

## 2024-02-27 ENCOUNTER — Encounter: Payer: Self-pay | Admitting: Internal Medicine

## 2024-02-28 LAB — SURGICAL PATHOLOGY
